# Patient Record
Sex: Female | Born: 1990
Health system: Southern US, Community
[De-identification: ages and names within clinical notes are randomized; demographics above are authoritative.]

## PROBLEM LIST (undated history)

## (undated) ENCOUNTER — Inpatient Hospital Stay (HOSPITAL_COMMUNITY): Payer: Self-pay

## (undated) DIAGNOSIS — F32A Depression, unspecified: Secondary | ICD-10-CM

## (undated) DIAGNOSIS — D219 Benign neoplasm of connective and other soft tissue, unspecified: Secondary | ICD-10-CM

## (undated) DIAGNOSIS — F329 Major depressive disorder, single episode, unspecified: Secondary | ICD-10-CM

## (undated) DIAGNOSIS — A599 Trichomoniasis, unspecified: Secondary | ICD-10-CM

## (undated) DIAGNOSIS — R87629 Unspecified abnormal cytological findings in specimens from vagina: Secondary | ICD-10-CM

## (undated) DIAGNOSIS — J45909 Unspecified asthma, uncomplicated: Secondary | ICD-10-CM

---
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
## 2009-03-20 ENCOUNTER — Emergency Department (HOSPITAL_COMMUNITY): Admission: EM | Admit: 2009-03-20 | Discharge: 2009-03-20 | Payer: Self-pay | Admitting: Family Medicine

## 2009-03-27 ENCOUNTER — Ambulatory Visit (HOSPITAL_COMMUNITY): Admission: RE | Admit: 2009-03-27 | Discharge: 2009-03-27 | Payer: Self-pay | Admitting: Obstetrics & Gynecology

## 2009-09-12 ENCOUNTER — Emergency Department (HOSPITAL_COMMUNITY): Admission: EM | Admit: 2009-09-12 | Discharge: 2009-09-12 | Payer: Self-pay | Admitting: Emergency Medicine

## 2010-03-21 LAB — URINE MICROSCOPIC-ADD ON

## 2010-03-21 LAB — URINE CULTURE
Colony Count: NO GROWTH
Culture  Setup Time: 201109071811
Culture: NO GROWTH

## 2010-03-21 LAB — URINALYSIS, ROUTINE W REFLEX MICROSCOPIC
Glucose, UA: NEGATIVE mg/dL
Hgb urine dipstick: NEGATIVE
Ketones, ur: 15 mg/dL — AB
Nitrite: NEGATIVE
Protein, ur: NEGATIVE mg/dL
Specific Gravity, Urine: 1.02 (ref 1.005–1.030)
Urobilinogen, UA: 2 mg/dL — ABNORMAL HIGH (ref 0.0–1.0)
pH: 6 (ref 5.0–8.0)

## 2010-03-21 LAB — WET PREP, GENITAL
Clue Cells Wet Prep HPF POC: NONE SEEN
Yeast Wet Prep HPF POC: NONE SEEN

## 2010-03-21 LAB — POCT PREGNANCY, URINE: Preg Test, Ur: NEGATIVE

## 2010-03-21 LAB — GC/CHLAMYDIA PROBE AMP, GENITAL
Chlamydia, DNA Probe: NEGATIVE
GC Probe Amp, Genital: POSITIVE — AB

## 2010-03-31 LAB — POCT URINALYSIS DIP (DEVICE)
Bilirubin Urine: NEGATIVE
Glucose, UA: NEGATIVE mg/dL
Hgb urine dipstick: NEGATIVE
Ketones, ur: NEGATIVE mg/dL
Nitrite: NEGATIVE
Protein, ur: 100 mg/dL — AB
Specific Gravity, Urine: 1.015 (ref 1.005–1.030)
Urobilinogen, UA: 1 mg/dL (ref 0.0–1.0)
pH: 8.5 — ABNORMAL HIGH (ref 5.0–8.0)

## 2010-03-31 LAB — POCT PREGNANCY, URINE
Preg Test, Ur: NEGATIVE
Preg Test, Ur: NEGATIVE

## 2010-04-11 LAB — POCT I-STAT, CHEM 8
BUN: 5 mg/dL — ABNORMAL LOW (ref 6–23)
Calcium, Ion: 1.18 mmol/L (ref 1.12–1.32)
Chloride: 99 mEq/L (ref 96–112)
Creatinine, Ser: 0.8 mg/dL (ref 0.4–1.2)
Glucose, Bld: 93 mg/dL (ref 70–99)
HCT: 34 % — ABNORMAL LOW (ref 36.0–46.0)
Hemoglobin: 11.6 g/dL — ABNORMAL LOW (ref 12.0–15.0)
Potassium: 3.7 mEq/L (ref 3.5–5.1)
Sodium: 138 mEq/L (ref 135–145)
TCO2: 26 mmol/L (ref 0–100)

## 2010-04-11 LAB — POCT URINALYSIS DIP (DEVICE)
Bilirubin Urine: NEGATIVE
Glucose, UA: NEGATIVE mg/dL
Ketones, ur: NEGATIVE mg/dL
Nitrite: NEGATIVE
Protein, ur: 30 mg/dL — AB
Specific Gravity, Urine: 1.015 (ref 1.005–1.030)
Urobilinogen, UA: 1 mg/dL (ref 0.0–1.0)
pH: 6 (ref 5.0–8.0)

## 2010-04-11 LAB — POCT PREGNANCY, URINE: Preg Test, Ur: NEGATIVE

## 2010-05-05 ENCOUNTER — Inpatient Hospital Stay (HOSPITAL_COMMUNITY)
Admission: AD | Admit: 2010-05-05 | Discharge: 2010-05-05 | Discharge status: Against Medical Advice | Payer: Medicaid Other | Source: Ambulatory Visit | Attending: Obstetrics | Admitting: Obstetrics

## 2010-05-05 DIAGNOSIS — N912 Amenorrhea, unspecified: Secondary | ICD-10-CM | POA: Insufficient documentation

## 2010-05-05 DIAGNOSIS — R110 Nausea: Secondary | ICD-10-CM | POA: Insufficient documentation

## 2010-05-05 LAB — POCT PREGNANCY, URINE: Preg Test, Ur: NEGATIVE

## 2010-05-05 LAB — URINALYSIS, ROUTINE W REFLEX MICROSCOPIC
Bilirubin Urine: NEGATIVE
Glucose, UA: NEGATIVE mg/dL
Hgb urine dipstick: NEGATIVE
Ketones, ur: NEGATIVE mg/dL
Nitrite: NEGATIVE
Protein, ur: NEGATIVE mg/dL
Specific Gravity, Urine: 1.02 (ref 1.005–1.030)
Urobilinogen, UA: 1 mg/dL (ref 0.0–1.0)
pH: 7 (ref 5.0–8.0)
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
## 2010-05-24 NOTE — Discharge Summary (Signed)
Kathy Flores, Kathy Flores             ACCOUNT NO.:  [id number]   MEDICAL RECORD NO.:  [id number]          PATIENT TYPE:  INP   LOCATION:  9112                          [facility]ACILITY:  [facility]   PHYSICIAN:  Conni Elliot, M.D.DATE OF BIRTH:  [birth year]   DATE OF ADMISSION:  11/11/2004  DATE OF DISCHARGE:  11/15/2004                                 DISCHARGE SUMMARY   DISCHARGE DIAGNOSES:  1.  Intrauterine pregnancy, term.  Normal spontaneous vaginal delivery at 37      and 6 weeks.  2.  Induction of labor for preeclampsia.   DISCHARGE MEDICATIONS:  None.   CONSULTS AND PROCEDURES:  None.   BRIEF HOSPITAL COURSE:  Patient is a 20 year old African-American female  patient who was seen at [facility] who came into the MAU after having  elevated blood pressures noted in the clinic.  She did state that she had  been having some headaches and she was admitted to antenatal for probable  preeclampsia and rule out preeclampsia laboratories.  PIH laboratories were  performed which noted to have a 24-hour urine which noted to have a volume  of 9.3 L with less than 6 g of protein.  However, she continued to have  elevated blood pressures and severe headache during admission during  antenatal for one day with blood pressures ranging in 150s/90s.  It was  decided to go ahead and induce the patient on November 12, 2004 for  preeclampsia.  Patient was induced with Cervidil inserted at 12:00 on the  7th and the Cervidil was removed at midnight.  It was noted that the patient  had one slight elevated temperature; however, this was most likely secondary  due to the prostaglandin induction.  Patient was started on magnesium as  well as low dose Pitocin for continuation of induction of labor.  Patient  went on to a spontaneous vaginal delivery at 1310 on November 13, 2004 of a  viable female infant with Apgars of 9 and 9.  She had a normal spontaneous  delivery of her placenta with a three vessel cord that  was intact.  She had  estimated blood loss of less than 300 mL.  She had no lacerations.  Patient  and infant were stable and patient was transferred to the adult ICU for  continuation of her magnesium for 24 hours after delivery of her baby.  Baby  was sent to the newborn nursery without complications.  Please see the  baby's discharge note.  Patient was continued in the ICU for approximately  36 hours.  She diuresed well and magnesium levels were continued at  therapeutic levels.  Patient denied any symptoms of pain or headache.  We  did note to have elevated blood pressures on day prior to discharge with the  greatest at 170/108 and labetalol was given.  However, after the medicines  her blood pressures continued to be in the 150s systolic and 80s diastolic.  Patient was transferred out of the ICU on November 15, 2004.  Her blood  pressures remained stable.  Patient denied any pain, any bleeding, no  headaches.  Reflexes were continued to be hyperreflexic.  Patient tolerated  hospitalization well and is now stable and ready for discharge.  Patient  will be followed up in [facility] in six weeks.  Patient was instructed  to occasionally have her blood pressure checked in between now and the six-  week check-up with noted to document blood pressures.  If she notes any  signs of headache she is to inform [facility] or the GYN Clinic for  further instructions.  Patient is rubella non-immune and was given a rubella  injection prior to discharge from the hospital.  She was GBS positive and  was treated with antibiotics throughout the delivery with penicillin.  She  was HIV nonreactive.  She was O+, negative antibody and her RPR was  nonreactive.  She was hepatitis B surface antigen negative.  Discharging  blood pressure were slightly elevated in the 140s-150s systolic and  diastolics in the 80s.  The patient will be advised to follow up in the GYN  Clinic for a  nurse's visit just for  monitoring of her blood pressures in one week.  Patient was seen by social work due to the fact that she is a teenage mom.  She has a maternal grandmother for support and she was given referral  information for community resources.      Barth Kirks, M.D.    ______________________________  Conni Elliot, M.D.    MB/MEDQ  D:  11/15/2004  T:  11/15/2004  Job:  [phone] [id number]

## 2010-07-15 ENCOUNTER — Emergency Department (HOSPITAL_COMMUNITY)
Admission: EM | Admit: 2010-07-15 | Discharge: 2010-07-16 | Disposition: A | Discharge status: Home / Self Care | Payer: Medicaid Other | Attending: Emergency Medicine | Admitting: Emergency Medicine

## 2010-07-15 DIAGNOSIS — H109 Unspecified conjunctivitis: Secondary | ICD-10-CM | POA: Insufficient documentation

## 2010-07-15 DIAGNOSIS — R599 Enlarged lymph nodes, unspecified: Secondary | ICD-10-CM | POA: Insufficient documentation

## 2010-07-15 DIAGNOSIS — J029 Acute pharyngitis, unspecified: Secondary | ICD-10-CM | POA: Insufficient documentation

## 2010-07-15 DIAGNOSIS — H5789 Other specified disorders of eye and adnexa: Secondary | ICD-10-CM | POA: Insufficient documentation

## 2010-07-15 DIAGNOSIS — H11419 Vascular abnormalities of conjunctiva, unspecified eye: Secondary | ICD-10-CM | POA: Insufficient documentation

## 2010-07-15 DIAGNOSIS — R509 Fever, unspecified: Secondary | ICD-10-CM | POA: Insufficient documentation

## 2010-07-15 DIAGNOSIS — R51 Headache: Secondary | ICD-10-CM | POA: Insufficient documentation

## 2010-07-16 LAB — RAPID STREP SCREEN (MED CTR MEBANE ONLY): Streptococcus, Group A Screen (Direct): NEGATIVE

## 2010-11-04 ENCOUNTER — Emergency Department (HOSPITAL_COMMUNITY): Payer: Medicaid Other

## 2010-11-04 ENCOUNTER — Emergency Department (HOSPITAL_COMMUNITY)
Admission: EM | Admit: 2010-11-04 | Discharge: 2010-11-05 | Disposition: A | Discharge status: Home / Self Care | Payer: Medicaid Other | Attending: Emergency Medicine | Admitting: Emergency Medicine

## 2010-11-04 DIAGNOSIS — R059 Cough, unspecified: Secondary | ICD-10-CM | POA: Insufficient documentation

## 2010-11-04 DIAGNOSIS — R05 Cough: Secondary | ICD-10-CM | POA: Insufficient documentation

## 2010-11-04 DIAGNOSIS — J40 Bronchitis, not specified as acute or chronic: Secondary | ICD-10-CM | POA: Insufficient documentation

## 2011-02-01 ENCOUNTER — Encounter (HOSPITAL_COMMUNITY): Payer: Self-pay | Admitting: *Deleted

## 2011-02-01 ENCOUNTER — Emergency Department (HOSPITAL_COMMUNITY)
Admission: EM | Admit: 2011-02-01 | Discharge: 2011-02-01 | Disposition: A | Discharge status: Home / Self Care | Payer: Medicaid Other | Attending: Emergency Medicine | Admitting: Emergency Medicine

## 2011-02-01 DIAGNOSIS — R079 Chest pain, unspecified: Secondary | ICD-10-CM | POA: Insufficient documentation

## 2011-02-01 DIAGNOSIS — R197 Diarrhea, unspecified: Secondary | ICD-10-CM | POA: Insufficient documentation

## 2011-02-01 DIAGNOSIS — R112 Nausea with vomiting, unspecified: Secondary | ICD-10-CM

## 2011-02-01 DIAGNOSIS — R109 Unspecified abdominal pain: Secondary | ICD-10-CM | POA: Insufficient documentation

## 2011-02-01 LAB — PREGNANCY, URINE: Preg Test, Ur: NEGATIVE

## 2011-02-01 MED ORDER — PANTOPRAZOLE SODIUM 40 MG PO TBEC: 40.0000 mg | DELAYED_RELEASE_TABLET | Freq: Every day | ORAL | Status: DC

## 2011-02-01 MED ORDER — ONDANSETRON 4 MG PO TBDP
8.0000 mg | ORAL_TABLET | Freq: Once | ORAL | Status: AC
  Administered 2011-02-01: 8 mg via ORAL
  Filled 2011-02-01: qty 2

## 2011-02-01 MED ORDER — ONDANSETRON HCL 8 MG PO TABS: 8.0000 mg | ORAL_TABLET | Freq: Three times a day (TID) | ORAL | Status: AC | PRN

## 2011-02-01 NOTE — ED Notes (Signed)
I gave the patient a cup of ice and a sprite. 

## 2011-02-01 NOTE — ED Provider Notes (Addendum)
History     CSN: [id number]  Arrival date & time 02/01/11  1612   First MD Initiated Contact with Patient 02/01/11 1744      Chief Complaint  Patient presents with  . Nausea  . Abdominal Pain  . Diarrhea  . Chest Pain    (Consider location/radiation/quality/duration/timing/severity/associated sxs/prior treatment) Patient is a 21 y.o. female presenting with abdominal pain, diarrhea, and chest pain. The history is provided by the patient.  Abdominal Pain The primary symptoms of the illness include abdominal pain and diarrhea. The primary symptoms of the illness do not include fever or shortness of breath.  Symptoms associated with the illness do not include chills or back pain.  Diarrhea The primary symptoms include abdominal pain and diarrhea. Primary symptoms do not include fever or rash.  The illness does not include chills or back pain.  Chest Pain Primary symptoms include abdominal pain. Pertinent negatives for primary symptoms include no fever and no shortness of breath.   pt c/o nvd onset last pm. Few episodes of each. Emesis clear, not bloody or bilious. Diarrhea loose to watery, not bloody. Mid abd crampy pain comes and goes, no constant or focal abd pain. No radiation. No specific exacerbating or alleviating factors.  No prior abd surgery. States her normal period started yesterday. No unusual vaginal discharge or bleeding. States w normal period will get cramping but not nvd. No known ill contacts or bad food ingestion. No recent new meds or abx use. No fever or chills. No gu c/o. No back or flank pain.   History reviewed. No pertinent past medical history.  History reviewed. No pertinent past surgical history.  No family history on file.  History  Substance Use Topics  . Smoking status: Current Everyday Smoker  . Smokeless tobacco: Not on file  . Alcohol Use: No    OB History    Grav Para Term Preterm Abortions TAB SAB Ect Mult Living                  Review  of Systems  Constitutional: Negative for fever and chills.  HENT: Negative for neck pain.   Eyes: Negative for redness.  Respiratory: Negative for shortness of breath.   Cardiovascular: Negative for chest pain and leg swelling.  Gastrointestinal: Positive for abdominal pain and diarrhea.  Genitourinary: Negative for flank pain.  Musculoskeletal: Negative for back pain.  Skin: Negative for rash.  Neurological: Negative for headaches.  Hematological: Does not bruise/bleed easily.  Psychiatric/Behavioral: Negative for confusion.    Allergies  Penicillins  Home Medications  No current outpatient prescriptions on file.  BP 112/66  Pulse 97  Temp(Src) 99.1 F (37.3 C) (Oral)  Resp 16  Ht 5\' 5"  (1.651 m)  Wt 165 lb (74.844 kg)  BMI 27.46 kg/m2  SpO2 99%  Physical Exam  Nursing note and vitals reviewed. Constitutional: She is oriented to person, place, and time. She appears well-developed and well-nourished. No distress.  Eyes: Conjunctivae are normal. No scleral icterus.  Neck: Normal range of motion. Neck supple. No tracheal deviation present.       No stiffness or rigidity  Cardiovascular: Normal rate, regular rhythm, normal heart sounds and intact distal pulses.  Exam reveals no gallop and no friction rub.   No murmur heard. Pulmonary/Chest: Effort normal and breath sounds normal. No respiratory distress.  Abdominal: Soft. Normal appearance and bowel sounds are normal. She exhibits no distension and no mass. There is no tenderness. There is no rebound and  no guarding.  Genitourinary:       No cva tenderness  Musculoskeletal: She exhibits no edema and no tenderness.  Neurological: She is alert and oriented to person, place, and time.  Skin: Skin is warm and dry. No rash noted.  Psychiatric: She has a normal mood and affect.    ED Course  Procedures (including critical care time)  Results for orders placed during the hospital encounter of 02/01/11  PREGNANCY, URINE       Component Value Range   Preg Test, Ur NEGATIVE  NEGATIVE       MDM  zofran po. Po fluids.   Tolerating po fluids. No nvd in ed. abd soft nt.   Recheck tolerating po fluids. Requests d/c. Pt also states has hx reflux and requests med for same.     Suzi Roots, MD 02/01/11 [place]  Suzi Roots, MD 02/01/11 [phone]

## 2011-02-01 NOTE — ED Notes (Signed)
Patient reports she has nausea/vomitting, diarrhea, stomach pain, chest pain, and she is on her period.  Her sx started last night

## 2011-06-06 ENCOUNTER — Emergency Department (HOSPITAL_COMMUNITY)
Admission: EM | Admit: 2011-06-06 | Discharge: 2011-06-06 | Disposition: A | Discharge status: Home / Self Care | Payer: Self-pay | Attending: Emergency Medicine | Admitting: Emergency Medicine

## 2011-06-06 ENCOUNTER — Encounter (HOSPITAL_COMMUNITY): Payer: Self-pay | Admitting: Emergency Medicine

## 2011-06-06 DIAGNOSIS — N72 Inflammatory disease of cervix uteri: Secondary | ICD-10-CM | POA: Insufficient documentation

## 2011-06-06 DIAGNOSIS — N390 Urinary tract infection, site not specified: Secondary | ICD-10-CM | POA: Insufficient documentation

## 2011-06-06 LAB — WET PREP, GENITAL

## 2011-06-06 LAB — URINALYSIS, ROUTINE W REFLEX MICROSCOPIC
Bilirubin Urine: NEGATIVE
Hgb urine dipstick: NEGATIVE
Nitrite: NEGATIVE
Specific Gravity, Urine: 1.021 (ref 1.005–1.030)
Urobilinogen, UA: 0.2 mg/dL (ref 0.0–1.0)
pH: 7.5 (ref 5.0–8.0)

## 2011-06-06 LAB — URINE MICROSCOPIC-ADD ON

## 2011-06-06 LAB — POCT PREGNANCY, URINE: Preg Test, Ur: NEGATIVE

## 2011-06-06 MED ORDER — LIDOCAINE HCL (PF) 1 % IJ SOLN
INTRAMUSCULAR | Status: AC
  Administered 2011-06-06: 15:00:00
  Filled 2011-06-06: qty 5

## 2011-06-06 MED ORDER — AZITHROMYCIN 250 MG PO TABS
1000.0000 mg | ORAL_TABLET | Freq: Once | ORAL | Status: AC
  Administered 2011-06-06: 1000 mg via ORAL
  Filled 2011-06-06: qty 4

## 2011-06-06 MED ORDER — CEFTRIAXONE SODIUM 250 MG IJ SOLR
250.0000 mg | Freq: Once | INTRAMUSCULAR | Status: AC
  Administered 2011-06-06: 250 mg via INTRAMUSCULAR
  Filled 2011-06-06: qty 250

## 2011-06-06 MED ORDER — METRONIDAZOLE 500 MG PO TABS
2000.0000 mg | ORAL_TABLET | Freq: Once | ORAL | Status: AC
  Administered 2011-06-06: 2000 mg via ORAL
  Filled 2011-06-06: qty 4

## 2011-06-06 MED ORDER — SULFAMETHOXAZOLE-TRIMETHOPRIM 800-160 MG PO TABS: 1.0000 | ORAL_TABLET | Freq: Two times a day (BID) | ORAL | Status: AC

## 2011-06-06 NOTE — ED Provider Notes (Signed)
Medical screening examination/treatment/procedure(s) were performed by non-physician practitioner and as supervising physician I was immediately available for consultation/collaboration.  Johan Creveling R. Brylen Wagar, MD 06/06/11 1600 

## 2011-06-06 NOTE — ED Notes (Signed)
Onset two weeks ago LLQ abdominal pain with dysuria and vaginal discharge. Pain 5/10 achy dull

## 2011-06-06 NOTE — ED Provider Notes (Signed)
History     CSN: [id number]  Arrival date & time 06/06/11  1246   First MD Initiated Contact with Patient 06/06/11 1259      Chief Complaint  Patient presents with  . Abdominal Pain    (Consider location/radiation/quality/duration/timing/severity/associated sxs/prior treatment) Patient is a 21 y.o. female presenting with abdominal pain. The history is provided by the patient.  Abdominal Pain The primary symptoms of the illness include abdominal pain, dysuria and vaginal discharge. The primary symptoms of the illness do not include fever, nausea, vomiting, diarrhea or vaginal bleeding. The current episode started more than 2 days ago. The onset of the illness was gradual. The problem has been gradually worsening.  The dysuria is associated with frequency.  The vaginal discharge is associated with dysuria.   Additional symptoms associated with the illness include frequency. Symptoms associated with the illness do not include chills or back pain.  Pt states symptoms progressively worsening over last 2 weeks. Reports white vaginal discharge, pain with urination, urinary frequency. Pain suprapubic and left lower quadrant. Denies fever, chills, vomiting, diarrhea  History reviewed. No pertinent past medical history.  History reviewed. No pertinent past surgical history.  No family history on file.  History  Substance Use Topics  . Smoking status: Current Everyday Smoker  . Smokeless tobacco: Not on file  . Alcohol Use: No    OB History    Grav Para Term Preterm Abortions TAB SAB Ect Mult Living                  Review of Systems  Constitutional: Negative for fever and chills.  Respiratory: Negative.   Cardiovascular: Negative.   Gastrointestinal: Positive for abdominal pain. Negative for nausea, vomiting and diarrhea.  Genitourinary: Positive for dysuria, frequency and vaginal discharge. Negative for vaginal bleeding.  Musculoskeletal: Negative for back pain.  Skin:  Negative.   Neurological: Negative for dizziness and weakness.  Psychiatric/Behavioral: Negative.     Allergies  Penicillins  Home Medications  No current outpatient prescriptions on file.  BP 120/69  Pulse 84  Temp(Src) 98.8 F (37.1 C) (Oral)  Resp 16  SpO2 98%  Physical Exam  Nursing note and vitals reviewed. Constitutional: She is oriented to person, place, and time. She appears well-developed and well-nourished. No distress.  HENT:  Head: Normocephalic.  Eyes: Conjunctivae are normal.  Neck: Neck supple.  Cardiovascular: Normal rate, regular rhythm and normal heart sounds.   Pulmonary/Chest: Breath sounds normal. No respiratory distress. She has no wheezes. She has no rales.  Abdominal: Soft. Bowel sounds are normal. She exhibits no distension.       Suprapubic and left lower quadrant tenderness. No guarding, no rebound tenderness  Genitourinary:       Normal external genitalia. White vaginal discharge, Cervix normal. No CMT. Uterine and left adnexal  tenderness  Neurological: She is alert and oriented to person, place, and time.  Skin: Skin is warm and dry.  Psychiatric: She has a normal mood and affect.    ED Course  Procedures (including critical care time)  Pt with urinary symptoms and vaginal discharge. Chart review shows pt has hx of STI s and UTIs. Will get ua and do pelvic exam. Abdomen soft, mild tenderness in LLQ but no guarding, no rebound.   Results for orders placed during the hospital encounter of 06/06/11  URINALYSIS, ROUTINE W REFLEX MICROSCOPIC      Component Value Range   Color, Urine YELLOW  YELLOW    APPearance CLEAR  CLEAR    Specific Gravity, Urine 1.021  1.005 - 1.030    pH 7.5  5.0 - 8.0    Glucose, UA NEGATIVE  NEGATIVE (mg/dL)   Hgb urine dipstick NEGATIVE  NEGATIVE    Bilirubin Urine NEGATIVE  NEGATIVE    Ketones, ur NEGATIVE  NEGATIVE (mg/dL)   Protein, ur NEGATIVE  NEGATIVE (mg/dL)   Urobilinogen, UA 0.2  0.0 - 1.0 (mg/dL)    Nitrite NEGATIVE  NEGATIVE    Leukocytes, UA LARGE (*) NEGATIVE   POCT PREGNANCY, URINE      Component Value Range   Preg Test, Ur NEGATIVE  NEGATIVE   WET PREP, GENITAL      Component Value Range   Yeast Wet Prep HPF POC NONE SEEN  NONE SEEN    Trich, Wet Prep FEW (*) NONE SEEN    Clue Cells Wet Prep HPF POC FEW (*) NONE SEEN    WBC, Wet Prep HPF POC MANY (*) NONE SEEN   URINE MICROSCOPIC-ADD ON      Component Value Range   Squamous Epithelial / LPF FEW (*) RARE    WBC, UA 11-20  <3 (WBC/hpf)   RBC / HPF 0-2  <3 (RBC/hpf)   Bacteria, UA RARE  RARE    No results found.  Pt with possible UTI and cervicitis. WIll treat with flagyl, rocephin, zithromax for trich and Gonorrhea/chlamydia. Cultures sent. Will d/c home with antibiotic for UTI. Will recheck at health dept or PCP. Abdomen remains soft, mild left adnexal tenderness. Pt however is afebrile and non toxic, doubt TOA or PID, will follow up or return if worsening.   1. Cervicitis   2. UTI (lower urinary tract infection)       MDM          Lottie Mussel, PA 06/06/11 1515

## 2011-06-06 NOTE — ED Notes (Signed)
Pt states it hurts too bad to void. In & out cath ordered per pt request

## 2011-06-06 NOTE — ED Notes (Signed)
Pt request pelvic exam while here in ED

## 2011-06-06 NOTE — ED Notes (Signed)
Patient requesting HIV Aids test.

## 2011-06-06 NOTE — Discharge Instructions (Signed)
You were treated today for possible vaginal infection. You also have a urinary tract infection. Take antibiotic that was prescribed until all gone. Follow up with health dept or primary care doctor for recheck in 1 week. No intercourse for a week.   Cervicitis Cervicitis is a soreness and swelling (inflammation) of the cervix. Your cervix is located at the bottom of your uterus which opens up to the vagina.  CAUSES   Sexually transmitted infections (STIs).   Allergic reaction.   Medicines or birth control devices that are put in the vagina.   Injury to the cervix.   Bacterial infections.  SYMPTOMS  There may be no symptoms. If symptoms occur, they may include:  Grey, white, yellow, or bad smelling vaginal discharge.   Pain or itching of the area outside the vagina.   Painful sexual intercourse.   Lower abdominal or lower back pain, especially during intercourse.   Frequent urination.   Abnormal vaginal bleeding between periods, after sexual intercourse, or after menopause.   Pressure or a heavy feeling in the pelvis.  DIAGNOSIS  Diagnosis is made after a pelvic exam. Other tests may include:  Examination of any discharge under a microscope (wet prep).   A Pap test.  TREATMENT  Treatment will depend on the cause of cervicitis. If it is caused by an STI, both you and your partner will need to be treated. Antibiotic medicines will be given. HOME CARE INSTRUCTIONS   Do not have sexual intercourse until your caregiver says it is okay.   Do not have sexual intercourse until your partner has been treated if your cervicitis is caused by an STI.   Take your antibiotics as directed. Finish them even if you start to feel better.  SEEK IMMEDIATE MEDICAL CARE IF:   Your symptoms come back.   You have a fever.   You experience any problems that may be related to the medicine you are taking.  MAKE SURE YOU:   Understand these instructions.   Will watch your condition.    Will get help right away if you are not doing well or get worse.  Document Released: 12/23/2004 Document Revised: 12/12/2010 Document Reviewed: 07/22/2010 [facility] Patient Information 2012 [place], [place].

## 2011-06-07 LAB — GC/CHLAMYDIA PROBE AMP, GENITAL
Chlamydia, DNA Probe: NEGATIVE
GC Probe Amp, Genital: NEGATIVE

## 2013-04-25 LAB — OB RESULTS CONSOLE GC/CHLAMYDIA: Chlamydia: NEGATIVE

## 2013-04-25 LAB — OB RESULTS CONSOLE ABO/RH: RH TYPE: POSITIVE

## 2013-04-25 LAB — OB RESULTS CONSOLE HEPATITIS B SURFACE ANTIGEN: HEP B S AG: NEGATIVE

## 2013-04-25 LAB — OB RESULTS CONSOLE RUBELLA ANTIBODY, IGM: Rubella: IMMUNE

## 2013-04-25 LAB — OB RESULTS CONSOLE ANTIBODY SCREEN: ANTIBODY SCREEN: NEGATIVE

## 2013-04-25 LAB — OB RESULTS CONSOLE RPR: RPR: NONREACTIVE

## 2013-04-25 LAB — OB RESULTS CONSOLE HIV ANTIBODY (ROUTINE TESTING): HIV: NONREACTIVE

## 2013-05-05 DIAGNOSIS — D259 Leiomyoma of uterus, unspecified: Secondary | ICD-10-CM | POA: Insufficient documentation

## 2013-08-04 ENCOUNTER — Encounter (HOSPITAL_COMMUNITY): Payer: Self-pay

## 2013-08-04 ENCOUNTER — Inpatient Hospital Stay (HOSPITAL_COMMUNITY)
Admission: AD | Admit: 2013-08-04 | Discharge: 2013-08-04 | Disposition: A | Discharge status: Home / Self Care | Payer: Medicaid Other | Source: Ambulatory Visit | Attending: Obstetrics and Gynecology | Admitting: Obstetrics and Gynecology

## 2013-08-04 DIAGNOSIS — O9989 Other specified diseases and conditions complicating pregnancy, childbirth and the puerperium: Principal | ICD-10-CM

## 2013-08-04 DIAGNOSIS — O99330 Smoking (tobacco) complicating pregnancy, unspecified trimester: Secondary | ICD-10-CM | POA: Diagnosis not present

## 2013-08-04 DIAGNOSIS — R0602 Shortness of breath: Secondary | ICD-10-CM | POA: Diagnosis present

## 2013-08-04 DIAGNOSIS — O99891 Other specified diseases and conditions complicating pregnancy: Secondary | ICD-10-CM | POA: Diagnosis not present

## 2013-08-04 DIAGNOSIS — J45909 Unspecified asthma, uncomplicated: Secondary | ICD-10-CM | POA: Insufficient documentation

## 2013-08-04 DIAGNOSIS — J4540 Moderate persistent asthma, uncomplicated: Secondary | ICD-10-CM

## 2013-08-04 MED ORDER — BUDESONIDE 180 MCG/ACT IN AEPB: 1.0000 | INHALATION_SPRAY | Freq: Two times a day (BID) | RESPIRATORY_TRACT | Status: DC

## 2013-08-04 NOTE — MAU Note (Signed)
Pt presents complaining of shortness of breath that has been going on for a month. History of asthma but inhaler doesn't help. States she believes it is anxiety. Denies vaginal bleeding or discharge. Reports good fetal movement.

## 2013-08-04 NOTE — MAU Provider Note (Signed)
Attestation of Attending Supervision of Advanced Practitioner (CNM/NP): Evaluation and management procedures were performed by the Advanced Practitioner under my supervision and collaboration.  I have reviewed the Advanced Practitioner's note and chart, and I agree with the management and plan.  Francella Barnett 08/04/2013 8:39 PM

## 2013-08-04 NOTE — Discharge Instructions (Signed)
Asthma Attack Prevention Although there is no way to prevent asthma from starting, you can take steps to control the disease and reduce its symptoms. Learn about your asthma and how to control it. Take an active role to control your asthma by working with your health care provider to create and follow an asthma action plan. An asthma action plan guides you in:  Taking your medicines properly.  Avoiding things that set off your asthma or make your asthma worse (asthma triggers).  Tracking your level of asthma control.  Responding to worsening asthma.  Seeking emergency care when needed. To track your asthma, keep records of your symptoms, check your peak flow number using a handheld device that shows how well air moves out of your lungs (peak flow meter), and get regular asthma checkups.  WHAT ARE SOME WAYS TO PREVENT AN ASTHMA ATTACK?  Take medicines as directed by your health care provider.  Keep track of your asthma symptoms and level of control.  With your health care provider, write a detailed plan for taking medicines and managing an asthma attack. Then be sure to follow your action plan. Asthma is an ongoing condition that needs regular monitoring and treatment.  Identify and avoid asthma triggers. Many outdoor allergens and irritants (such as pollen, mold, cold air, and air pollution) can trigger asthma attacks. Find out what your asthma triggers are and take steps to avoid them.  Monitor your breathing. Learn to recognize warning signs of an attack, such as coughing, wheezing, or shortness of breath. Your lung function may decrease before you notice any signs or symptoms, so regularly measure and record your peak airflow with a home peak flow meter.  Identify and treat attacks early. If you act quickly, you are less likely to have a severe attack. You will also need less medicine to control your symptoms. When your peak flow measurements decrease and alert you to an upcoming attack,  take your medicine as instructed and immediately stop any activity that may have triggered the attack. If your symptoms do not improve, get medical help.  Pay attention to increasing quick-relief inhaler use. If you find yourself relying on your quick-relief inhaler, your asthma is not under control. See your health care provider about adjusting your treatment. WHAT CAN MAKE MY SYMPTOMS WORSE? A number of common things can set off or make your asthma symptoms worse and cause temporary increased inflammation of your airways. Keep track of your asthma symptoms for several weeks, detailing all the environmental and emotional factors that are linked with your asthma. When you have an asthma attack, go back to your asthma diary to see which factor, or combination of factors, might have contributed to it. Once you know what these factors are, you can take steps to control many of them. If you have allergies and asthma, it is important to take asthma prevention steps at home. Minimizing contact with the substance to which you are allergic will help prevent an asthma attack. Some triggers and ways to avoid these triggers are: Animal Dander:  Some people are allergic to the flakes of skin or dried saliva from animals with fur or feathers.   There is no such thing as a hypoallergenic dog or cat breed. All dogs or cats can cause allergies, even if they don't shed.  Keep these pets out of your home.  If you are not able to keep a pet outdoors, keep the pet out of your bedroom and other sleeping areas at all  times, and keep the door closed.  Remove carpets and furniture covered with cloth from your home. If that is not possible, keep the pet away from fabric-covered furniture and carpets. Dust Mites: Many people with asthma are allergic to dust mites. Dust mites are tiny bugs that are found in every home in mattresses, pillows, carpets, fabric-covered furniture, bedcovers, clothes, stuffed toys, and other  fabric-covered items.   Cover your mattress in a special dust-proof cover.  Cover your pillow in a special dust-proof cover, or wash the pillow each week in hot water. Water must be hotter than 130 F (54.4 C) to kill dust mites. Cold or warm water used with detergent and bleach can also be effective.  Wash the sheets and blankets on your bed each week in hot water.  Try not to sleep or lie on cloth-covered cushions.  Call ahead when traveling and ask for a smoke-free hotel room. Bring your own bedding and pillows in case the hotel only supplies feather pillows and down comforters, which may contain dust mites and cause asthma symptoms.  Remove carpets from your bedroom and those laid on concrete, if you can.  Keep stuffed toys out of the bed, or wash the toys weekly in hot water or cooler water with detergent and bleach. Cockroaches: Many people with asthma are allergic to the droppings and remains of cockroaches.   Keep food and garbage in closed containers. Never leave food out.  Use poison baits, traps, powders, gels, or paste (for example, boric acid).  If a spray is used to kill cockroaches, stay out of the room until the odor goes away. Indoor Mold:  Fix leaky faucets, pipes, or other sources of water that have mold around them.  Clean floors and moldy surfaces with a fungicide or diluted bleach.  Avoid using humidifiers, vaporizers, or swamp coolers. These can spread molds through the air. Pollen and Outdoor Mold:  When pollen or mold spore counts are high, try to keep your windows closed.  Stay indoors with windows closed from late morning to afternoon. Pollen and some mold spore counts are highest at that time.  Ask your health care provider whether you need to take anti-inflammatory medicine or increase your dose of the medicine before your allergy season starts. Other Irritants to Avoid:  Tobacco smoke is an irritant. If you smoke, ask your health care provider how  you can quit. Ask family members to quit smoking, too. Do not allow smoking in your home or car.  If possible, do not use a wood-burning stove, kerosene heater, or fireplace. Minimize exposure to all sources of smoke, including incense, candles, fires, and fireworks.  Try to stay away from strong odors and sprays, such as perfume, talcum powder, hair spray, and paints.  Decrease humidity in your home and use an indoor air cleaning device. Reduce indoor humidity to below 60%. Dehumidifiers or central air conditioners can do this.  Decrease house dust exposure by changing furnace and air cooler filters frequently.  Try to have someone else vacuum for you once or twice a week. Stay out of rooms while they are being vacuumed and for a short while afterward.  If you vacuum, use a dust mask from a hardware store, a double-layered or microfilter vacuum cleaner bag, or a vacuum cleaner with a HEPA filter.  Sulfites in foods and beverages can be irritants. Do not drink beer or wine or eat dried fruit, processed potatoes, or shrimp if they cause asthma symptoms.  Cold  air can trigger an asthma attack. Cover your nose and mouth with a scarf on cold or windy days.  Several health conditions can make asthma more difficult to manage, including a runny nose, sinus infections, reflux disease, psychological stress, and sleep apnea. Work with your health care provider to manage these conditions.  Avoid close contact with people who have a respiratory infection such as a cold or the flu, since your asthma symptoms may get worse if you catch the infection. Wash your hands thoroughly after touching items that may have been handled by people with a respiratory infection.  Get a flu shot every year to protect against the flu virus, which often makes asthma worse for days or weeks. Also get a pneumonia shot if you have not previously had one. Unlike the flu shot, the pneumonia shot does not need to be given  yearly. Medicines:  Talk to your health care provider about whether it is safe for you to take aspirin or non-steroidal anti-inflammatory medicines (NSAIDs). In a small number of people with asthma, aspirin and NSAIDs can cause asthma attacks. These medicines must be avoided by people who have known aspirin-sensitive asthma. It is important that people with aspirin-sensitive asthma read labels of all over-the-counter medicines used to treat pain, colds, coughs, and fever.  Beta-blockers and ACE inhibitors are other medicines you should discuss with your health care provider. HOW CAN I FIND OUT WHAT I AM ALLERGIC TO? Ask your asthma health care provider about allergy skin testing or blood testing (the RAST test) to identify the allergens to which you are sensitive. If you are found to have allergies, the most important thing to do is to try to avoid exposure to any allergens that you are sensitive to as much as possible. Other treatments for allergies, such as medicines and allergy shots (immunotherapy) are available.  CAN I EXERCISE? Follow your health care provider's advice regarding asthma treatment before exercising. It is important to maintain a regular exercise program, but vigorous exercise or exercise in cold, humid, or dry environments can cause asthma attacks, especially for those people who have exercise-induced asthma. Document Released: 12/11/2008 Document Revised: 12/28/2012 Document Reviewed: 06/30/2012 [facility] Patient Information 2015 [place], [place]. This information is not intended to replace advice given to you by your health care provider. Make sure you discuss any questions you have with your health care provider.  Asthma Asthma is a recurring condition in which the airways tighten and narrow. Asthma can make it difficult to breathe. It can cause coughing, wheezing, and shortness of breath. Asthma episodes, also called asthma attacks, range from minor to life-threatening. Asthma  cannot be cured, but medicines and lifestyle changes can help control it. CAUSES Asthma is believed to be caused by inherited (genetic) and environmental factors, but its exact cause is unknown. Asthma may be triggered by allergens, lung infections, or irritants in the air. Asthma triggers are different for each person. Common triggers include:   Animal dander.  Dust mites.  Cockroaches.  Pollen from trees or grass.  Mold.  Smoke.  Air pollutants such as dust, household cleaners, hair sprays, aerosol sprays, paint fumes, strong chemicals, or strong odors.  Cold air, weather changes, and winds (which increase molds and pollens in the air).  Strong emotional expressions such as crying or laughing hard.  Stress.  Certain medicines (such as aspirin) or types of drugs (such as beta-blockers).  Sulfites in foods and drinks. Foods and drinks that may contain sulfites include dried fruit, potato  chips, and sparkling grape juice. °· Infections or inflammatory conditions such as the flu, a cold, or an inflammation of the nasal membranes (rhinitis). °· Gastroesophageal reflux disease (GERD). °· Exercise or strenuous activity. °SYMPTOMS °Symptoms may occur immediately after asthma is triggered or many hours later. Symptoms include: °· Wheezing. °· Excessive nighttime or early morning coughing. °· Frequent or severe coughing with a common cold. °· Chest tightness. °· Shortness of breath. °DIAGNOSIS  °The diagnosis of asthma is made by a review of your medical history and a physical exam. Tests may also be performed. These may include: °· Lung function studies. These tests show how much air you breathe in and out. °· Allergy tests. °· Imaging tests such as X-rays. °TREATMENT  °Asthma cannot be cured, but it can usually be controlled. Treatment involves identifying and avoiding your asthma triggers. It also involves medicines. There are 2 classes of medicine used for asthma treatment:  °· Controller  medicines. These prevent asthma symptoms from occurring. They are usually taken every day. °· Reliever or rescue medicines. These quickly relieve asthma symptoms. They are used as needed and provide short-term relief. °Your health care provider will help you create an asthma action plan. An asthma action plan is a written plan for managing and treating your asthma attacks. It includes a list of your asthma triggers and how they may be avoided. It also includes information on when medicines should be taken and when their dosage should be changed. An action plan may also involve the use of a device called a peak flow meter. A peak flow meter measures how well the lungs are working. It helps you monitor your condition. °HOME CARE INSTRUCTIONS  °· Take medicines only as directed by your health care provider. Speak with your health care provider if you have questions about how or when to take the medicines. °· Use a peak flow meter as directed by your health care provider. Record and keep track of readings. °· Understand and use the action plan to help minimize or stop an asthma attack without needing to seek medical care. °· Control your home environment in the following ways to help prevent asthma attacks: °¨ Do not smoke. Avoid being exposed to secondhand smoke. °¨ Change your heating and air conditioning filter regularly. °¨ Limit your use of fireplaces and wood stoves. °¨ Get rid of pests (such as roaches and mice) and their droppings. °¨ Throw away plants if you see mold on them. °¨ Clean your floors and dust regularly. Use unscented cleaning products. °¨ Try to have someone else vacuum for you regularly. Stay out of rooms while they are being vacuumed and for a short while afterward. If you vacuum, use a dust mask from a hardware store, a double-layered or microfilter vacuum cleaner bag, or a vacuum cleaner with a HEPA filter. °¨ Replace carpet with wood, tile, or vinyl flooring. Carpet can trap dander and  dust. °¨ Use allergy-proof pillows, mattress covers, and box spring covers. °¨ Wash bed sheets and blankets every week in hot water and dry them in a dryer. °¨ Use blankets that are made of polyester or cotton. °¨ Clean bathrooms and kitchens with bleach. If possible, have someone repaint the walls in these rooms with mold-resistant paint. Keep out of the rooms that are being cleaned and painted. °¨ Wash hands frequently. °SEEK MEDICAL CARE IF:  °· You have wheezing, shortness of breath, or a cough even if taking medicine to prevent attacks. °· The colored mucus   you cough up (sputum) is thicker than usual. °· Your sputum changes from clear or white to yellow, green, gray, or bloody. °· You have any problems that may be related to the medicines you are taking (such as a rash, itching, swelling, or trouble breathing). °· You are using a reliever medicine more than 2-3 times per week. °· Your peak flow is still at 50-79% of your personal best after following your action plan for 1 hour. °· You have a fever. °SEEK IMMEDIATE MEDICAL CARE IF:  °· You seem to be getting worse and are unresponsive to treatment during an asthma attack. °· You are short of breath even at rest. °· You get short of breath when doing very little physical activity. °· You have difficulty eating, drinking, or talking due to asthma symptoms. °· You develop chest pain. °· You develop a fast heartbeat. °· You have a bluish color to your lips or fingernails. °· You are light-headed, dizzy, or faint. °· Your peak flow is less than 50% of your personal best. °MAKE SURE YOU:  °· Understand these instructions. °· Will watch your condition. °· Will get help right away if you are not doing well or get worse. °Document Released: 12/23/2004 Document Revised: 05/09/2013 Document Reviewed: 07/22/2012 °[employer]® Patient Information ©2015 [employer]. This information is not intended to replace advice given to you by your health care provider. Make sure you  discuss any questions you have with your health care provider. ° °

## 2013-08-04 NOTE — MAU Note (Signed)
Urine in lab 

## 2013-08-04 NOTE — MAU Provider Note (Signed)
None     Chief Complaint:  Shortness of Breath   Kathy Flores is  23 y.o. G3P2002 at [redacted]w[redacted]d presents complaining of Shortness of Breath .  She states none contractions are associated with none vaginal bleeding, intact membranes, along with active fetal movement. Pt. Reports frequent use of rescue inhaler with symptoms of shortness of breath. She reports 2-3 times / day use as well as nightly awakenings. She has not been able to follow up with her PCP for control. She was hospitalized last year for "asthma symptoms". She currently denies SOB, CP, Lightheadedness. She has no fever,chills , nausea, or vomiting. She has no other complaints.   Obstetrical/Gynecological History: OB History   Grav Para Term Preterm Abortions TAB SAB Ect Mult Living   3 2 2       2      Past Medical History: History reviewed. No pertinent past medical history.  Past Surgical History: History reviewed. No pertinent past surgical history.  Family History: History reviewed. No pertinent family history.  Social History: History  Substance Use Topics  . Smoking status: Current Every Day Smoker  . Smokeless tobacco: Not on file  . Alcohol Use: No    Allergies:  Allergies  Allergen Reactions  . Penicillins Hives and Rash    Meds:  Prescriptions prior to admission  Medication Sig Dispense Refill  . albuterol (PROVENTIL HFA;VENTOLIN HFA) 108 (90 BASE) MCG/ACT inhaler Inhale 2 puffs into the lungs every 6 (six) hours as needed for wheezing or shortness of breath.        Review of Systems -  Per HPI above.     Physical Exam  Blood pressure 124/66, pulse 89, temperature 98.6 F (37 C), resp. rate 18, weight 94.065 kg (207 lb 6 oz), SpO2 100.00%. GENERAL: Well-developed, well-nourished female in no acute distress. Comfortable.  LUNGS: some mild wheezes audible in upper lung fields bilaterally.  HEART: Regular rate and rhythm. ABDOMEN: Soft, nontender, nondistended, gravid.  EXTREMITIES:  Nontender, no edema, 2+ distal pulses. DTR's 2+   Labs: No results found for this or any previous visit (from the past 24 hour(s)). Imaging Studies:  No results found.  Assessment: Kathy Flores is  23 y.o. G3P2002 at [redacted]w[redacted]d presents with SOB.  Plan: 1. Pt. Classified as Moderate persistent asthma per her symptoms. At this time she needs additional drug therapy for better control.  - Prescribed Budesonide 177mcg qd.  - Pt. Does not need refill on albuterol inhaler at this time per her.  - Follow up with pcp for further evaluation and long term control of asthma symptoms.  - Discussed symptoms of abdominal fullness, and difficulty taking deep breaths as pregnancy progresses. Reminded patient to return to the ED if she experiences any further exacerbation of her symptoms, or prolongation of SOB, or for any other concern.   [place], York Ram 7/30/20154:39 PM   I have seen and examined this patient and agree with above documentation in the resident's note.  - moderate persistent asthma, advised to follow up with PCP and controller med started. Nila Nephew, MD OB Fellow 08/04/2013 8:07 PM

## 2013-08-18 ENCOUNTER — Other Ambulatory Visit: Payer: Self-pay | Admitting: Obstetrics & Gynecology

## 2013-10-20 LAB — OB RESULTS CONSOLE GC/CHLAMYDIA
Chlamydia: POSITIVE
GC PROBE AMP, GENITAL: NEGATIVE

## 2013-10-20 LAB — OB RESULTS CONSOLE GBS: GBS: POSITIVE

## 2013-11-07 ENCOUNTER — Encounter (HOSPITAL_COMMUNITY): Payer: Self-pay

## 2013-11-13 ENCOUNTER — Encounter (HOSPITAL_COMMUNITY): Payer: Self-pay

## 2013-11-13 ENCOUNTER — Inpatient Hospital Stay (HOSPITAL_COMMUNITY)
Admission: AD | Admit: 2013-11-13 | Discharge: 2013-11-13 | Disposition: A | Discharge status: Home / Self Care | Payer: Medicaid Other | Source: Ambulatory Visit | Attending: Family Medicine | Admitting: Family Medicine

## 2013-11-13 DIAGNOSIS — O480 Post-term pregnancy: Secondary | ICD-10-CM | POA: Diagnosis not present

## 2013-11-13 DIAGNOSIS — Z3A40 40 weeks gestation of pregnancy: Secondary | ICD-10-CM | POA: Insufficient documentation

## 2013-11-13 NOTE — Progress Notes (Signed)
Notified of pt arrival in MAU, cervical exam, FHR tracing and uterine activity. Received orders to discharge home

## 2013-11-13 NOTE — Discharge Instructions (Signed)
Third Trimester of Pregnancy The third trimester is from week 29 through week 42, months 7 through 9. The third trimester is a time when the fetus is growing rapidly. At the end of the ninth month, the fetus is about 20 inches in length and weighs 6-10 pounds.  BODY CHANGES Your body goes through many changes during pregnancy. The changes vary from woman to woman.   Your weight will continue to increase. You can expect to gain 25-35 pounds (11-16 kg) by the end of the pregnancy.  You may begin to get stretch marks on your hips, abdomen, and breasts.  You may urinate more often because the fetus is moving lower into your pelvis and pressing on your bladder.  You may develop or continue to have heartburn as a result of your pregnancy.  You may develop constipation because certain hormones are causing the muscles that push waste through your intestines to slow down.  You may develop hemorrhoids or swollen, bulging veins (varicose veins).  You may have pelvic pain because of the weight gain and pregnancy hormones relaxing your joints between the bones in your pelvis. Backaches may result from overexertion of the muscles supporting your posture.  You may have changes in your hair. These can include thickening of your hair, rapid growth, and changes in texture. Some women also have hair loss during or after pregnancy, or hair that feels dry or thin. Your hair will most likely return to normal after your baby is born.  Your breasts will continue to grow and be tender. A yellow discharge may leak from your breasts called colostrum.  Your belly button may stick out.  You may feel short of breath because of your expanding uterus.  You may notice the fetus "dropping," or moving lower in your abdomen.  You may have a bloody mucus discharge. This usually occurs a few days to a week before labor begins.  Your cervix becomes thin and soft (effaced) near your due date. WHAT TO EXPECT AT YOUR PRENATAL  EXAMS  You will have prenatal exams every 2 weeks until week 36. Then, you will have weekly prenatal exams. During a routine prenatal visit:  You will be weighed to make sure you and the fetus are growing normally.  Your blood pressure is taken.  Your abdomen will be measured to track your baby's growth.  The fetal heartbeat will be listened to.  Any test results from the previous visit will be discussed.  You may have a cervical check near your due date to see if you have effaced. At around 36 weeks, your caregiver will check your cervix. At the same time, your caregiver will also perform a test on the secretions of the vaginal tissue. This test is to determine if a type of bacteria, Group B streptococcus, is present. Your caregiver will explain this further. Your caregiver may ask you:  What your birth plan is.  How you are feeling.  If you are feeling the baby move.  If you have had any abnormal symptoms, such as leaking fluid, bleeding, severe headaches, or abdominal cramping.  If you have any questions. Other tests or screenings that may be performed during your third trimester include:  Blood tests that check for low iron levels (anemia).  Fetal testing to check the health, activity level, and growth of the fetus. Testing is done if you have certain medical conditions or if there are problems during the pregnancy. FALSE LABOR You may feel small, irregular contractions that  eventually go away. These are called Braxton Hicks contractions, or false labor. Contractions may last for hours, days, or even weeks before true labor sets in. If contractions come at regular intervals, intensify, or become painful, it is best to be seen by your caregiver.  SIGNS OF LABOR   Menstrual-like cramps.  Contractions that are 5 minutes apart or less.  Contractions that start on the top of the uterus and spread down to the lower abdomen and back.  A sense of increased pelvic pressure or back  pain.  A watery or bloody mucus discharge that comes from the vagina. If you have any of these signs before the 37th week of pregnancy, call your caregiver right away. You need to go to the hospital to get checked immediately. HOME CARE INSTRUCTIONS   Avoid all smoking, herbs, alcohol, and unprescribed drugs. These chemicals affect the formation and growth of the baby.  Follow your caregiver's instructions regarding medicine use. There are medicines that are either safe or unsafe to take during pregnancy.  Exercise only as directed by your caregiver. Experiencing uterine cramps is a good sign to stop exercising.  Continue to eat regular, healthy meals.  Wear a good support bra for breast tenderness.  Do not use hot tubs, steam rooms, or saunas.  Wear your seat belt at all times when driving.  Avoid raw meat, uncooked cheese, cat litter boxes, and soil used by cats. These carry germs that can cause birth defects in the baby.  Take your prenatal vitamins.  Try taking a stool softener (if your caregiver approves) if you develop constipation. Eat more high-fiber foods, such as fresh vegetables or fruit and whole grains. Drink plenty of fluids to keep your urine clear or pale yellow.  Take warm sitz baths to soothe any pain or discomfort caused by hemorrhoids. Use hemorrhoid cream if your caregiver approves.  If you develop varicose veins, wear support hose. Elevate your feet for 15 minutes, 3-4 times a day. Limit salt in your diet.  Avoid heavy lifting, wear low heal shoes, and practice good posture.  Rest a lot with your legs elevated if you have leg cramps or low back pain.  Visit your dentist if you have not gone during your pregnancy. Use a soft toothbrush to brush your teeth and be gentle when you floss.  A sexual relationship may be continued unless your caregiver directs you otherwise.  Do not travel far distances unless it is absolutely necessary and only with the approval  of your caregiver.  Take prenatal classes to understand, practice, and ask questions about the labor and delivery.  Make a trial run to the hospital.  Pack your hospital bag.  Prepare the baby's nursery.  Continue to go to all your prenatal visits as directed by your caregiver. SEEK MEDICAL CARE IF:  You are unsure if you are in labor or if your water has broken.  You have dizziness.  You have mild pelvic cramps, pelvic pressure, or nagging pain in your abdominal area.  You have persistent nausea, vomiting, or diarrhea.  You have a bad smelling vaginal discharge.  You have pain with urination. SEEK IMMEDIATE MEDICAL CARE IF:   You have a fever.  You are leaking fluid from your vagina.  You have spotting or bleeding from your vagina.  You have severe abdominal cramping or pain.  You have rapid weight loss or gain.  You have shortness of breath with chest pain.  You notice sudden or extreme swelling  of your face, hands, ankles, feet, or legs. °· You have not felt your baby move in over an hour. °· You have severe headaches that do not go away with medicine. °· You have vision changes. °Document Released: 12/17/2000 Document Revised: 12/28/2012 Document Reviewed: 02/24/2012 °[employer]® Patient Information ©2015 [employer]. This information is not intended to replace advice given to you by your health care provider. Make sure you discuss any questions you have with your health care provider. °Fetal Movement Counts °Patient Name: __________________________________________________ Patient Due Date: ____________________ °Performing a fetal movement count is highly recommended in high-risk pregnancies, but it is good for every pregnant woman to do. Your health care provider may ask you to start counting fetal movements at 28 weeks of the pregnancy. Fetal movements often increase: °· After eating a full meal. °· After physical activity. °· After eating or drinking something sweet or  cold. °· At rest. °Pay attention to when you feel the baby is most active. This will help you notice a pattern of your baby's sleep and wake cycles and what factors contribute to an increase in fetal movement. It is important to perform a fetal movement count at the same time each day when your baby is normally most active.  °HOW TO COUNT FETAL MOVEMENTS °· Find a quiet and comfortable area to sit or lie down on your left side. Lying on your left side provides the best blood and oxygen circulation to your baby. °· Write down the day and time on a sheet of paper or in a journal. °· Start counting kicks, flutters, swishes, rolls, or jabs in a 2-hour period. You should feel at least 10 movements within 2 hours. °· If you do not feel 10 movements in 2 hours, wait 2-3 hours and count again. Look for a change in the pattern or not enough counts in 2 hours. °SEEK MEDICAL CARE IF: °· You feel less than 10 counts in 2 hours, tried twice. °· There is no movement in over an hour. °· The pattern is changing or taking longer each day to reach 10 counts in 2 hours. °· You feel the baby is not moving as he or she usually does. °Date: ____________ Movements: ____________ Start time: ____________ Finish time: ____________  °Date: ____________ Movements: ____________ Start time: ____________ Finish time: ____________ °Date: ____________ Movements: ____________ Start time: ____________ Finish time: ____________ °Date: ____________ Movements: ____________ Start time: ____________ Finish time: ____________ °Date: ____________ Movements: ____________ Start time: ____________ Finish time: ____________ °Date: ____________ Movements: ____________ Start time: ____________ Finish time: ____________ °Date: ____________ Movements: ____________ Start time: ____________ Finish time: ____________ °Date: ____________ Movements: ____________ Start time: ____________ Finish time: ____________  °Date: ____________ Movements: ____________ Start time:  ____________ Finish time: ____________ °Date: ____________ Movements: ____________ Start time: ____________ Finish time: ____________ °Date: ____________ Movements: ____________ Start time: ____________ Finish time: ____________ °Date: ____________ Movements: ____________ Start time: ____________ Finish time: ____________ °Date: ____________ Movements: ____________ Start time: ____________ Finish time: ____________ °Date: ____________ Movements: ____________ Start time: ____________ Finish time: ____________ °Date: ____________ Movements: ____________ Start time: ____________ Finish time: ____________  °Date: ____________ Movements: ____________ Start time: ____________ Finish time: ____________ °Date: ____________ Movements: ____________ Start time: ____________ Finish time: ____________ °Date: ____________ Movements: ____________ Start time: ____________ Finish time: ____________ °Date: ____________ Movements: ____________ Start time: ____________ Finish time: ____________ °Date: ____________ Movements: ____________ Start time: ____________ Finish time: ____________ °Date: ____________ Movements: ____________ Start time: ____________ Finish time: ____________ °Date: ____________ Movements: ____________ Start time: ____________ Finish time:   ____________  °Date: ____________ Movements: ____________ Start time: ____________ Finish time: ____________ °Date: ____________ Movements: ____________ Start time: ____________ Finish time: ____________ °Date: ____________ Movements: ____________ Start time: ____________ Finish time: ____________ °Date: ____________ Movements: ____________ Start time: ____________ Finish time: ____________ °Date: ____________ Movements: ____________ Start time: ____________ Finish time: ____________ °Date: ____________ Movements: ____________ Start time: ____________ Finish time: ____________ °Date: ____________ Movements: ____________ Start time: ____________ Finish time: ____________  °Date:  ____________ Movements: ____________ Start time: ____________ Finish time: ____________ °Date: ____________ Movements: ____________ Start time: ____________ Finish time: ____________ °Date: ____________ Movements: ____________ Start time: ____________ Finish time: ____________ °Date: ____________ Movements: ____________ Start time: ____________ Finish time: ____________ °Date: ____________ Movements: ____________ Start time: ____________ Finish time: ____________ °Date: ____________ Movements: ____________ Start time: ____________ Finish time: ____________ °Date: ____________ Movements: ____________ Start time: ____________ Finish time: ____________  °Date: ____________ Movements: ____________ Start time: ____________ Finish time: ____________ °Date: ____________ Movements: ____________ Start time: ____________ Finish time: ____________ °Date: ____________ Movements: ____________ Start time: ____________ Finish time: ____________ °Date: ____________ Movements: ____________ Start time: ____________ Finish time: ____________ °Date: ____________ Movements: ____________ Start time: ____________ Finish time: ____________ °Date: ____________ Movements: ____________ Start time: ____________ Finish time: ____________ °Date: ____________ Movements: ____________ Start time: ____________ Finish time: ____________  °Date: ____________ Movements: ____________ Start time: ____________ Finish time: ____________ °Date: ____________ Movements: ____________ Start time: ____________ Finish time: ____________ °Date: ____________ Movements: ____________ Start time: ____________ Finish time: ____________ °Date: ____________ Movements: ____________ Start time: ____________ Finish time: ____________ °Date: ____________ Movements: ____________ Start time: ____________ Finish time: ____________ °Date: ____________ Movements: ____________ Start time: ____________ Finish time: ____________ °Date: ____________ Movements: ____________ Start  time: ____________ Finish time: ____________  °Date: ____________ Movements: ____________ Start time: ____________ Finish time: ____________ °Date: ____________ Movements: ____________ Start time: ____________ Finish time: ____________ °Date: ____________ Movements: ____________ Start time: ____________ Finish time: ____________ °Date: ____________ Movements: ____________ Start time: ____________ Finish time: ____________ °Date: ____________ Movements: ____________ Start time: ____________ Finish time: ____________ °Date: ____________ Movements: ____________ Start time: ____________ Finish time: ____________ °Document Released: 01/22/2006 Document Revised: 05/09/2013 Document Reviewed: 10/20/2011 °[employer]® Patient Information ©2015 [employer]. This information is not intended to replace advice given to you by your health care provider. Make sure you discuss any questions you have with your health care provider. °Braxton Hicks Contractions °Contractions of the uterus can occur throughout pregnancy. Contractions are not always a sign that you are in labor.  °WHAT ARE BRAXTON HICKS CONTRACTIONS?  °Contractions that occur before labor are called Braxton Hicks contractions, or false labor. Toward the end of pregnancy (32-34 weeks), these contractions can develop more often and may become more forceful. This is not true labor because these contractions do not result in opening (dilatation) and thinning of the cervix. They are sometimes difficult to tell apart from true labor because these contractions can be forceful and people have different pain tolerances. You should not feel embarrassed if you go to the hospital with false labor. Sometimes, the only way to tell if you are in true labor is for your health care provider to look for changes in the cervix. °If there are no prenatal problems or other health problems associated with the pregnancy, it is completely safe to be sent home with false labor and await the  onset of true labor. °HOW CAN YOU TELL THE DIFFERENCE BETWEEN TRUE AND FALSE LABOR? °False Labor °· The contractions of false labor are usually shorter and not as hard as those of true labor.   °· The contractions   are usually irregular.   °· The contractions are often felt in the front of the lower abdomen and in the groin.   °· The contractions may go away when you walk around or change positions while lying down.   °· The contractions get weaker and are shorter lasting as time goes on.   °· The contractions do not usually become progressively stronger, regular, and closer together as with true labor.   °True Labor °· Contractions in true labor last 30-70 seconds, become very regular, usually become more intense, and increase in frequency.   °· The contractions do not go away with walking.   °· The discomfort is usually felt in the top of the uterus and spreads to the lower abdomen and low back.   °· True labor can be determined by your health care provider with an exam. This will show that the cervix is dilating and getting thinner.   °WHAT TO REMEMBER °· Keep up with your usual exercises and follow other instructions given by your health care provider.   °· Take medicines as directed by your health care provider.   °· Keep your regular prenatal appointments.   °· Eat and drink lightly if you think you are going into labor.   °· If Braxton Hicks contractions are making you uncomfortable:   °· Change your position from lying down or resting to walking, or from walking to resting.   °· Sit and rest in a tub of warm water.   °· Drink 2-3 glasses of water. Dehydration may cause these contractions.   °· Do slow and deep breathing several times an hour.   °WHEN SHOULD I SEEK IMMEDIATE MEDICAL CARE? °Seek immediate medical care if: °· Your contractions become stronger, more regular, and closer together.   °· You have fluid leaking or gushing from your vagina.   °· You have a fever.   °· You pass blood-tinged mucus.    °· You have vaginal bleeding.   °· You have continuous abdominal pain.   °· You have low back pain that you never had before.   °· You feel your baby's head pushing down and causing pelvic pressure.   °· Your baby is not moving as much as it used to.   °Document Released: 12/23/2004 Document Revised: 12/28/2012 Document Reviewed: 10/04/2012 °[employer]® Patient Information ©2015 [employer]. This information is not intended to replace advice given to you by your health care provider. Make sure you discuss any questions you have with your health care provider. ° °

## 2013-11-13 NOTE — [facility] Note (Signed)
Pt presents to [facility] with complaints of contractions that started 3 weeks ago and have gotten worse over the last couple of days.

## 2013-11-15 ENCOUNTER — Ambulatory Visit (HOSPITAL_COMMUNITY)
Admission: RE | Admit: 2013-11-15 | Discharge: 2013-11-15 | Disposition: A | Discharge status: Home / Self Care | Payer: Medicaid Other | Source: Ambulatory Visit | Attending: Nurse Practitioner | Admitting: Nurse Practitioner

## 2013-11-15 ENCOUNTER — Other Ambulatory Visit (HOSPITAL_COMMUNITY): Payer: Self-pay | Admitting: Nurse Practitioner

## 2013-11-15 DIAGNOSIS — O480 Post-term pregnancy: Secondary | ICD-10-CM

## 2013-11-15 DIAGNOSIS — Z3A40 40 weeks gestation of pregnancy: Secondary | ICD-10-CM | POA: Diagnosis not present

## 2013-11-22 ENCOUNTER — Ambulatory Visit (HOSPITAL_COMMUNITY): Admission: RE | Admit: 2013-11-22 | Payer: Medicaid Other | Source: Ambulatory Visit

## 2013-11-22 ENCOUNTER — Encounter (HOSPITAL_COMMUNITY): Payer: Self-pay | Admitting: *Deleted

## 2013-11-22 ENCOUNTER — Inpatient Hospital Stay (HOSPITAL_COMMUNITY): Payer: Medicaid Other | Admitting: Anesthesiology

## 2013-11-22 ENCOUNTER — Inpatient Hospital Stay (HOSPITAL_COMMUNITY)
Admission: AD | Admit: 2013-11-22 | Discharge: 2013-11-26 | DRG: 765 | Disposition: A | Discharge status: Home / Self Care | Payer: Medicaid Other | Source: Ambulatory Visit | Attending: Obstetrics & Gynecology | Admitting: Obstetrics & Gynecology

## 2013-11-22 DIAGNOSIS — Z3A41 41 weeks gestation of pregnancy: Secondary | ICD-10-CM | POA: Diagnosis present

## 2013-11-22 DIAGNOSIS — O480 Post-term pregnancy: Secondary | ICD-10-CM | POA: Diagnosis present

## 2013-11-22 DIAGNOSIS — O99824 Streptococcus B carrier state complicating childbirth: Secondary | ICD-10-CM | POA: Diagnosis present

## 2013-11-22 DIAGNOSIS — Z051 Observation and evaluation of newborn for suspected infectious condition ruled out: Secondary | ICD-10-CM

## 2013-11-22 DIAGNOSIS — Z87891 Personal history of nicotine dependence: Secondary | ICD-10-CM

## 2013-11-22 DIAGNOSIS — J45909 Unspecified asthma, uncomplicated: Secondary | ICD-10-CM | POA: Diagnosis present

## 2013-11-22 DIAGNOSIS — O9952 Diseases of the respiratory system complicating childbirth: Secondary | ICD-10-CM | POA: Diagnosis present

## 2013-11-22 DIAGNOSIS — O411230 Chorioamnionitis, third trimester, not applicable or unspecified: Secondary | ICD-10-CM | POA: Diagnosis present

## 2013-11-22 DIAGNOSIS — Z0389 Encounter for observation for other suspected diseases and conditions ruled out: Secondary | ICD-10-CM | POA: Insufficient documentation

## 2013-11-22 DIAGNOSIS — O471 False labor at or after 37 completed weeks of gestation: Secondary | ICD-10-CM | POA: Diagnosis present

## 2013-11-22 HISTORY — DX: Unspecified asthma, uncomplicated: J45.909

## 2013-11-22 HISTORY — DX: Unspecified abnormal cytological findings in specimens from vagina: R87.629

## 2013-11-22 HISTORY — DX: Trichomoniasis, unspecified: A59.9

## 2013-11-22 HISTORY — DX: Depression, unspecified: F32.A

## 2013-11-22 HISTORY — DX: Benign neoplasm of connective and other soft tissue, unspecified: D21.9

## 2013-11-22 HISTORY — DX: Major depressive disorder, single episode, unspecified: F32.9

## 2013-11-22 LAB — CBC
HCT: 37.5 % (ref 36.0–46.0)
HEMOGLOBIN: 12.7 g/dL (ref 12.0–15.0)
MCH: 28.3 pg (ref 26.0–34.0)
MCHC: 33.9 g/dL (ref 30.0–36.0)
MCV: 83.7 fL (ref 78.0–100.0)
Platelets: 225 10*3/uL (ref 150–400)
RBC: 4.48 MIL/uL (ref 3.87–5.11)
RDW: 14 % (ref 11.5–15.5)
WBC: 13.5 10*3/uL — AB (ref 4.0–10.5)

## 2013-11-22 LAB — RPR

## 2013-11-22 LAB — ABO/RH: ABO/RH(D): O POS

## 2013-11-22 LAB — TYPE AND SCREEN
ABO/RH(D): O POS
Antibody Screen: NEGATIVE

## 2013-11-22 MED ORDER — ACETAMINOPHEN 650 MG RE SUPP
650.0000 mg | RECTAL | Status: DC | PRN
  Administered 2013-11-22: 650 mg via RECTAL
  Filled 2013-11-22: qty 1

## 2013-11-22 MED ORDER — PHENYLEPHRINE 40 MCG/ML (10ML) SYRINGE FOR IV PUSH (FOR BLOOD PRESSURE SUPPORT)
PREFILLED_SYRINGE | INTRAVENOUS | Status: AC
  Filled 2013-11-22: qty 10

## 2013-11-22 MED ORDER — TERBUTALINE SULFATE 1 MG/ML IJ SOLN: 0.2500 mg | Freq: Once | INTRAMUSCULAR | Status: AC | PRN

## 2013-11-22 MED ORDER — PHENYLEPHRINE 40 MCG/ML (10ML) SYRINGE FOR IV PUSH (FOR BLOOD PRESSURE SUPPORT)
80.0000 ug | PREFILLED_SYRINGE | INTRAVENOUS | Status: DC | PRN
  Administered 2013-11-23: 80 ug via INTRAVENOUS
  Filled 2013-11-22: qty 10

## 2013-11-22 MED ORDER — OXYTOCIN 40 UNITS IN LACTATED RINGERS INFUSION - SIMPLE MED: 1.0000 m[IU]/min | INTRAVENOUS | Status: DC

## 2013-11-22 MED ORDER — LACTATED RINGERS IV SOLN
500.0000 mL | Freq: Once | INTRAVENOUS | Status: AC
  Administered 2013-11-22: 500 mL via INTRAVENOUS

## 2013-11-22 MED ORDER — LACTATED RINGERS IV SOLN
INTRAVENOUS | Status: DC
  Administered 2013-11-22: 125 mL/h via INTRAVENOUS
  Administered 2013-11-22 (×3): via INTRAVENOUS

## 2013-11-22 MED ORDER — FENTANYL 2.5 MCG/ML BUPIVACAINE 1/10 % EPIDURAL INFUSION (WH - ANES)
INTRAMUSCULAR | Status: AC
  Filled 2013-11-22: qty 125

## 2013-11-22 MED ORDER — EPHEDRINE 5 MG/ML INJ: 10.0000 mg | INTRAVENOUS | Status: DC | PRN

## 2013-11-22 MED ORDER — LACTATED RINGERS IV SOLN
INTRAVENOUS | Status: DC
  Administered 2013-11-22 (×2): via INTRAUTERINE

## 2013-11-22 MED ORDER — LIDOCAINE HCL (PF) 1 % IJ SOLN
INTRAMUSCULAR | Status: DC | PRN
  Administered 2013-11-22 (×2): 8 mL

## 2013-11-22 MED ORDER — OXYCODONE-ACETAMINOPHEN 5-325 MG PO TABS: 2.0000 | ORAL_TABLET | ORAL | Status: DC | PRN

## 2013-11-22 MED ORDER — LACTATED RINGERS IV SOLN
500.0000 mL | INTRAVENOUS | Status: DC | PRN
  Administered 2013-11-22: 500 mL via INTRAVENOUS

## 2013-11-22 MED ORDER — FENTANYL 2.5 MCG/ML BUPIVACAINE 1/10 % EPIDURAL INFUSION (WH - ANES)
14.0000 mL/h | INTRAMUSCULAR | Status: DC | PRN
  Administered 2013-11-22 (×2): 14 mL/h via EPIDURAL
  Filled 2013-11-22: qty 125

## 2013-11-22 MED ORDER — CEFAZOLIN SODIUM 1-5 GM-% IV SOLN
1.0000 g | Freq: Three times a day (TID) | INTRAVENOUS | Status: DC
  Administered 2013-11-22: 1 g via INTRAVENOUS
  Filled 2013-11-22 (×3): qty 50

## 2013-11-22 MED ORDER — CEFAZOLIN SODIUM-DEXTROSE 2-3 GM-% IV SOLR
2.0000 g | Freq: Once | INTRAVENOUS | Status: AC
  Administered 2013-11-22: 2 g via INTRAVENOUS
  Filled 2013-11-22: qty 50

## 2013-11-22 MED ORDER — FLEET ENEMA 7-19 GM/118ML RE ENEM: 1.0000 | ENEMA | RECTAL | Status: DC | PRN

## 2013-11-22 MED ORDER — OXYTOCIN BOLUS FROM INFUSION: 500.0000 mL | INTRAVENOUS | Status: DC

## 2013-11-22 MED ORDER — FENTANYL 2.5 MCG/ML BUPIVACAINE 1/10 % EPIDURAL INFUSION (WH - ANES)
INTRAMUSCULAR | Status: DC | PRN
  Administered 2013-11-22: 14 mL/h via EPIDURAL

## 2013-11-22 MED ORDER — LIDOCAINE HCL (PF) 1 % IJ SOLN: 30.0000 mL | INTRAMUSCULAR | Status: DC | PRN

## 2013-11-22 MED ORDER — DIPHENHYDRAMINE HCL 50 MG/ML IJ SOLN: 12.5000 mg | INTRAMUSCULAR | Status: DC | PRN

## 2013-11-22 MED ORDER — CITRIC ACID-SODIUM CITRATE 334-500 MG/5ML PO SOLN
30.0000 mL | ORAL | Status: DC | PRN
  Administered 2013-11-23: 30 mL via ORAL
  Filled 2013-11-22: qty 15

## 2013-11-22 MED ORDER — ACETAMINOPHEN 500 MG PO TABS: 1000.0000 mg | ORAL_TABLET | Freq: Once | ORAL | Status: DC

## 2013-11-22 MED ORDER — ONDANSETRON HCL 4 MG/2ML IJ SOLN: 4.0000 mg | Freq: Four times a day (QID) | INTRAMUSCULAR | Status: DC | PRN

## 2013-11-22 MED ORDER — ACETAMINOPHEN 325 MG PO TABS: 650.0000 mg | ORAL_TABLET | ORAL | Status: DC | PRN

## 2013-11-22 MED ORDER — GENTAMICIN SULFATE 40 MG/ML IJ SOLN
170.0000 mg | Freq: Three times a day (TID) | INTRAVENOUS | Status: DC
  Administered 2013-11-22 – 2013-11-23 (×2): 170 mg via INTRAVENOUS
  Filled 2013-11-22 (×3): qty 4.25

## 2013-11-22 MED ORDER — OXYTOCIN 40 UNITS IN LACTATED RINGERS INFUSION - SIMPLE MED
1.0000 m[IU]/min | INTRAVENOUS | Status: DC
  Administered 2013-11-22: 2 m[IU]/min via INTRAVENOUS
  Filled 2013-11-22: qty 1000

## 2013-11-22 MED ORDER — PHENYLEPHRINE 40 MCG/ML (10ML) SYRINGE FOR IV PUSH (FOR BLOOD PRESSURE SUPPORT): 80.0000 ug | PREFILLED_SYRINGE | INTRAVENOUS | Status: DC | PRN

## 2013-11-22 MED ORDER — OXYTOCIN 40 UNITS IN LACTATED RINGERS INFUSION - SIMPLE MED: 62.5000 mL/h | INTRAVENOUS | Status: DC

## 2013-11-22 MED ORDER — OXYCODONE-ACETAMINOPHEN 5-325 MG PO TABS: 1.0000 | ORAL_TABLET | ORAL | Status: DC | PRN

## 2013-11-22 NOTE — Progress Notes (Signed)
Dr. Deniece Ree told to phone Dr. Roselie Awkward to do next cervical exam after an hour since last exam.

## 2013-11-22 NOTE — H&P (Signed)
Kathy Flores is a 23 y.o. female presenting for SROM.  Patient reports gush of fluid leakage around 0630 this AM.  She continues to have continued leaking of fluid.  Denies VB, +FM. Irregular contractions. No complications during pregnancy or previous pregnancies.  Girl, bottle, Nexplanon.    History OB History    Gravida Para Term Preterm AB TAB SAB Ectopic Multiple Living   3 2 2       2      Past Medical History  Diagnosis Date  . Asthma     inhaler used 3x month   History reviewed. No pertinent past surgical history. Family History: family history is not on file. Social History:  reports that she has quit smoking. She does not have any smokeless tobacco history on file. She reports that she does not drink alcohol or use illicit drugs.   Prenatal Transfer Tool  Maternal Diabetes: No 1h gtt 132 Genetic Screening: Normal Maternal Ultrasounds/Referrals: Normal Fetal Ultrasounds or other Referrals:  None Maternal Substance Abuse:  No Significant Maternal Medications:  None Significant Maternal Lab Results:  Lab values include: Group B Strep positive Other Comments:  None  Review of Systems  Constitutional: Negative for fever and chills.  Gastrointestinal: Negative for vomiting.      There were no vitals taken for this visit. Maternal Exam:  Uterine Assessment: Contraction strength is mild.  Contraction frequency is irregular.   Introitus: Amniotic fluid character: not assessed.  Pelvis: adequate for delivery.   Cervix: Cervix evaluated by digital exam.    Cervix: 3/50/-1   Physical Exam  Constitutional: She is oriented to person, place, and time. She appears well-developed and well-nourished. No distress.  HENT:  Head: Normocephalic and atraumatic.  Cardiovascular: Normal rate.   Respiratory: Effort normal. No respiratory distress.  Musculoskeletal: She exhibits no edema or tenderness.  Neurological: She is alert and oriented to person, place, and time.     Prenatal labs: ABO, Rh:  O positive Antibody:    negative Rubella:   immune RPR:   nonreactive HBsAg:    negative HIV:   nonreactive GBS: Positive (10/15 0000)   Assessment/Plan: - Admit for labor with pitocin for augmentation - Cefazolin for GBS ppx    Lavon Paganini 11/22/2013, 10:11 AM  I examined pt and agree with documentation above and resident plan of care. Venia Carbon Michiel Cowboy, CNM

## 2013-11-22 NOTE — Anesthesia Preprocedure Evaluation (Signed)
Anesthesia Evaluation  Patient identified by MRN, date of birth, ID band Patient awake    Reviewed: Allergy & Precautions, H&P , NPO status , Patient's Chart, lab work & pertinent test results  Airway Mallampati: I  TM Distance: >3 FB Neck ROM: full    Dental no notable dental hx.    Pulmonary former smoker,    Pulmonary exam normal       Cardiovascular negative cardio ROS      Neuro/Psych negative neurological ROS     GI/Hepatic negative GI ROS, Neg liver ROS,   Endo/Other  negative endocrine ROS  Renal/GU negative Renal ROS     Musculoskeletal   Abdominal Normal abdominal exam  (+)   Peds  Hematology negative hematology ROS (+)   Anesthesia Other Findings   Reproductive/Obstetrics (+) Pregnancy                             Anesthesia Physical Anesthesia Plan  ASA: II  Anesthesia Plan: Epidural   Post-op Pain Management:    Induction:   Airway Management Planned:   Additional Equipment:   Intra-op Plan:   Post-operative Plan:   Informed Consent: I have reviewed the patients History and Physical, chart, labs and discussed the procedure including the risks, benefits and alternatives for the proposed anesthesia with the patient or authorized representative who has indicated his/her understanding and acceptance.     Plan Discussed with:   Anesthesia Plan Comments:         Anesthesia Quick Evaluation

## 2013-11-22 NOTE — Progress Notes (Signed)
   Subjective: Pt reports increased pain with contractions.  Desires epidural.    Objective: BP 106/53 mmHg  Pulse 83  Temp(Src) 97.6 F (36.4 C) (Oral)  Resp 18  Ht 5\' 3"  (1.6 m)  Wt 95.255 kg (210 lb)  BMI 37.21 kg/m2      FHT:  FHR: 120's bpm, variability: moderate,  accelerations:  Present,  decelerations:  Absent UC:   irregular, every 2-6 minutes SVE:   Dilation: 3 Effacement (%): 50 Station: -1 Exam by:: Reina Fuse, CNM  Labs: Lab Results  Component Value Date   WBC 13.5* 11/22/2013   HGB 12.7 11/22/2013   HCT 37.5 11/22/2013   MCV 83.7 11/22/2013   PLT 225 11/22/2013    Assessment / Plan: 23 yo G3P2002 at [redacted]w[redacted]d wks IUP SROM  Labor: Progressing normally Preeclampsia:  n/a Fetal Wellbeing:  Category I Pain Control:  Desires epidural I/D:  GBS pos Anticipated MOD:  NSVD  Kathy Flores N 11/22/2013, 1:59 PM

## 2013-11-22 NOTE — Progress Notes (Signed)
ANTIBIOTIC CONSULT NOTE - INITIAL  Pharmacy Consult for Gentamicin Indication: Chorioamnionitis/ Maternal temp  Allergies  Allergen Reactions  . Penicillins Hives and Rash    Patient Measurements: Height: 5\' 3"  (160 cm) Weight: 210 lb (95.255 kg) IBW/kg (Calculated) : 52.4 Adjusted Body Weight: 65.3kg  Vital Signs: Temp: 102.5 F (39.2 C) (11/17 2156) Temp Source: Oral (11/17 2156) BP: 114/68 mmHg (11/17 2131) Pulse Rate: 94 (11/17 2131) Intake/Output from previous day:   Intake/Output from this shift:    Labs:  Recent Labs  11/22/13 1050  WBC 13.5*  HGB 12.7  PLT 225   Estimated Creatinine Clearance: 119.1 mL/min (by C-G formula based on Cr of 0.8). No results for input(s): VANCOTROUGH, VANCOPEAK, VANCORANDOM, GENTTROUGH, GENTPEAK, GENTRANDOM, TOBRATROUGH, TOBRAPEAK, TOBRARND, AMIKACINPEAK, AMIKACINTROU, AMIKACIN in the last 72 hours.   Microbiology: No results found for this or any previous visit (from the past 720 hour(s)).  Medical History: Past Medical History  Diagnosis Date  . Asthma     inhaler used 3x month  . Trichomonas infection   . Vaginal Pap smear, abnormal   . Depression   . Fibroid     Medications:  Cefazolin 2 gram then 1 gram IV q8h for GBS + with Penicillin allergy Assessment: 23yo F admitted at 41+ weeks with SROM. Pt has now developed maternal temp during labor. Gentamicin added to Cefazolin for r/o chorioamnionitis in patient with Penicillin allergy.  Goal of Therapy:  Gentamicin peaks 6-29mcg/ml and trough < 28mcg/ml  Plan:  1. Gentamicin 170mg  IV q8h. 2. Will continue to follow and assess need for further kinetic workup if continued postpartum. Thanks!  Vernie Ammons 11/22/2013,10:00 PM

## 2013-11-22 NOTE — Anesthesia Procedure Notes (Signed)
Epidural Patient location during procedure: OB Start time: 11/22/2013 2:17 PM End time: 11/22/2013 2:21 PM  Staffing Anesthesiologist: Lyn Hollingshead  Preanesthetic Checklist Completed: patient identified, surgical consent, pre-op evaluation, timeout performed, IV checked, risks and benefits discussed and monitors and equipment checked  Epidural Patient position: sitting Prep: site prepped and draped and DuraPrep Patient monitoring: continuous pulse ox and blood pressure Approach: midline Location: L3-L4 Injection technique: LOR air  Needle:  Needle type: Tuohy  Needle gauge: 17 G Needle length: 9 cm and 9 Needle insertion depth: 6 cm Catheter type: closed end flexible Catheter size: 19 Gauge Catheter at skin depth: 11 cm Test dose: negative and Other  Assessment Sensory level: T9 Events: blood not aspirated, injection not painful, no injection resistance, negative IV test and no paresthesia  Additional Notes Reason for block:procedure for pain

## 2013-11-22 NOTE — MAU Note (Signed)
Water broke at 0600, clear fluid. Some contractions. Denies any problems with pregnancy.

## 2013-11-22 NOTE — Progress Notes (Signed)
   Subjective: Patinet doing well. No complaints  Objective: BP 118/61 mmHg  Pulse 87  Temp(Src) 98.6 F (37 C) (Oral)  Resp 18  Ht 5\' 3"  (1.6 m)  Wt 95.255 kg (210 lb)  BMI 37.21 kg/m2  SpO2 100%      FHT:  FHR: 100-150 bpm, variability: moderate,  accelerations:  Abscent,  decelerations:  Present variables UC:   irregular, every 2-6 minutes SVE:   Dilation: 6 Effacement (%): 80 Station: -1 Exam by:: Kathrine Haddock, CMN  Labs: Lab Results  Component Value Date   WBC 13.5* 11/22/2013   HGB 12.7 11/22/2013   HCT 37.5 11/22/2013   MCV 83.7 11/22/2013   PLT 225 11/22/2013    Assessment / Plan: 23 yo G3P2002 at [redacted]w[redacted]d IUP SROM  Labor: Progressing normally Preeclampsia:  n/a Fetal Wellbeing:  Category II Pain Control:  Epidural I/D:  GBS pos Anticipated MOD:  NSVD  Kathy Flores 11/22/2013, 5:41 PM  IUPC and FSE placed without difficulty.  Plan for amnioinfusion if variable decels continue.  Contacted Dr. Roselie Awkward and reviewed fetal monitoring strip.  Agrees with plan of care. Venia Carbon Michiel Cowboy, CNM

## 2013-11-22 NOTE — Progress Notes (Signed)
PIETRINA JAGODZINSKI is a 23 y.o. G3P2002 at [redacted]w[redacted]d by ultrasound admitted for active labor  Subjective:good pain control   Objective: BP 114/86 mmHg  Pulse 108  Temp(Src) 102.5 F (39.2 C) (Oral)  Resp 18  Ht 5\' 3"  (1.6 m)  Wt 95.255 kg (210 lb)  BMI 37.21 kg/m2  SpO2 100%   Total I/O In: -  Out: 1000 [Urine:1000]  FHT:  FHR: 150 bpm, variability: moderate,  accelerations:  Present,  decelerations:  Present variables UC:   irregular, every 2-4 minutes SVE:   Dilation: 8 Effacement (%): 50 Station: 0 Exam by:: Dr. Roselie Awkward  Labs: Lab Results  Component Value Date   WBC 13.5* 11/22/2013   HGB 12.7 11/22/2013   HCT 37.5 11/22/2013   MCV 83.7 11/22/2013   PLT 225 11/22/2013    Assessment / Plan: Protracted active phase   Preeclampsia:  no signs or symptoms of toxicity Fetal Wellbeing:  Category II Pain Control:  Epidural I/D:  Ancef and gent  Anticipated MOD:  NSVD  Magic Mohler 11/22/2013, 11:09 PM

## 2013-11-22 NOTE — Progress Notes (Signed)
Kathy Flores is a 23 y.o. G3P2002 at [redacted]w[redacted]d by ultrasound admitted for active labor  Subjective:good pain control   Objective: BP 116/66 mmHg  Pulse 85  Temp(Src) 100 F (37.8 C) (Oral)  Resp 18  Ht 5\' 3"  (1.6 m)  Wt 210 lb (95.255 kg)  BMI 37.21 kg/m2  SpO2 100%     Mode  Fetal scalp electrode filed at 11/22/2013 1801     Baseline Rate (A)  130 bpm filed at 11/22/2013 1931    Variability  6-25 BPM filed at 11/22/2013 1931    Accelerations  15 x 15 filed at 11/22/2013 1931    Decelerations  Variable, Prolonged filed at 11/22/2013 1931      UC:   irregular, every 2-5 minutes SVE:   Dilation: 7 Effacement (%): 80 Station: 0 Exam by:: Dr Roselie Awkward  Labs: Lab Results  Component Value Date   WBC 13.5* 11/22/2013   HGB 12.7 11/22/2013   HCT 37.5 11/22/2013   MCV 83.7 11/22/2013   PLT 225 11/22/2013    Assessment / Plan: Protracted active phase  Labor: irregular contractions Preeclampsia:  no signs or symptoms of toxicity Fetal Wellbeing:  Category II Pain Control:  Epidural I/D:  n/a Anticipated MOD:  NSVD Restart pitocin 1 mU Charm Stenner 11/22/2013, 7:58 PM

## 2013-11-22 NOTE — Progress Notes (Signed)
Called for bed in BS.

## 2013-11-23 ENCOUNTER — Encounter (HOSPITAL_COMMUNITY): Payer: Self-pay | Admitting: General Practice

## 2013-11-23 ENCOUNTER — Encounter (HOSPITAL_COMMUNITY)
Admission: AD | Disposition: A | Discharge status: Home / Self Care | Payer: Self-pay | Source: Ambulatory Visit | Attending: Obstetrics & Gynecology

## 2013-11-23 DIAGNOSIS — O480 Post-term pregnancy: Secondary | ICD-10-CM

## 2013-11-23 DIAGNOSIS — O411230 Chorioamnionitis, third trimester, not applicable or unspecified: Secondary | ICD-10-CM

## 2013-11-23 SURGERY — Surgical Case
Anesthesia: Epidural | Site: Abdomen

## 2013-11-23 MED ORDER — LACTATED RINGERS IV SOLN
INTRAVENOUS | Status: DC
  Administered 2013-11-23 (×2): via INTRAVENOUS

## 2013-11-23 MED ORDER — MORPHINE SULFATE 0.5 MG/ML IJ SOLN
INTRAMUSCULAR | Status: AC
  Filled 2013-11-23: qty 10

## 2013-11-23 MED ORDER — SIMETHICONE 80 MG PO CHEW
80.0000 mg | CHEWABLE_TABLET | Freq: Three times a day (TID) | ORAL | Status: DC
  Administered 2013-11-23 – 2013-11-26 (×9): 80 mg via ORAL
  Filled 2013-11-23 (×8): qty 1

## 2013-11-23 MED ORDER — NALOXONE HCL 0.4 MG/ML IJ SOLN: 0.4000 mg | INTRAMUSCULAR | Status: DC | PRN

## 2013-11-23 MED ORDER — OXYTOCIN 10 UNIT/ML IJ SOLN
40.0000 [IU] | INTRAMUSCULAR | Status: DC | PRN
  Administered 2013-11-23: 40 [IU] via INTRAVENOUS

## 2013-11-23 MED ORDER — SENNOSIDES-DOCUSATE SODIUM 8.6-50 MG PO TABS
2.0000 | ORAL_TABLET | ORAL | Status: DC
  Administered 2013-11-23 – 2013-11-25 (×3): 2 via ORAL
  Filled 2013-11-23 (×3): qty 2

## 2013-11-23 MED ORDER — OXYTOCIN 40 UNITS IN LACTATED RINGERS INFUSION - SIMPLE MED: 62.5000 mL/h | INTRAVENOUS | Status: AC

## 2013-11-23 MED ORDER — LACTATED RINGERS IV SOLN
INTRAVENOUS | Status: DC | PRN
  Administered 2013-11-23: 02:00:00 via INTRAVENOUS

## 2013-11-23 MED ORDER — CLINDAMYCIN PHOSPHATE 900 MG/50ML IV SOLN
900.0000 mg | Freq: Once | INTRAVENOUS | Status: DC
  Filled 2013-11-23: qty 50

## 2013-11-23 MED ORDER — TETANUS-DIPHTH-ACELL PERTUSSIS 5-2.5-18.5 LF-MCG/0.5 IM SUSP: 0.5000 mL | Freq: Once | INTRAMUSCULAR | Status: DC

## 2013-11-23 MED ORDER — ONDANSETRON HCL 4 MG PO TABS: 4.0000 mg | ORAL_TABLET | ORAL | Status: DC | PRN

## 2013-11-23 MED ORDER — ZOLPIDEM TARTRATE 5 MG PO TABS: 5.0000 mg | ORAL_TABLET | Freq: Every evening | ORAL | Status: DC | PRN

## 2013-11-23 MED ORDER — SODIUM BICARBONATE 8.4 % IV SOLN
INTRAVENOUS | Status: DC | PRN
  Administered 2013-11-23: 5 mL via EPIDURAL
  Administered 2013-11-23: 10 mL via EPIDURAL
  Administered 2013-11-23: 5 mL via EPIDURAL

## 2013-11-23 MED ORDER — ONDANSETRON HCL 4 MG/2ML IJ SOLN: 4.0000 mg | Freq: Three times a day (TID) | INTRAMUSCULAR | Status: DC | PRN

## 2013-11-23 MED ORDER — OXYTOCIN 10 UNIT/ML IJ SOLN
INTRAMUSCULAR | Status: AC
  Filled 2013-11-23: qty 4

## 2013-11-23 MED ORDER — FENTANYL CITRATE 0.05 MG/ML IJ SOLN
25.0000 ug | INTRAMUSCULAR | Status: DC | PRN
  Administered 2013-11-23 (×2): 50 ug via INTRAVENOUS

## 2013-11-23 MED ORDER — SCOPOLAMINE 1 MG/3DAYS TD PT72
MEDICATED_PATCH | TRANSDERMAL | Status: DC | PRN
  Administered 2013-11-23: 1 via TRANSDERMAL

## 2013-11-23 MED ORDER — PHENYLEPHRINE 40 MCG/ML (10ML) SYRINGE FOR IV PUSH (FOR BLOOD PRESSURE SUPPORT)
PREFILLED_SYRINGE | INTRAVENOUS | Status: AC
  Filled 2013-11-23: qty 5

## 2013-11-23 MED ORDER — ONDANSETRON HCL 4 MG/2ML IJ SOLN
INTRAMUSCULAR | Status: DC | PRN
  Administered 2013-11-23: 4 mg via INTRAVENOUS

## 2013-11-23 MED ORDER — ONDANSETRON HCL 4 MG/2ML IJ SOLN: 4.0000 mg | INTRAMUSCULAR | Status: DC | PRN

## 2013-11-23 MED ORDER — OXYCODONE-ACETAMINOPHEN 5-325 MG PO TABS
2.0000 | ORAL_TABLET | ORAL | Status: DC | PRN
  Administered 2013-11-25 – 2013-11-26 (×6): 2 via ORAL
  Filled 2013-11-23 (×7): qty 2

## 2013-11-23 MED ORDER — NALOXONE HCL 1 MG/ML IJ SOLN
1.0000 ug/kg/h | INTRAMUSCULAR | Status: DC | PRN
  Filled 2013-11-23: qty 2

## 2013-11-23 MED ORDER — SIMETHICONE 80 MG PO CHEW
80.0000 mg | CHEWABLE_TABLET | ORAL | Status: DC
  Administered 2013-11-23 – 2013-11-25 (×3): 80 mg via ORAL
  Filled 2013-11-23 (×3): qty 1

## 2013-11-23 MED ORDER — NALBUPHINE HCL 10 MG/ML IJ SOLN
5.0000 mg | Freq: Once | INTRAMUSCULAR | Status: AC | PRN
Start: 2013-11-23 — End: 2013-11-23

## 2013-11-23 MED ORDER — DIBUCAINE 1 % RE OINT: 1.0000 "application " | TOPICAL_OINTMENT | RECTAL | Status: DC | PRN

## 2013-11-23 MED ORDER — SCOPOLAMINE 1 MG/3DAYS TD PT72
1.0000 | MEDICATED_PATCH | Freq: Once | TRANSDERMAL | Status: DC
  Filled 2013-11-23: qty 1

## 2013-11-23 MED ORDER — WITCH HAZEL-GLYCERIN EX PADS: 1.0000 "application " | MEDICATED_PAD | CUTANEOUS | Status: DC | PRN

## 2013-11-23 MED ORDER — NALBUPHINE HCL 10 MG/ML IJ SOLN: 5.0000 mg | INTRAMUSCULAR | Status: DC | PRN

## 2013-11-23 MED ORDER — NALBUPHINE HCL 10 MG/ML IJ SOLN: 5.0000 mg | Freq: Once | INTRAMUSCULAR | Status: AC | PRN

## 2013-11-23 MED ORDER — OXYCODONE-ACETAMINOPHEN 5-325 MG PO TABS
1.0000 | ORAL_TABLET | ORAL | Status: DC | PRN
  Administered 2013-11-24 (×2): 1 via ORAL
  Filled 2013-11-23 (×2): qty 1

## 2013-11-23 MED ORDER — SODIUM CHLORIDE 0.9 % IJ SOLN: 3.0000 mL | INTRAMUSCULAR | Status: DC | PRN

## 2013-11-23 MED ORDER — SIMETHICONE 80 MG PO CHEW: 80.0000 mg | CHEWABLE_TABLET | ORAL | Status: DC | PRN

## 2013-11-23 MED ORDER — GENTAMICIN SULFATE 40 MG/ML IJ SOLN
Freq: Three times a day (TID) | INTRAMUSCULAR | Status: AC
  Administered 2013-11-23 (×2): via INTRAVENOUS
  Filled 2013-11-23 (×2): qty 4.25

## 2013-11-23 MED ORDER — KETOROLAC TROMETHAMINE 30 MG/ML IJ SOLN
INTRAMUSCULAR | Status: AC
  Filled 2013-11-23: qty 1

## 2013-11-23 MED ORDER — FENTANYL CITRATE 0.05 MG/ML IJ SOLN
INTRAMUSCULAR | Status: AC
  Administered 2013-11-23: 50 ug via INTRAVENOUS
  Filled 2013-11-23: qty 2

## 2013-11-23 MED ORDER — DIPHENHYDRAMINE HCL 50 MG/ML IJ SOLN: 12.5000 mg | INTRAMUSCULAR | Status: DC | PRN

## 2013-11-23 MED ORDER — PHENYLEPHRINE HCL 10 MG/ML IJ SOLN
INTRAMUSCULAR | Status: DC | PRN
  Administered 2013-11-23: 80 ug via INTRAVENOUS
  Administered 2013-11-23: 40 ug via INTRAVENOUS
  Administered 2013-11-23: 80 ug via INTRAVENOUS
  Administered 2013-11-23 (×2): 40 ug via INTRAVENOUS

## 2013-11-23 MED ORDER — CLINDAMYCIN PHOSPHATE 900 MG/50ML IV SOLN
INTRAVENOUS | Status: DC | PRN
  Administered 2013-11-23: 900 mg via INTRAVENOUS

## 2013-11-23 MED ORDER — PRENATAL MULTIVITAMIN CH
1.0000 | ORAL_TABLET | Freq: Every day | ORAL | Status: DC
  Administered 2013-11-25: 1 via ORAL
  Filled 2013-11-23 (×2): qty 1

## 2013-11-23 MED ORDER — KETOROLAC TROMETHAMINE 30 MG/ML IJ SOLN
30.0000 mg | Freq: Once | INTRAMUSCULAR | Status: AC
  Administered 2013-11-23: 30 mg via INTRAVENOUS

## 2013-11-23 MED ORDER — ONDANSETRON HCL 4 MG/2ML IJ SOLN
INTRAMUSCULAR | Status: AC
  Filled 2013-11-23: qty 2

## 2013-11-23 MED ORDER — LANOLIN HYDROUS EX OINT: 1.0000 "application " | TOPICAL_OINTMENT | CUTANEOUS | Status: DC | PRN

## 2013-11-23 MED ORDER — IBUPROFEN 600 MG PO TABS
600.0000 mg | ORAL_TABLET | Freq: Four times a day (QID) | ORAL | Status: DC
  Administered 2013-11-23 – 2013-11-26 (×12): 600 mg via ORAL
  Filled 2013-11-23 (×12): qty 1

## 2013-11-23 MED ORDER — SCOPOLAMINE 1 MG/3DAYS TD PT72
MEDICATED_PATCH | TRANSDERMAL | Status: AC
  Filled 2013-11-23: qty 1

## 2013-11-23 MED ORDER — CLINDAMYCIN PHOSPHATE 900 MG/50ML IV SOLN
900.0000 mg | Freq: Three times a day (TID) | INTRAVENOUS | Status: DC
  Administered 2013-11-23: 900 mg via INTRAVENOUS
  Filled 2013-11-23: qty 50

## 2013-11-23 MED ORDER — MEPERIDINE HCL 25 MG/ML IJ SOLN: 6.2500 mg | INTRAMUSCULAR | Status: DC | PRN

## 2013-11-23 MED ORDER — MENTHOL 3 MG MT LOZG: 1.0000 | LOZENGE | OROMUCOSAL | Status: DC | PRN

## 2013-11-23 MED ORDER — DIPHENHYDRAMINE HCL 25 MG PO CAPS: 25.0000 mg | ORAL_CAPSULE | Freq: Four times a day (QID) | ORAL | Status: DC | PRN

## 2013-11-23 MED ORDER — MORPHINE SULFATE (PF) 0.5 MG/ML IJ SOLN
INTRAMUSCULAR | Status: DC | PRN
  Administered 2013-11-23: 4 mg via EPIDURAL
  Administered 2013-11-23: 1 mg via INTRAVENOUS

## 2013-11-23 MED ORDER — DIPHENHYDRAMINE HCL 25 MG PO CAPS: 25.0000 mg | ORAL_CAPSULE | ORAL | Status: DC | PRN

## 2013-11-23 SURGICAL SUPPLY — 27 items
BARRIER ADHS 3X4 INTERCEED (GAUZE/BANDAGES/DRESSINGS) IMPLANT
BENZOIN TINCTURE PRP APPL 2/3 (GAUZE/BANDAGES/DRESSINGS) ×2 IMPLANT
CLAMP CORD UMBIL (MISCELLANEOUS) IMPLANT
CLOTH BEACON ORANGE TIMEOUT ST (SAFETY) ×2 IMPLANT
DRAPE SHEET LG 3/4 BI-LAMINATE (DRAPES) IMPLANT
DRSG OPSITE POSTOP 4X10 (GAUZE/BANDAGES/DRESSINGS) ×2 IMPLANT
DURAPREP 26ML APPLICATOR (WOUND CARE) ×2 IMPLANT
ELECT REM PT RETURN 9FT ADLT (ELECTROSURGICAL) ×2
ELECTRODE REM PT RTRN 9FT ADLT (ELECTROSURGICAL) ×1 IMPLANT
EXTRACTOR VACUUM KIWI (MISCELLANEOUS) IMPLANT
GLOVE BIO SURGEON STRL SZ 6.5 (GLOVE) ×2 IMPLANT
GLOVE BIOGEL PI IND STRL 7.0 (GLOVE) ×1 IMPLANT
GLOVE BIOGEL PI INDICATOR 7.0 (GLOVE) ×1
GOWN STRL REUS W/TWL LRG LVL3 (GOWN DISPOSABLE) ×4 IMPLANT
KIT ABG SYR 3ML LUER SLIP (SYRINGE) IMPLANT
NEEDLE HYPO 25X5/8 SAFETYGLIDE (NEEDLE) IMPLANT
NS IRRIG 1000ML POUR BTL (IV SOLUTION) ×2 IMPLANT
PACK C SECTION WH (CUSTOM PROCEDURE TRAY) ×2 IMPLANT
PAD OB MATERNITY 4.3X12.25 (PERSONAL CARE ITEMS) ×2 IMPLANT
STRIP CLOSURE SKIN 1/2X4 (GAUZE/BANDAGES/DRESSINGS) ×2 IMPLANT
SUT VIC AB 0 CT1 36 (SUTURE) ×12 IMPLANT
SUT VIC AB 2-0 CT1 27 (SUTURE) ×1
SUT VIC AB 2-0 CT1 TAPERPNT 27 (SUTURE) ×1 IMPLANT
SUT VIC AB 4-0 PS2 27 (SUTURE) ×2 IMPLANT
TOWEL OR 17X24 6PK STRL BLUE (TOWEL DISPOSABLE) ×2 IMPLANT
TRAY FOLEY CATH 14FR (SET/KITS/TRAYS/PACK) IMPLANT
WATER STERILE IRR 1000ML POUR (IV SOLUTION) IMPLANT

## 2013-11-23 NOTE — Progress Notes (Signed)
Tracing not recording in [place] from time period of 0146 to 0155

## 2013-11-23 NOTE — OR Nursing (Signed)
Placenta to [facility] Refr[facility]ator

## 2013-11-23 NOTE — Transfer of Care (Signed)
Immediate Anesthesia Transfer of Care Note  Patient: Kathy Flores  Procedure(s) Performed: Procedure(s): CESAREAN SECTION (N/A)  Patient Location: PACU  Anesthesia Type:Epidural  Level of Consciousness: awake, alert  and oriented  Airway & Oxygen Therapy: Patient Spontanous Breathing  Post-op Assessment: Report given to PACU RN and Post -op Vital signs reviewed and stable  Post vital signs: Reviewed and stable  Complications: No apparent anesthesia complications

## 2013-11-23 NOTE — Progress Notes (Signed)
Subjective: Postpartum Day 0: Cesarean Delivery Patient reports tolerating PO and + flatus.    Objective: Vital signs in last 24 hours: Temp:  [97.6 F (36.4 C)-102.5 F (39.2 C)] 97.9 F (36.6 C) (11/18 0725) Pulse Rate:  [74-109] 74 (11/18 0725) Resp:  [14-23] 14 (11/18 0725) BP: (83-138)/(46-89) 122/77 mmHg (11/18 0725) SpO2:  [95 %-100 %] 97 % (11/18 0725)  Physical Exam:  General: alert, cooperative and no distress Lochia: appropriate Uterine Fundus: firm Incision: healing well, no significant drainage DVT Evaluation: No evidence of DVT seen on physical exam.   Recent Labs  11/22/13 1050  HGB 12.7  HCT 37.5    Assessment/Plan: Status post Cesarean section. Postoperative course complicated by chorio. Continue Gent/clinda x24h  Continue current care.  Kathy Flores 11/23/2013, 9:43 AM   Left-sided incisional pain. Fundus NT.  Dressing C/D/I. Evaluation and management procedures were performed by Resident physician under my supervision/collaboration. Chart reviewed, patient examined by me and I agree with management and plan.

## 2013-11-23 NOTE — Anesthesia Postprocedure Evaluation (Signed)
  Anesthesia Post-op Note  Patient: Kathy Flores  Procedure(s) Performed: Procedure(s): CESAREAN SECTION (N/A)  Patient Location: Mother/Baby  Anesthesia Type:Epidural  Level of Consciousness: awake, alert  and oriented  Airway and Oxygen Therapy: Patient Spontanous Breathing  Post-op Pain: none  Post-op Assessment: Post-op Vital signs reviewed, Patient's Cardiovascular Status Stable, Respiratory Function Stable, Pain level controlled, Pain level not controlled, No headache, No backache, No residual numbness and No residual motor weakness  Post-op Vital Signs: Reviewed and stable  Last Vitals:  Filed Vitals:   11/23/13 0725  BP: 122/77  Pulse: 74  Temp: 36.6 C  Resp: 14    Complications: No apparent anesthesia complications

## 2013-11-23 NOTE — Op Note (Signed)
Kathy Flores PROCEDURE DATE: 11/23/2013  PREOPERATIVE DIAGNOSES: Intrauterine pregnancy at [redacted]w[redacted]d weeks gestation; chorioamnionitis, failure to progress: arrest of dilation and fetal intolerance of labor  POSTOPERATIVE DIAGNOSES: The same, occiput posterior  PROCEDURE: Primary Low Transverse Cesarean Section  SURGEON:  Dr. Emeterio Reeve  ASSISTANT:  Dr. Nila Nephew  ANESTHESIOLOGIST: Dr. Glennon Mac  INDICATIONS: Kathy Flores is a 23 y.o. [phone] at [redacted]w[redacted]d here for cesarean section secondary to the indications listed under preoperative diagnoses; please see preoperative note for further details.  The risks of cesarean section were discussed with the patient including but were not limited to: bleeding which may require transfusion or reoperation; infection which may require antibiotics; injury to bowel, bladder, ureters or other surrounding organs; injury to the fetus; need for additional procedures including hysterectomy in the event of a life-threatening hemorrhage; placental abnormalities wth subsequent pregnancies, incisional problems, thromboembolic phenomenon and other postoperative/anesthesia complications.   The patient concurred with the proposed plan, giving informed written consent for the procedure.    FINDINGS:  Viable female infant in cephalic presentation.  Apgars 8 and 9.  Clear amniotic fluid.  Intact placenta, three vessel cord.  Normal uterus, fallopian tubes and ovaries bilaterally.  ANESTHESIA: Epidural INTRAVENOUS FLUIDS: 135ml ESTIMATED BLOOD LOSS: 600 ml URINE OUTPUT:  200 ml SPECIMENS: Placenta sent to pathology COMPLICATIONS: None immediate  PROCEDURE IN DETAIL:  The patient preoperatively received intravenous antibiotics and had sequential compression devices applied to her lower extremities.  She was then taken to the operating room where the epidural anesthesia was dosed up to surgical level and was found to be adequate. She was then placed in a dorsal  supine position with a leftward tilt, and prepped and draped in a sterile manner.  A foley catheter was placed into her bladder and attached to constant gravity.  After an adequate timeout was performed, a Pfannenstiel skin incision was made with scalpel and carried through to the underlying layer of fascia. The fascia was incised in the midline, and this incision was extended bilaterally using the Mayo scissors.  Kocher clamps were applied to the superior aspect of the fascial incision and the underlying rectus muscles were dissected off bluntly. A similar process was carried out on the inferior aspect of the fascial incision. The rectus muscles were separated in the midline bluntly and the peritoneum was entered bluntly. Bladder blade placed. Attention was turned to the lower uterine segment where a low transverse hysterotomy was made with a scalpel and extended bilaterally bluntly.  Bladder blade removed. The infant was successfully delivered, the cord was clamped and cut and the infant was handed over to awaiting neonatology team. Uterine massage was then administered, and the placenta delivered intact with a three-vessel cord. The uterus was then cleared of clot and debris.  The hysterotomy was closed with 0 Vicryl in a running locked fashion, and an imbricating layer was also placed with 0 Vicryl. The pelvis was cleared of all clot and debris. Hemostasis was confirmed on all surfaces.  The peritoneum were reapproximated using 0 Vicryl interrupted stitches. The fascia was then closed using 0 Vicryl in a running fashion.  The subcutaneous layer was irrigated.  The skin was closed with a 4-0 Vicryl subcuticular stitch. The patient tolerated the procedure well. Sponge, lap, instrument and needle counts were correct x 2.  She was taken to the recovery room in stable condition.   Merla Riches, MD OB Fellow Faculty Practice, [facility]

## 2013-11-23 NOTE — Progress Notes (Signed)
Ur chart review completed.  

## 2013-11-23 NOTE — Plan of Care (Signed)
Problem: Phase I Progression Outcomes Goal: Foley catheter patent Outcome: Completed/Met Date Met:  11/23/13

## 2013-11-23 NOTE — Progress Notes (Signed)
CSW received consult for history of anxiety and depression.  CSW attempted to meet with the MOB; however, MOB had numerous visitors in her room.   CSW will make second attempt on 11/19.

## 2013-11-23 NOTE — Progress Notes (Signed)
Epidural pulled, patient stable with pain at tolerable level.  Called Dr Glennon Mac for sign out.

## 2013-11-23 NOTE — Progress Notes (Signed)
Kathy Flores is a 23 y.o. G3P2002 at [redacted]w[redacted]d by ultrasound admitted for active labor  Subjective:epidural working   Objective: BP 110/49 mmHg  Pulse 94  Temp(Src) 100.1 F (37.8 C) (Oral)  Resp 18  Ht 5\' 3"  (1.6 m)  Wt 95.255 kg (210 lb)  BMI 37.21 kg/m2  SpO2 100%   Total I/O In: -  Out: 1000 [Urine:1000]  FHT:  FHR: 135 bpm, variability: moderate,  accelerations:  Present,  decelerations:  Present variables, some prolonged UC:   irregular, every 2-3 minutes SVE:   Dilation: 8.5 Effacement (%): 80 Station: 0 Exam by:: Dr. Deniece Ree  Labs: Lab Results  Component Value Date   WBC 13.5* 11/22/2013   HGB 12.7 11/22/2013   HCT 37.5 11/22/2013   MCV 83.7 11/22/2013   PLT 225 11/22/2013    Assessment / Plan: Arrest in active phase of labor   Preeclampsia:  no signs or symptoms of toxicity Fetal Wellbeing:  Category II Pain Control:  Epidural I/D:  abx ordered Anticipated MOD:  arrest of active phase with fetal intolerance of labor. Offered cesarean section.  The procedure and the risk of anesthesia, bleeding, transfusion, infection, bowel and bladder injury were discussed and her questions were answered. She agreed and signed consent   Kathy Flores 11/23/2013, 1:41 AM

## 2013-11-23 NOTE — Progress Notes (Signed)
LABOR PROGRESS NOTE  Kathy Flores is a 23 y.o. G3P2002 at [redacted]w[redacted]d  admitted in active labor  Subjective: No complaints  Objective: BP 110/49 mmHg  Pulse 94  Temp(Src) 100.1 F (37.8 C) (Oral)  Resp 18  Ht 5\' 3"  (1.6 m)  Wt 210 lb (95.255 kg)  BMI 37.21 kg/m2  SpO2 100% or  Filed Vitals:   11/22/13 2331 11/22/13 2350 11/23/13 0001 11/23/13 0031  BP: 95/46  101/51 110/49  Pulse: 90  99 94  Temp:  100.1 F (37.8 C)    TempSrc:  Oral    Resp: 18  18 18   Height:      Weight:      SpO2:        Total I/O In: -  Out: 1000 [Urine:1000]  FHT:  FHR: 100-110 bpm, variability: moderate,  accelerations:  Present,  decelerations:  Present variables and lates UC:   regular, every 2 minutes SVE:   Dilation: 8.5 Effacement (%): 80 Station: 0 Exam by:: Dr. Deniece Ree  Dilation: 8.5 Effacement (%): 80 Station: 0 Presentation: Vertex Exam by:: Dr. Deniece Ree  Pitocin @ 4 mu/min  Labs: Lab Results  Component Value Date   WBC 13.5* 11/22/2013   HGB 12.7 11/22/2013   HCT 37.5 11/22/2013   MCV 83.7 11/22/2013   PLT 225 11/22/2013    Assessment / Plan: Arrest in active phase of labor  Labor: not progressing normally, fetal intolerance of labor.  Patient has made minimal change since 1930 and now with recurrent late decelerations and chorio.  Patient may also have component of CPD as this baby is suspected to be much larger than previous 5lb babies. Fetal Wellbeing:  Category II Pain Control:  Epidural Anticipated MOD:  proceed to cesarean section   The risks of cesarean section discussed with the patient included but were not limited to: bleeding which may require transfusion or reoperation; infection which may require antibiotics; injury to bowel, bladder, ureters or other surrounding organs; injury to the fetus; need for additional procedures including hysterectomy in the event of a life-threatening hemorrhage; placental abnormalities wth subsequent pregnancies, incisional  problems, thromboembolic phenomenon and other postoperative/anesthesia complications. The patient concurred with the proposed plan, giving informed written consent for the procedure.   Anesthesia and OR aware. Preoperative prophylactic antibiotics and SCDs ordered on call to the OR.  To OR when ready.     Merla Riches, MD 11/23/2013, 1:38 AM

## 2013-11-23 NOTE — Plan of Care (Signed)
Problem: Phase I Progression Outcomes Goal: Pain controlled with appropriate interventions Outcome: Completed/Met Date Met:  11/23/13 Goal: OOB as tolerated unless otherwise ordered Outcome: Completed/Met Date Met:  11/23/13 Goal: VS, stable, temp < 100.4 degrees F Outcome: Completed/Met Date Met:  11/23/13 Goal: Initial discharge plan identified Outcome: Completed/Met Date Met:  11/23/13

## 2013-11-24 ENCOUNTER — Encounter (HOSPITAL_COMMUNITY): Payer: Self-pay | Admitting: Obstetrics & Gynecology

## 2013-11-24 NOTE — Plan of Care (Signed)
Problem: Discharge Progression Outcomes Goal: Activity appropriate for discharge plan Outcome: Completed/Met Date Met:  11/24/13 Goal: Tolerating diet Outcome: Completed/Met Date Met:  11/24/13

## 2013-11-24 NOTE — Plan of Care (Signed)
Problem: Phase I Progression Outcomes Goal: Voiding adequately Outcome: Completed/Met Date Met:  11/24/13 Goal: IS, TCDB as ordered Outcome: Completed/Met Date Met:  11/24/13 Goal: Other Phase I Outcomes/Goals Outcome: Completed/Met Date Met:  11/24/13  Problem: Phase II Progression Outcomes Goal: Pain controlled on oral analgesia Outcome: Completed/Met Date Met:  11/24/13 Goal: Progress activity as tolerated unless otherwise ordered Outcome: Completed/Met Date Met:  11/24/13 Goal: Afebrile, VS remain stable Outcome: Completed/Met Date Met:  11/24/13 Goal: Incision intact & without signs/symptoms of infection Outcome: Completed/Met Date Met:  11/24/13 Goal: Tolerating diet Outcome: Completed/Met Date Met:  11/24/13 Goal: Other Phase II Outcomes/Goals Outcome: Completed/Met Date Met:  11/24/13

## 2013-11-24 NOTE — Progress Notes (Signed)
Clinical Social Work Department PSYCHOSOCIAL ASSESSMENT - MATERNAL/CHILD 03/09/13  Patient:  [name]  Account Number:  [id number]  Admit Date:  03-18-2013  Ardine Eng Name:   [place] Worker:  Lucita Ferrara, CLINICAL SOCIAL WORKER   Date/Time:  13-Nov-2013 10:00 AM  Date Referred:  16-Oct-2013   Referral source  Central Nursery     Referred reason  Depression/Anxiety   Other referral source:    I:  FAMILY / HOME ENVIRONMENT Child's legal guardian:  PARENT  Guardian - Name Guardian - Age Guardian - Address  Desiree [address], [place] [address]  [place]  same as above   Other household support members/support persons Name Relationship DOB   MOTHER    Other support:   MOB stated that she lives with the FOB and his mother.  She identified them as supportive.  MOB provided consent for them to be present for the assessment.    II  PSYCHOSOCIAL DATA Information Source:  Family Interview  Financial and [employer] Employment:   MOB stated that she is eager to return to work. 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   Financial resources:  Medicaid If Medicaid - County:  [place] / Grade:  N/A Music therapist / Child Services Coordination / Early Interventions:   None reported  Cultural issues impacting care:   None reported    III  STRENGTHS Strengths  Adequate Resources  Home prepared for Child (including basic supplies)  Supportive family/friends   Strength comment:    IV  RISK FACTORS AND CURRENT PROBLEMS Current Problem:  YES   Risk Factor & Current Problem Patient Issue Family Issue Risk Factor / Current Problem Comment  Mental Illness Y N MOB presents with history of depression and anxiety.  MOB acknowledged symptoms during her pregnancy.    V  SOCIAL WORK ASSESSMENT CSW met with the MOB in her room in order to complete the assessment.  Consult was ordered due to MOB presenting with a history of anxiety and depression.  MOB provided consent for the FOB and the PGM to be present for the entire visit.  MOB was observed to be attentive and bonding with the baby.  She presented as guarded as evidenced by limited desire to process her depression and anxiety; however, she was in a pleasant mood and was respectful throughout the visit.  She did not present with any acute mental health symptoms, and presents with awareness of importance of treating symptoms in the postpartum period.  MOB expressed appreciation for the visit, and thanked CSW for referrals.   CSW provided supportive listening and validated the MOB's feelings as she reflected upon feeling overwhelmed due to now having three children.  She struggled to identify daily coping skills that assist her when she feels overwhelmed, but stated that "I just do it, I cannot explain it".  MOB acknowledged that she has feel overwhelmed and stressed during the pregnancy, but she presented with limited self-awareness about what is causing her feeling.  She stated that she does not know what triggers her anxiety, nor does she have any awareness when her anxiety is starting to worsen.  She stated, "I just know when it is bad".  MOB shared that when she is having a "bad day", she isolates,cries, and is irritable.    MOB acknowledged her symptoms of anxiety and depression as a problem since she does  not like how she feels when she is overwhelmed.  The PGM inquired about medications, but MOB stated that she was unsure how she felt about medications.   CSW guided to explore the MOB's feelings secondary to medications.  MOB acknowledged that if she does not attempt to treat her symptoms either with therapy or medications, her symptoms will likely to continue.  She stated that since she does not want her symptoms to continue or worsen, she is receptive to referrals.  MOB declined referral for therapy since "I've  tried that before (age 22), but didn't like it", but was receptive to referral for medication management.  She stated that she was prescribed Vistaril during her pregnancy and that it was effective to address her anxiety.  She shared that she has the ability to refill her Vistaril, but also acknowledged importance of following up with a psychiatrist for ongoing monitoring of symptoms.   CSW provided education on postpartum depression, and discussed increased risk due to her history of depression, anxiety, postpartum depression, and mood symptoms during this pregnancy. MOB acknowledged the information.  No barriers to discharge.    VI SOCIAL WORK PLAN Social Work Secretary/administrator Education  Information/Referral to [employer]  No Further Intervention Required / No Barriers to Discharge   Type of pt/family education:   Postpartum depression   If child protective services report - county:   If child protective services report - date:   Information/referral to community resources comment:   MOB declined referral for therapy, but expressed interest in referral for medication management.  CSW provided list of resources of [place].   Other social work plan:   CSW to follow-up PRN.

## 2013-11-24 NOTE — Progress Notes (Signed)
Subjective: Postpartum Day 1: Cesarean Delivery Patient reports tolerating PO, + flatus and no problems voiding.    Objective: Vital signs in last 24 hours: Temp:  [97.4 F (36.3 C)-98.1 F (36.7 C)] 97.4 F (36.3 C) (11/19 0540) Pulse Rate:  [61-72] 61 (11/19 0540) Resp:  [16-18] 18 (11/19 0540) BP: (90-121)/(49-73) 90/49 mmHg (11/19 0540) SpO2:  [97 %-100 %] 100 % (11/19 0200)  Physical Exam:  General: alert, cooperative and no distress Lochia: appropriate Uterine Fundus: firm Incision: healing well, drainage appreciated over R side of incision DVT Evaluation: No evidence of DVT seen on physical exam.   Recent Labs  11/22/13 1050  HGB 12.7  HCT 37.5    Assessment/Plan: Status post Cesarean section. Postoperative course complicated by chorio. s/p Gent/clinda x24h  Continue current care.  Lavon Paganini 11/24/2013, 7:40 AM

## 2013-11-24 NOTE — Plan of Care (Signed)
Problem: Consults Goal: Postpartum Patient Education (See Patient Education module for education specifics.)  Outcome: Completed/Met Date Met:  11/24/13

## 2013-11-25 ENCOUNTER — Inpatient Hospital Stay (HOSPITAL_COMMUNITY): Admission: RE | Admit: 2013-11-25 | Payer: Medicaid Other | Source: Ambulatory Visit

## 2013-11-25 LAB — CBC WITH DIFFERENTIAL/PLATELET
BASOS ABS: 0 10*3/uL (ref 0.0–0.1)
Basophils Relative: 0 % (ref 0–1)
EOS PCT: 4 % (ref 0–5)
Eosinophils Absolute: 0.3 10*3/uL (ref 0.0–0.7)
HCT: 32.3 % — ABNORMAL LOW (ref 36.0–46.0)
Hemoglobin: 10.7 g/dL — ABNORMAL LOW (ref 12.0–15.0)
LYMPHS ABS: 2.1 10*3/uL (ref 0.7–4.0)
Lymphocytes Relative: 27 % (ref 12–46)
MCH: 27.9 pg (ref 26.0–34.0)
MCHC: 33.1 g/dL (ref 30.0–36.0)
MCV: 84.1 fL (ref 78.0–100.0)
Monocytes Absolute: 0.7 10*3/uL (ref 0.1–1.0)
Monocytes Relative: 9 % (ref 3–12)
Neutro Abs: 4.8 10*3/uL (ref 1.7–7.7)
Neutrophils Relative %: 60 % (ref 43–77)
PLATELETS: 193 10*3/uL (ref 150–400)
RBC: 3.84 MIL/uL — ABNORMAL LOW (ref 3.87–5.11)
RDW: 14.1 % (ref 11.5–15.5)
WBC: 7.8 10*3/uL (ref 4.0–10.5)

## 2013-11-25 NOTE — Progress Notes (Signed)
Subjective: Postpartum Day 2: Cesarean Delivery Patient reports tolerating PO, + flatus and no problems voiding.  Complains of abdominal pain  Objective: Vital signs in last 24 hours: Temp:  [97.3 F (36.3 C)-98.1 F (36.7 C)] 98.1 F (36.7 C) (11/20 0556) Pulse Rate:  [66-120] 66 (11/20 0556) Resp:  [20] 20 (11/19 1708) BP: (96-118)/(54-65) 118/54 mmHg (11/20 0556) SpO2:  [100 %] 100 % (11/20 0556)  Physical Exam:  General: alert, cooperative and no distress Lochia: appropriate Abdomen: distended and TTP in upper quadrants Uterine Fundus: firm Incision: healing well, drainage appreciated over R side of incision DVT Evaluation: No evidence of DVT seen on physical exam.   Recent Labs  11/22/13 1050 11/25/13 0805  HGB 12.7 10.7*  HCT 37.5 32.3*    Assessment/Plan: Status post Cesarean section. Postoperative course complicated by chorio. s/p Gent/clinda x24h  Continue current care.  Lavon Paganini 11/25/2013, 9:15 AM    I have seen and examined this patient and agree the above assessment. CRESENZO-DISHMAN,Jahnaya Branscome 11/29/2013 12:33 PM

## 2013-11-26 DIAGNOSIS — Z0389 Encounter for observation for other suspected diseases and conditions ruled out: Secondary | ICD-10-CM | POA: Insufficient documentation

## 2013-11-26 DIAGNOSIS — Z051 Observation and evaluation of newborn for suspected infectious condition ruled out: Secondary | ICD-10-CM

## 2013-11-26 MED ORDER — OXYCODONE-ACETAMINOPHEN 5-325 MG PO TABS: 1.0000 | ORAL_TABLET | ORAL | Status: DC | PRN

## 2013-11-26 MED ORDER — IBUPROFEN 600 MG PO TABS: 600.0000 mg | ORAL_TABLET | Freq: Four times a day (QID) | ORAL | Status: DC | PRN

## 2013-11-26 NOTE — Discharge Summary (Signed)
Obstetric Discharge Summary Reason for Admission: rupture of membranes Prenatal Procedures: none Intrapartum Procedures: cesarean: low cervical, transverse and GBS prophylaxis Postpartum Procedures: antibiotics- Gent and Clinda x [id number] PP Complications-Operative and Postpartum: none HEMOGLOBIN  Date Value Ref Range Status  11/25/2013 10.7* 12.0 - 15.0 g/dL Final   HCT  Date Value Ref Range Status  11/25/2013 32.3* 36.0 - 46.0 % Final   Kathy Flores is a 23yo [id number] @ 41.0wks admitted on the morning of 11/22/13 with SROM. She was not laboring and so Pitocin was started. During the day as the Pitocin was increased the baby was experiencing decelerations and the pt reached a point of her cx not progressing in labor past 8-9cm. Around that time she developed a fever and was started on additional ABX. Her PP course was uneventful and by POD#3 she was deemed to have received the full benefit of her hosptial stay and was discharged home. She was bottlefeeding and planned Nexplanon for contraception. Her stay included a visit by the SW for hx of anxiety/depression. She is to call the HD for an eval if she experiences s/s of PPD.  Physical Exam:  General: alert, cooperative and no distress  Heart: RRR Lungs: nl effort Lochia: appropriate Uterine Fundus: firm Incision: honeycomb dsg intact; stained and marked on right side DVT Evaluation: No evidence of DVT seen on physical exam.  Discharge Diagnoses: Term Pregnancy-delivered and Chorioamnionitis  Discharge Information: Date: 11/26/2013 Activity: pelvic rest Diet: routine Medications: PNV, Ibuprofen and Percocet Condition: stable Instructions: refer to practice specific booklet Discharge to: home Follow-up Information    Follow up with [facility] [place]. Schedule an appointment as soon as possible for a visit in 4 weeks.   Why:  For your postpartum appointment. If you are experiencing signs of postpartum depression, please call for  an earlier appointment.   Contact information:   [place] [address] [phone]      Newborn Data: Live born female  Birth Weight: 7 lb 10.9 oz (3485 g) APGAR: 8, 9  Home with mother.  Serita Grammes CNM 11/26/2013, 8:52 AM

## 2013-11-26 NOTE — Discharge Instructions (Signed)
Postpartum Depression and Baby Blues °The postpartum period begins right after the birth of a baby. During this time, there is often a great amount of joy and excitement. It is also a time of many changes in the life of the parents. Regardless of how many times a mother gives birth, each child brings new challenges and dynamics to the family. It is not unusual to have feelings of excitement along with confusing shifts in moods, emotions, and thoughts. All mothers are at risk of developing postpartum depression or the "baby blues." These mood changes can occur right after giving birth, or they may occur many months after giving birth. The baby blues or postpartum depression can be mild or severe. Additionally, postpartum depression can go away rather quickly, or it can be a long-term condition.  °CAUSES °Raised hormone levels and the rapid drop in those levels are thought to be a main cause of postpartum depression and the baby blues. A number of hormones change during and after pregnancy. Estrogen and progesterone usually decrease right after the delivery of your baby. The levels of thyroid hormone and various cortisol steroids also rapidly drop. Other factors that play a role in these mood changes include major life events and genetics.  °RISK FACTORS °If you have any of the following risks for the baby blues or postpartum depression, know what symptoms to watch out for during the postpartum period. Risk factors that may increase the likelihood of getting the baby blues or postpartum depression include: °· Having a personal or family history of depression.   °· Having depression while being pregnant.   °· Having premenstrual mood issues or mood issues related to oral contraceptives. °· Having a lot of life stress.   °· Having marital conflict.   °· Lacking a social support network.   °· Having a baby with special needs.   °· Having health problems, such as diabetes.   °SIGNS AND SYMPTOMS °Symptoms of baby blues  include: °· Brief changes in mood, such as going from extreme happiness to sadness. °· Decreased concentration.   °· Difficulty sleeping.   °· Crying spells, tearfulness.   °· Irritability.   °· Anxiety.   °Symptoms of postpartum depression typically begin within the first month after giving birth. These symptoms include: °· Difficulty sleeping or excessive sleepiness.   °· Marked weight loss.   °· Agitation.   °· Feelings of worthlessness.   °· Lack of interest in activity or food.   °Postpartum psychosis is a very serious condition and can be dangerous. Fortunately, it is rare. Displaying any of the following symptoms is cause for immediate medical attention. Symptoms of postpartum psychosis include:  °· Hallucinations and delusions.   °· Bizarre or disorganized behavior.   °· Confusion or disorientation.   °DIAGNOSIS  °A diagnosis is made by an evaluation of your symptoms. There are no medical or lab tests that lead to a diagnosis, but there are various questionnaires that a health care provider may use to identify those with the baby blues, postpartum depression, or psychosis. Often, a screening tool called the Edinburgh Postnatal Depression Scale is used to diagnose depression in the postpartum period.  °TREATMENT °The baby blues usually goes away on its own in 1-2 weeks. Social support is often all that is needed. You will be encouraged to get adequate sleep and rest. Occasionally, you may be given medicines to help you sleep.  °Postpartum depression requires treatment because it can last several months or longer if it is not treated. Treatment may include individual or group therapy, medicine, or both to address any social, physiological, and psychological   factors that may play a role in the depression. Regular exercise, a healthy diet, rest, and social support may also be strongly recommended.  Postpartum psychosis is more serious and needs treatment right away. Hospitalization is often needed. HOME CARE  INSTRUCTIONS  Get as much rest as you can. Nap when the baby sleeps.   Exercise regularly. Some women find yoga and walking to be beneficial.   Eat a balanced and nourishing diet.   Do little things that you enjoy. Have a cup of tea, take a bubble bath, read your favorite magazine, or listen to your favorite music.  Avoid alcohol.   Ask for help with household chores, cooking, grocery shopping, or running errands as needed. Do not try to do everything.   Talk to people close to you about how you are feeling. Get support from your partner, family members, friends, or other new moms.  Try to stay positive in how you think. Think about the things you are grateful for.   Do not spend a lot of time alone.   Only take over-the-counter or prescription medicine as directed by your health care provider.  Keep all your postpartum appointments.   Let your health care provider know if you have any concerns.  SEEK MEDICAL CARE IF: You are having a reaction to or problems with your medicine. SEEK IMMEDIATE MEDICAL CARE IF:  You have suicidal feelings.   You think you may harm the baby or someone else. MAKE SURE YOU:  Understand these instructions.  Will watch your condition.  Will get help right away if you are not doing well or get worse. Document Released: 09/27/2003 Document Revised: 12/28/2012 Document Reviewed: 10/04/2012 [facility] Patient Information 2015 [place], [place]. This information is not intended to replace advice given to you by your health care provider. Make sure you discuss any questions you have with your health care provider. Postpartum Care After Cesarean Delivery After you deliver your newborn (postpartum period), the usual stay in the hospital is 24-72 hours. If there were problems with your labor or delivery, or if you have other medical problems, you might be in the hospital longer.  While you are in the hospital, you will receive help and instructions  on how to care for yourself and your newborn during the postpartum period.  While you are in the hospital:  It is normal for you to have pain or discomfort from the incision in your abdomen. Be sure to tell your nurses when you are having pain, where the pain is located, and what makes the pain worse.  If you are breastfeeding, you may feel uncomfortable contractions of your uterus for a couple of weeks. This is normal. The contractions help your uterus get back to normal size.  It is normal to have some bleeding after delivery.  For the first 1-3 days after delivery, the flow is red and the amount may be similar to a period.  It is common for the flow to start and stop.  In the first few days, you may pass some small clots. Let your nurses know if you begin to pass large clots or your flow increases.  Do not  flush blood clots down the toilet before having the nurse look at them.  During the next 3-10 days after delivery, your flow should become more watery and pink or brown-tinged in color.  Ten to fourteen days after delivery, your flow should be a small amount of yellowish-white discharge.  The amount of your flow  will decrease over the first few weeks after delivery. Your flow may stop in 6-8 weeks. Most women have had their flow stop by 12 weeks after delivery.  You should change your sanitary pads frequently.  Wash your hands thoroughly with soap and water for at least 20 seconds after changing pads, using the toilet, or before holding or feeding your newborn.  Your intravenous (IV) tubing will be removed when you are drinking enough fluids.  The urine drainage tube (urinary catheter) that was inserted before delivery may be removed within 6-8 hours after delivery or when feeling returns to your legs. You should feel like you need to empty your bladder within the first 6-8 hours after the catheter has been removed.  In case you become weak, lightheaded, or faint, call your nurse  before you get out of bed for the first time and before you take a shower for the first time.  Within the first few days after delivery, your breasts may begin to feel tender and full. This is called engorgement. Breast tenderness usually goes away within 48-72 hours after engorgement occurs. You may also notice milk leaking from your breasts. If you are not breastfeeding, do not stimulate your breasts. Breast stimulation can make your breasts produce more milk.  Spending as much time as possible with your newborn is very important. During this time, you and your newborn can feel close and get to know each other. Having your newborn stay in your room (rooming in) will help to strengthen the bond with your newborn. It will give you time to get to know your newborn and become comfortable caring for your newborn.  Your hormones change after delivery. Sometimes the hormone changes can temporarily cause you to feel sad or tearful. These feelings should not last more than a few days. If these feelings last longer than that, you should talk to your caregiver.  If desired, talk to your caregiver about methods of family planning or contraception.  Talk to your caregiver about immunizations. Your caregiver may want you to have the following immunizations before leaving the hospital:  Tetanus, diphtheria, and pertussis (Tdap) or tetanus and diphtheria (Td) immunization. It is very important that you and your family (including grandparents) or others caring for your newborn are up-to-date with the Tdap or Td immunizations. The Tdap or Td immunization can help protect your newborn from getting ill.  Rubella immunization.  Varicella (chickenpox) immunization.  Influenza immunization. You should receive this annual immunization if you did not receive the immunization during your pregnancy. Document Released: 09/17/2011 Document Reviewed: 09/17/2011 [facility] Patient Information 2015 [place]. This  information is not intended to replace advice given to you by your health care provider. Make sure you discuss any questions you have with your health care provider.

## 2013-11-26 NOTE — Plan of Care (Signed)
Problem: Discharge Progression Outcomes Goal: Barriers To Progression Addressed/Resolved Outcome: Completed/Met Date Met:  11/26/13 Goal: Complications resolved/controlled Outcome: Completed/Met Date Met:  11/26/13 Goal: Pain controlled with appropriate interventions Outcome: Completed/Met Date Met:  11/26/13 Goal: Afebrile, VS remain stable at discharge Outcome: Completed/Met Date Met:  11/26/13 Goal: Discharge plan in place and appropriate Outcome: Completed/Met Date Met:  11/26/13 Goal: Other Discharge Outcomes/Goals Outcome: Completed/Met Date Met:  11/26/13     

## 2014-06-19 ENCOUNTER — Encounter (HOSPITAL_COMMUNITY): Payer: Self-pay | Admitting: Emergency Medicine

## 2014-06-19 ENCOUNTER — Emergency Department (HOSPITAL_COMMUNITY)
Admission: EM | Admit: 2014-06-19 | Discharge: 2014-06-19 | Disposition: A | Discharge status: Home / Self Care | Payer: Medicaid Other | Attending: Emergency Medicine | Admitting: Emergency Medicine

## 2014-06-19 DIAGNOSIS — Z86018 Personal history of other benign neoplasm: Secondary | ICD-10-CM | POA: Diagnosis not present

## 2014-06-19 DIAGNOSIS — Z8619 Personal history of other infectious and parasitic diseases: Secondary | ICD-10-CM | POA: Insufficient documentation

## 2014-06-19 DIAGNOSIS — Z87891 Personal history of nicotine dependence: Secondary | ICD-10-CM | POA: Diagnosis not present

## 2014-06-19 DIAGNOSIS — R110 Nausea: Secondary | ICD-10-CM | POA: Diagnosis present

## 2014-06-19 DIAGNOSIS — J45901 Unspecified asthma with (acute) exacerbation: Secondary | ICD-10-CM | POA: Diagnosis not present

## 2014-06-19 DIAGNOSIS — R109 Unspecified abdominal pain: Secondary | ICD-10-CM | POA: Diagnosis not present

## 2014-06-19 DIAGNOSIS — Z79899 Other long term (current) drug therapy: Secondary | ICD-10-CM | POA: Diagnosis not present

## 2014-06-19 DIAGNOSIS — Z880 Allergy status to penicillin: Secondary | ICD-10-CM | POA: Insufficient documentation

## 2014-06-19 DIAGNOSIS — Z8659 Personal history of other mental and behavioral disorders: Secondary | ICD-10-CM | POA: Diagnosis not present

## 2014-06-19 DIAGNOSIS — Z3202 Encounter for pregnancy test, result negative: Secondary | ICD-10-CM | POA: Diagnosis not present

## 2014-06-19 LAB — CBC WITH DIFFERENTIAL/PLATELET
Basophils Absolute: 0 10*3/uL (ref 0.0–0.1)
Basophils Relative: 0 % (ref 0–1)
Eosinophils Absolute: 0.2 10*3/uL (ref 0.0–0.7)
Eosinophils Relative: 2 % (ref 0–5)
HCT: 38.2 % (ref 36.0–46.0)
Hemoglobin: 11.9 g/dL — ABNORMAL LOW (ref 12.0–15.0)
Lymphocytes Relative: 25 % (ref 12–46)
Lymphs Abs: 2.2 10*3/uL (ref 0.7–4.0)
MCH: 26.4 pg (ref 26.0–34.0)
MCHC: 31.2 g/dL (ref 30.0–36.0)
MCV: 84.7 fL (ref 78.0–100.0)
MONO ABS: 0.4 10*3/uL (ref 0.1–1.0)
Monocytes Relative: 5 % (ref 3–12)
NEUTROS PCT: 68 % (ref 43–77)
Neutro Abs: 6 10*3/uL (ref 1.7–7.7)
Platelets: 327 10*3/uL (ref 150–400)
RBC: 4.51 MIL/uL (ref 3.87–5.11)
RDW: 13.4 % (ref 11.5–15.5)
WBC: 8.8 10*3/uL (ref 4.0–10.5)

## 2014-06-19 LAB — COMPREHENSIVE METABOLIC PANEL
ALT: 20 U/L (ref 14–54)
AST: 18 U/L (ref 15–41)
Albumin: 3.9 g/dL (ref 3.5–5.0)
Alkaline Phosphatase: 67 U/L (ref 38–126)
Anion gap: 8 (ref 5–15)
BUN: 8 mg/dL (ref 6–20)
CALCIUM: 9.2 mg/dL (ref 8.9–10.3)
CO2: 27 mmol/L (ref 22–32)
Chloride: 102 mmol/L (ref 101–111)
Creatinine, Ser: 0.83 mg/dL (ref 0.44–1.00)
GFR calc Af Amer: >60 mL/min (ref >60)
GLUCOSE: 96 mg/dL (ref 65–99)
POTASSIUM: 4.1 mmol/L (ref 3.5–5.1)
Sodium: 137 mmol/L (ref 135–145)
TOTAL PROTEIN: 7.7 g/dL (ref 6.5–8.1)
Total Bilirubin: 0.3 mg/dL (ref 0.3–1.2)

## 2014-06-19 LAB — URINALYSIS, ROUTINE W REFLEX MICROSCOPIC
Bilirubin Urine: NEGATIVE
GLUCOSE, UA: NEGATIVE mg/dL
Hgb urine dipstick: NEGATIVE
Ketones, ur: NEGATIVE mg/dL
Nitrite: NEGATIVE
Protein, ur: NEGATIVE mg/dL
SPECIFIC GRAVITY, URINE: 1.017 (ref 1.005–1.030)
Urobilinogen, UA: 0.2 mg/dL (ref 0.0–1.0)
pH: 7 (ref 5.0–8.0)

## 2014-06-19 LAB — URINE MICROSCOPIC-ADD ON

## 2014-06-19 LAB — LIPASE, BLOOD: LIPASE: 23 U/L (ref 22–51)

## 2014-06-19 LAB — I-STAT TROPONIN, ED: Troponin i, poc: 0 ng/mL (ref 0.00–0.08)

## 2014-06-19 LAB — POC URINE PREG, ED: Preg Test, Ur: NEGATIVE

## 2014-06-19 MED ORDER — FENTANYL CITRATE (PF) 100 MCG/2ML IJ SOLN
12.5000 ug | Freq: Once | INTRAMUSCULAR | Status: AC
  Administered 2014-06-19: 12.5 ug via INTRAVENOUS
  Filled 2014-06-19: qty 2

## 2014-06-19 NOTE — ED Provider Notes (Signed)
CSN: [id number]     Arrival date & time 06/19/14  1649 History   First MD Initiated Contact with Patient 06/19/14 1737     Chief Complaint  Patient presents with  . Shortness of Breath  . Nausea     (Consider location/radiation/quality/duration/timing/severity/associated sxs/prior Treatment) Patient is a 24 y.o. female presenting with shortness of breath. The history is provided by the patient. No language interpreter was used.  Shortness of Breath Associated symptoms: abdominal pain   Associated symptoms: no headaches and no vomiting   Miss Iannaccone is a 24 year old female with a history of asthma, trichomonas, fibroid, depression who presents for intermittent, crampy right-sided abdominal pain for the past 2 days. She states she has nausea today. She denies any treatment prior to arrival. LMP: June 17, 2014. She denies using birth control. She denies any recent travel or prior DVT or PE. She denies any fever, chills, chest pain, cough, vomiting, diarrhea, dysuria, hematuria, vaginal discharge, vaginal bleeding. Last bowel movement was this afternoon. History of smoking. Past Medical History  Diagnosis Date  . Asthma     inhaler used 3x month  . Trichomonas infection   . Vaginal Pap smear, abnormal   . Depression   . Fibroid    Past Surgical History  Procedure Laterality Date  . No past surgeries    . Cesarean section N/A 11/23/2013    Procedure: CESAREAN SECTION;  Surgeon: Woodroe Mode, MD;  Location: [facility];  Service: Obstetrics;  Laterality: N/A;   No family history on file. History  Substance Use Topics  . Smoking status: Former Research scientist (life sciences)  . Smokeless tobacco: Not on file  . Alcohol Use: No   OB History    Gravida Para Term Preterm AB TAB SAB Ectopic Multiple Living   3 3 3       0 3     Review of Systems  Respiratory: Positive for shortness of breath.   Gastrointestinal: Positive for abdominal pain. Negative for vomiting and diarrhea.  Neurological: Negative for  dizziness, weakness and headaches.  All other systems reviewed and are negative.     Allergies  Penicillins  Home Medications   Prior to Admission medications   Medication Sig Start Date End Date Taking? Authorizing Provider  albuterol (PROVENTIL HFA;VENTOLIN HFA) 108 (90 BASE) MCG/ACT inhaler Inhale 2 puffs into the lungs every 6 (six) hours as needed for wheezing or shortness of breath.    Historical Provider, MD  budesonide (PULMICORT) 180 MCG/ACT inhaler Inhale 1 puff into the lungs 2 (two) times daily. 08/04/13   Aquilla Hacker, MD  ibuprofen (ADVIL,MOTRIN) 600 MG tablet Take 1 tablet (600 mg total) by mouth every 6 (six) hours as needed. 11/26/13   Myrtis Ser, CNM  oxyCODONE-acetaminophen (PERCOCET/ROXICET) 5-325 MG per tablet Take 1-2 tablets by mouth every 4 (four) hours as needed (for pain scale less than 7). 11/26/13   Myrtis Ser, CNM  Prenatal Vit-Fe Fumarate-FA (PRENATAL MULTIVITAMIN) TABS tablet Take 1 tablet by mouth daily at 12 noon.    Historical Provider, MD   BP 107/58 mmHg  Pulse 60  Temp(Src) 98.3 F (36.8 C) (Oral)  Resp 15  Ht 5\' 2"  (1.575 m)  Wt 180 lb (81.647 kg)  BMI 32.91 kg/m2  SpO2 100%  LMP 06/10/2014 Physical Exam  Constitutional: She is oriented to person, place, and time. She appears well-developed and well-nourished.  HENT:  Head: Normocephalic and atraumatic.  Eyes: Conjunctivae are normal.  Neck: Normal range of motion.  Cardiovascular: Normal rate, regular rhythm and normal heart sounds.   Pulmonary/Chest: Effort normal and breath sounds normal. No accessory muscle usage. No respiratory distress. She has no decreased breath sounds. She has no wheezes. She has no rales.  Abdominal: Soft. She exhibits no distension and no mass. There is no tenderness. There is no rigidity, no rebound, no guarding, no CVA tenderness, no tenderness at McBurney's point and negative Toothaker's sign.    Obese abdomen. Abdominal pain as diagrammed with  focal abdominal tenderness to palpation.  Musculoskeletal: Normal range of motion.  Neurological: She is alert and oriented to person, place, and time.  Skin: Skin is warm and dry.  Psychiatric: She has a normal mood and affect.  Nursing note and vitals reviewed.   ED Course  Procedures (including critical care time) Labs Review Labs Reviewed  CBC WITH DIFFERENTIAL/PLATELET - Abnormal; Notable for the following:    Hemoglobin 11.9 (*)    All other components within normal limits  URINALYSIS, ROUTINE W REFLEX MICROSCOPIC (NOT AT [facility]) - Abnormal; Notable for the following:    Leukocytes, UA SMALL (*)    All other components within normal limits  URINE MICROSCOPIC-ADD ON - Abnormal; Notable for the following:    Squamous Epithelial / LPF MANY (*)    Bacteria, UA FEW (*)    All other components within normal limits  URINE CULTURE  COMPREHENSIVE METABOLIC PANEL  LIPASE, BLOOD  I-STAT TROPOININ, ED  POC URINE PREG, ED    Imaging Review No results found.  EKG interpretation  June 19, 2014 16:53 Vent. rate 80 BPM PR interval 160 ms QRS duration 84 ms QT/QTc 386/445 ms P-R-T axes 23 63 37 Normal sinus rhythm. I interpreted this EKG, Ottie Glazier, PA-C.  MDM   Final diagnoses:  Abdominal pain in female  Patient presents for abdominal cramping and nausea for the past 2 days. Per triage notes she had shortness of breath with heart palpitations this morning but denied this when directly asked on exam. Her EKG is normal sinus rhythm at 80 bpm. No WPW. She is PERC negative. She is in no acute distress. She is on the phone. He Her vital signs are stable and she is afebrile. Her labs are unremarkable. She is mildly anemic and has been in the past, this is most likely due to her menstrual cycle ending yesterday. She states she has no longer nauseated. She has no peritoneal signs, no guarding, no rebound. No signs of appendicitis, cholecystitis, pancreatitis, or ovarian torsion . She  can follow-up with women's outpatient clinic. I have given her strict return precautions. She can take Tylenol or Motrin for pain.  Ottie Glazier, PA-C 70/62/37 [id number]  David Glick, MD 15/17/61 [id number]

## 2014-06-19 NOTE — Discharge Instructions (Signed)
Abdominal Pain, Women Return for increased abdominal pain, fever, vomiting.  Abdominal (stomach, pelvic, or belly) pain can be caused by many things. It is important to tell your doctor:  The location of the pain.  Does it come and go or is it present all the time?  Are there things that start the pain (eating certain foods, exercise)?  Are there other symptoms associated with the pain (fever, nausea, vomiting, diarrhea)? All of this is helpful to know when trying to find the cause of the pain. CAUSES   Stomach: virus or bacteria infection, or ulcer.  Intestine: appendicitis (inflamed appendix), regional ileitis (Crohn's disease), ulcerative colitis (inflamed colon), irritable bowel syndrome, diverticulitis (inflamed diverticulum of the colon), or cancer of the stomach or intestine.  Gallbladder disease or stones in the gallbladder.  Kidney disease, kidney stones, or infection.  Pancreas infection or cancer.  Fibromyalgia (pain disorder).  Diseases of the female organs:  Uterus: fibroid (non-cancerous) tumors or infection.  Fallopian tubes: infection or tubal pregnancy.  Ovary: cysts or tumors.  Pelvic adhesions (scar tissue).  Endometriosis (uterus lining tissue growing in the pelvis and on the pelvic organs).  Pelvic congestion syndrome (female organs filling up with blood just before the menstrual period).  Pain with the menstrual period.  Pain with ovulation (producing an egg).  Pain with an IUD (intrauterine device, birth control) in the uterus.  Cancer of the female organs.  Functional pain (pain not caused by a disease, may improve without treatment).  Psychological pain.  Depression. DIAGNOSIS  Your doctor will decide the seriousness of your pain by doing an examination.  Blood tests.  X-rays.  Ultrasound.  CT scan (computed tomography, special type of X-ray).  MRI (magnetic resonance imaging).  Cultures, for infection.  Barium enema (dye  inserted in the large intestine, to better view it with X-rays).  Colonoscopy (looking in intestine with a lighted tube).  Laparoscopy (minor surgery, looking in abdomen with a lighted tube).  Major abdominal exploratory surgery (looking in abdomen with a large incision). TREATMENT  The treatment will depend on the cause of the pain.   Many cases can be observed and treated at home.  Over-the-counter medicines recommended by your caregiver.  Prescription medicine.  Antibiotics, for infection.  Birth control pills, for painful periods or for ovulation pain.  Hormone treatment, for endometriosis.  Nerve blocking injections.  Physical therapy.  Antidepressants.  Counseling with a psychologist or psychiatrist.  Minor or major surgery. HOME CARE INSTRUCTIONS   Do not take laxatives, unless directed by your caregiver.  Take over-the-counter pain medicine only if ordered by your caregiver. Do not take aspirin because it can cause an upset stomach or bleeding.  Try a clear liquid diet (broth or water) as ordered by your caregiver. Slowly move to a bland diet, as tolerated, if the pain is related to the stomach or intestine.  Have a thermometer and take your temperature several times a day, and record it.  Bed rest and sleep, if it helps the pain.  Avoid sexual intercourse, if it causes pain.  Avoid stressful situations.  Keep your follow-up appointments and tests, as your caregiver orders.  If the pain does not go away with medicine or surgery, you may try:  Acupuncture.  Relaxation exercises (yoga, meditation).  Group therapy.  Counseling. SEEK MEDICAL CARE IF:   You notice certain foods cause stomach pain.  Your home care treatment is not helping your pain.  You need stronger pain medicine.  You want  your IUD removed.  You feel faint or lightheaded.  You develop nausea and vomiting.  You develop a rash.  You are having side effects or an allergy to  your medicine. SEEK IMMEDIATE MEDICAL CARE IF:   Your pain does not go away or gets worse.  You have a fever.  Your pain is felt only in portions of the abdomen. The right side could possibly be appendicitis. The left lower portion of the abdomen could be colitis or diverticulitis.  You are passing blood in your stools (bright red or black tarry stools, with or without vomiting).  You have blood in your urine.  You develop chills, with or without a fever.  You pass out. MAKE SURE YOU:   Understand these instructions.  Will watch your condition.  Will get help right away if you are not doing well or get worse. Document Released: 10/20/2006 Document Revised: 05/09/2013 Document Reviewed: 11/09/2008 [facility] Patient Information 2015 [place], [place]. This information is not intended to replace advice given to you by your health care provider. Make sure you discuss any questions you have with your health care provider.

## 2014-06-19 NOTE — ED Notes (Signed)
Pt c/o lower abdominal cramping and nausea x 2 days. lmp June 4. Pt also reports earlier today she started becoming sob with heart palpitations. Hx asthma but sts this is different. resp e/u.

## 2014-06-21 LAB — URINE CULTURE

## 2014-10-23 ENCOUNTER — Encounter (HOSPITAL_COMMUNITY): Payer: Self-pay | Admitting: Emergency Medicine

## 2014-10-23 ENCOUNTER — Emergency Department (INDEPENDENT_AMBULATORY_CARE_PROVIDER_SITE_OTHER)
Admission: EM | Admit: 2014-10-23 | Discharge: 2014-10-23 | Disposition: A | Discharge status: Home / Self Care | Payer: Managed Care, Other (non HMO) | Source: Home / Self Care | Attending: Family Medicine | Admitting: Family Medicine

## 2014-10-23 DIAGNOSIS — J4521 Mild intermittent asthma with (acute) exacerbation: Secondary | ICD-10-CM

## 2014-10-23 DIAGNOSIS — J029 Acute pharyngitis, unspecified: Secondary | ICD-10-CM | POA: Diagnosis not present

## 2014-10-23 MED ORDER — HYDROCODONE-HOMATROPINE 5-1.5 MG/5ML PO SYRP: 5.0000 mL | ORAL_SOLUTION | Freq: Four times a day (QID) | ORAL | Status: DC | PRN

## 2014-10-23 MED ORDER — AZITHROMYCIN 250 MG PO TABS: ORAL_TABLET | ORAL | Status: DC

## 2014-10-23 NOTE — ED Notes (Signed)
Pt here with c/o cough, sore throat with Hx Asthma Sx's started yesterday, afebrile Not taking medication

## 2014-10-23 NOTE — Discharge Instructions (Signed)

## 2014-10-23 NOTE — ED Provider Notes (Signed)
CSN: [id number]     Arrival date & time 10/23/14  1740 History   First MD Initiated Contact with Patient 10/23/14 1810     Chief Complaint  Patient presents with  . Cough   (Consider location/radiation/quality/duration/timing/severity/associated sxs/prior Treatment) Patient is a 24 y.o. female presenting with cough. The history is provided by the patient.  Cough Cough characteristics:  Harsh Severity:  Mild Onset quality:  Gradual Duration:  1 day Timing:  Constant Progression:  Worsening Chronicity:  New Smoker: no   Context: not occupational exposure and not sick contacts   Relieved by:  Beta-agonist inhaler Worsened by:  Nothing tried Associated symptoms: sore throat and wheezing   Associated symptoms: no chest pain, no chills, no diaphoresis and no fever   Risk factors: no chemical exposure, no recent infection and no recent travel     Past Medical History  Diagnosis Date  . Asthma     inhaler used 3x month  . Trichomonas infection   . Vaginal Pap smear, abnormal   . Depression   . Fibroid    Past Surgical History  Procedure Laterality Date  . No past surgeries    . Cesarean section N/A 11/23/2013    Procedure: CESAREAN SECTION;  Surgeon: Woodroe Mode, MD;  Location: [facility];  Service: Obstetrics;  Laterality: N/A;   No family history on file. Social History  Substance Use Topics  . Smoking status: Former Research scientist (life sciences)  . Smokeless tobacco: Not on file  . Alcohol Use: No   OB History    Gravida Para Term Preterm AB TAB SAB Ectopic Multiple Living   3 3 3       0 3     Review of Systems  Constitutional: Positive for fatigue. Negative for fever, chills, diaphoresis, activity change and appetite change.  HENT: Positive for sore throat.   Eyes: Negative.   Respiratory: Positive for cough and wheezing.   Cardiovascular: Negative for chest pain and leg swelling.  Gastrointestinal: Negative.   Genitourinary: Negative.   Musculoskeletal: Negative.   Skin: Negative.      Allergies  Penicillins  Home Medications   Prior to Admission medications   Medication Sig Start Date End Date Taking? Authorizing Provider  albuterol (PROVENTIL HFA;VENTOLIN HFA) 108 (90 BASE) MCG/ACT inhaler Inhale 2 puffs into the lungs every 6 (six) hours as needed for wheezing or shortness of breath.    Historical Provider, MD  budesonide (PULMICORT) 180 MCG/ACT inhaler Inhale 1 puff into the lungs 2 (two) times daily. 08/04/13   Aquilla Hacker, MD  ibuprofen (ADVIL,MOTRIN) 600 MG tablet Take 1 tablet (600 mg total) by mouth every 6 (six) hours as needed. 11/26/13   Myrtis Ser, CNM  oxyCODONE-acetaminophen (PERCOCET/ROXICET) 5-325 MG per tablet Take 1-2 tablets by mouth every 4 (four) hours as needed (for pain scale less than 7). 11/26/13   Myrtis Ser, CNM  Prenatal Vit-Fe Fumarate-FA (PRENATAL MULTIVITAMIN) TABS tablet Take 1 tablet by mouth daily at 12 noon.    Historical Provider, MD   Meds Ordered and Administered this Visit  Medications - No data to display  BP 108/61 mmHg  Pulse 85  Temp(Src) 98.6 F (37 C) (Oral)  Resp 20  SpO2 100% No data found.   Physical Exam  Constitutional: She is oriented to person, place, and time. She appears well-developed and well-nourished.  HENT:  Head: Normocephalic and atraumatic.  Right Ear: External ear normal.  Left Ear: External ear normal.  Mild erythema in the  posterior pharynx  Eyes: Conjunctivae and EOM are normal. Pupils are equal, round, and reactive to light.  Neck: Normal range of motion. Neck supple. No thyromegaly present.  Cardiovascular: Normal rate and normal heart sounds.   No murmur heard. Pulmonary/Chest: Effort normal. She has wheezes.  Musculoskeletal: Normal range of motion.  Lymphadenopathy:    She has no cervical adenopathy.  Neurological: She is alert and oriented to person, place, and time.  Skin: Skin is warm and dry.  Psychiatric: She has a normal mood and affect. Her behavior is  normal.  Nursing note and vitals reviewed.   ED Course  Procedures (including critical care time)    MDM  y.    ICD-9-CM ICD-10-CM   1. Asthma, mild intermittent, with acute exacerbation 493.92 J45.21 azithromycin (ZITHROMAX) 250 MG tablet     HYDROcodone-homatropine (HYDROMET) 5-1.5 MG/5ML syrup  2. Pharyngitis 462 J02.9 azithromycin (ZITHROMAX) 250 MG tablet     Signed, Robyn Haber, MD   Robyn Haber, MD 10/23/14 [phone]

## 2015-08-04 ENCOUNTER — Encounter (HOSPITAL_COMMUNITY): Payer: Self-pay | Admitting: Emergency Medicine

## 2015-08-04 ENCOUNTER — Ambulatory Visit (HOSPITAL_COMMUNITY)
Admission: EM | Admit: 2015-08-04 | Discharge: 2015-08-04 | Disposition: A | Discharge status: Home / Self Care | Payer: Medicaid Other | Attending: Emergency Medicine | Admitting: Emergency Medicine

## 2015-08-04 DIAGNOSIS — Z79899 Other long term (current) drug therapy: Secondary | ICD-10-CM | POA: Insufficient documentation

## 2015-08-04 DIAGNOSIS — B9689 Other specified bacterial agents as the cause of diseases classified elsewhere: Secondary | ICD-10-CM

## 2015-08-04 DIAGNOSIS — N760 Acute vaginitis: Secondary | ICD-10-CM | POA: Diagnosis present

## 2015-08-04 DIAGNOSIS — Z9889 Other specified postprocedural states: Secondary | ICD-10-CM | POA: Diagnosis not present

## 2015-08-04 DIAGNOSIS — Z87891 Personal history of nicotine dependence: Secondary | ICD-10-CM | POA: Insufficient documentation

## 2015-08-04 DIAGNOSIS — A499 Bacterial infection, unspecified: Secondary | ICD-10-CM

## 2015-08-04 LAB — POCT URINALYSIS DIP (DEVICE)
BILIRUBIN URINE: NEGATIVE
Glucose, UA: NEGATIVE mg/dL
KETONES UR: NEGATIVE mg/dL
NITRITE: NEGATIVE
Protein, ur: NEGATIVE mg/dL
Specific Gravity, Urine: 1.025 (ref 1.005–1.030)
Urobilinogen, UA: 0.2 mg/dL (ref 0.0–1.0)
pH: 6 (ref 5.0–8.0)

## 2015-08-04 LAB — POCT PREGNANCY, URINE: PREG TEST UR: NEGATIVE

## 2015-08-04 MED ORDER — METRONIDAZOLE 500 MG PO TABS: 500.0000 mg | ORAL_TABLET | Freq: Two times a day (BID) | ORAL | 0 refills | Status: DC

## 2015-08-04 NOTE — ED Provider Notes (Signed)
[place]    CSN: [id number] Arrival date & time: 08/04/15  1520  First Provider Contact:  First MD Initiated Contact with Patient 08/04/15 1712        History   Chief Complaint No chief complaint on file.   HPI Kathy Flores is a 25 y.o. female.   She is a 25 year old woman here for evaluation of vaginal itching and irritation. This is been present for several days. She states she has used a new soap recently. She also reports one new sexual partner. They did not use condoms. She has had some mild spotting for the last 3 weeks. This is previously been evaluated by her PCP and attributed to hormones. She denies any frank discharge. No odor. She is not using any form of birth control. She reports the urge to urinate, but denies frequency or dysuria. No abdominal pain. No fevers. She has not tried anything.      Past Medical History:  Diagnosis Date  . Asthma    inhaler used 3x month  . Depression   . Fibroid   . Trichomonas infection   . Vaginal Pap smear, abnormal     Patient Active Problem List   Diagnosis Date Noted  . Encounter for observation of infant for suspected infection   . Post term pregnancy, 41 weeks 11/22/2013  . Post-term pregnancy, 40-42 weeks of gestation   . [redacted] weeks gestation of pregnancy     Past Surgical History:  Procedure Laterality Date  . CESAREAN SECTION N/A 11/23/2013   Procedure: CESAREAN SECTION;  Surgeon: Woodroe Mode, MD;  Location: [facility];  Service: Obstetrics;  Laterality: N/A;  . NO PAST SURGERIES      OB History    Gravida Para Term Preterm AB Living   3 3 3     3    SAB TAB Ectopic Multiple Live Births         0         Home Medications    Prior to Admission medications   Medication Sig Start Date End Date Taking? Authorizing Provider  albuterol (PROVENTIL HFA;VENTOLIN HFA) 108 (90 BASE) MCG/ACT inhaler Inhale 2 puffs into the lungs every 6 (six) hours as needed for wheezing or shortness of breath.     Historical Provider, MD  azithromycin (ZITHROMAX) 250 MG tablet Take 2 tabs PO x 1 dose, then 1 tab PO QD x 4 days 10/23/14   Robyn Haber, MD  budesonide (PULMICORT) 180 MCG/ACT inhaler Inhale 1 puff into the lungs 2 (two) times daily. 08/04/13   York Ram Melancon, MD  HYDROcodone-homatropine (HYDROMET) 5-1.5 MG/5ML syrup Take 5 mLs by mouth every 6 (six) hours as needed for cough. 10/23/14   Robyn Haber, MD  ibuprofen (ADVIL,MOTRIN) 600 MG tablet Take 1 tablet (600 mg total) by mouth every 6 (six) hours as needed. 11/26/13   Myrtis Ser, CNM  metroNIDAZOLE (FLAGYL) 500 MG tablet Take 1 tablet (500 mg total) by mouth 2 (two) times daily. 08/04/15   Melony Overly, MD  Prenatal Vit-Fe Fumarate-FA (PRENATAL MULTIVITAMIN) TABS tablet Take 1 tablet by mouth daily at 12 noon.    Historical Provider, MD    Family History No family history on file.  Social History Social History  Substance Use Topics  . Smoking status: Former Research scientist (life sciences)  . Smokeless tobacco: Not on file  . Alcohol use No     Allergies   Penicillins   Review of Systems Review of Systems  Constitutional: Negative for fever.  Gastrointestinal: Negative for abdominal pain.  Genitourinary: Positive for vaginal bleeding (spotting) and vaginal pain (irritation and itching). Negative for dysuria, hematuria and vaginal discharge.     Physical Exam Triage Vital Signs ED Triage Vitals [08/04/15 1658]  Enc Vitals Group     BP 133/76     Pulse Rate 86     Resp 16     Temp 98.6 F (37 C)     Temp Source Oral     SpO2 99 %     Weight      Height      Head Circumference      Peak Flow      Pain Score      Pain Loc      Pain Edu?      Excl. in [place]?    No data found.   Updated Vital Signs BP 133/76 (BP Location: Right Arm)   Pulse 86   Temp 98.6 F (37 C) (Oral)   Resp 16   SpO2 99%   Visual Acuity Right Eye Distance:   Left Eye Distance:   Bilateral Distance:    Right Eye Near:   Left Eye Near:      Bilateral Near:     Physical Exam  Constitutional: She is oriented to person, place, and time. She appears well-developed and well-nourished. No distress.  Cardiovascular: Normal rate.   Pulmonary/Chest: Effort normal.  Genitourinary: There is no rash on the right labia. There is no rash on the left labia. Cervix exhibits no motion tenderness and no discharge. No bleeding in the vagina. No foreign body in the vagina. Vaginal discharge (greenish and frothy with an odor) found.  Neurological: She is alert and oriented to person, place, and time.     UC Treatments / Results  Labs (all labs ordered are listed, but only abnormal results are displayed) Labs Reviewed  POCT URINALYSIS DIP (DEVICE) - Abnormal; Notable for the following:       Result Value   Hgb urine dipstick SMALL (*)    Leukocytes, UA SMALL (*)    All other components within normal limits  POCT PREGNANCY, URINE  CERVICOVAGINAL ANCILLARY ONLY    EKG  EKG Interpretation None       Radiology No results found.  Procedures Procedures (including critical care time)  Medications Ordered in UC Medications - No data to display   Initial Impression / Assessment and Plan / UC Course  I have reviewed the triage vital signs and the nursing notes.  Pertinent labs & imaging results that were available during my care of the patient were reviewed by me and considered in my medical decision making (see chart for details).  Clinical Course    Likely BV given that she has used a new products recently. Vaginal swabs collected and sent for testing. Treat presumptively with Flagyl. Follow-up as needed.  Final Clinical Impressions(s) / UC Diagnoses   Final diagnoses:  BV (bacterial vaginosis)    New Prescriptions New Prescriptions   METRONIDAZOLE (FLAGYL) 500 MG TABLET    Take 1 tablet (500 mg total) by mouth 2 (two) times daily.     Melony Overly, MD 08/04/15 1750

## 2015-08-04 NOTE — Discharge Instructions (Signed)
It looks like you have BV. This is a bacterial infection that is caused by a disruption in the pH of the vagina. Take Flagyl twice a day for 7 days. Do not drink alcohol while taking this medicine. We will call you with the results of your testing in 2-3 days. Follow-up as needed.

## 2015-08-04 NOTE — ED Triage Notes (Signed)
Patient c/o vaginal irritation and odor. She reports she recently changed her body wash and thinks it may be from that. Patient is in NAD.

## 2015-08-06 LAB — CERVICOVAGINAL ANCILLARY ONLY
Chlamydia: NEGATIVE
Neisseria Gonorrhea: NEGATIVE

## 2015-08-07 LAB — CERVICOVAGINAL ANCILLARY ONLY: WET PREP (BD AFFIRM): POSITIVE — AB

## 2015-08-16 ENCOUNTER — Telehealth (HOSPITAL_COMMUNITY): Payer: Self-pay | Admitting: *Deleted

## 2015-08-16 ENCOUNTER — Other Ambulatory Visit (HOSPITAL_COMMUNITY): Payer: Self-pay | Admitting: Family Medicine

## 2015-08-20 ENCOUNTER — Emergency Department (HOSPITAL_COMMUNITY): Payer: Medicaid Other

## 2015-08-20 ENCOUNTER — Emergency Department (HOSPITAL_COMMUNITY)
Admission: EM | Admit: 2015-08-20 | Discharge: 2015-08-21 | Disposition: A | Discharge status: Home / Self Care | Payer: Medicaid Other | Attending: Emergency Medicine | Admitting: Emergency Medicine

## 2015-08-20 ENCOUNTER — Encounter (HOSPITAL_COMMUNITY): Payer: Self-pay | Admitting: *Deleted

## 2015-08-20 DIAGNOSIS — R059 Cough, unspecified: Secondary | ICD-10-CM

## 2015-08-20 DIAGNOSIS — J4520 Mild intermittent asthma, uncomplicated: Secondary | ICD-10-CM | POA: Diagnosis not present

## 2015-08-20 DIAGNOSIS — Z87891 Personal history of nicotine dependence: Secondary | ICD-10-CM | POA: Diagnosis not present

## 2015-08-20 DIAGNOSIS — R05 Cough: Secondary | ICD-10-CM | POA: Diagnosis present

## 2015-08-20 LAB — BASIC METABOLIC PANEL
Anion gap: 7 (ref 5–15)
BUN: 10 mg/dL (ref 6–20)
CALCIUM: 9.3 mg/dL (ref 8.9–10.3)
CO2: 25 mmol/L (ref 22–32)
Chloride: 102 mmol/L (ref 101–111)
Creatinine, Ser: 0.77 mg/dL (ref 0.44–1.00)
Glucose, Bld: 93 mg/dL (ref 65–99)
Potassium: 4 mmol/L (ref 3.5–5.1)
Sodium: 134 mmol/L — ABNORMAL LOW (ref 135–145)

## 2015-08-20 LAB — CBC
HEMATOCRIT: 40.5 % (ref 36.0–46.0)
Hemoglobin: 12.7 g/dL (ref 12.0–15.0)
MCH: 26.3 pg (ref 26.0–34.0)
MCHC: 31.4 g/dL (ref 30.0–36.0)
MCV: 83.9 fL (ref 78.0–100.0)
PLATELETS: 299 10*3/uL (ref 150–400)
RBC: 4.83 MIL/uL (ref 3.87–5.11)
RDW: 14.7 % (ref 11.5–15.5)
WBC: 8.8 10*3/uL (ref 4.0–10.5)

## 2015-08-20 LAB — I-STAT BETA HCG BLOOD, ED (MC, WL, AP ONLY)

## 2015-08-20 MED ORDER — IPRATROPIUM BROMIDE 0.02 % IN SOLN
0.5000 mg | Freq: Once | RESPIRATORY_TRACT | Status: AC
  Administered 2015-08-20: 0.5 mg via RESPIRATORY_TRACT
  Filled 2015-08-20: qty 2.5

## 2015-08-20 MED ORDER — BUDESONIDE 180 MCG/ACT IN AEPB: 1.0000 | INHALATION_SPRAY | Freq: Two times a day (BID) | RESPIRATORY_TRACT | 1 refills | Status: DC

## 2015-08-20 MED ORDER — ALBUTEROL SULFATE (2.5 MG/3ML) 0.083% IN NEBU
5.0000 mg | INHALATION_SOLUTION | Freq: Once | RESPIRATORY_TRACT | Status: AC
  Administered 2015-08-20: 5 mg via RESPIRATORY_TRACT
  Filled 2015-08-20: qty 6

## 2015-08-20 MED ORDER — ALBUTEROL SULFATE (5 MG/ML) 0.5% IN NEBU: 2.5000 mg | INHALATION_SOLUTION | Freq: Four times a day (QID) | RESPIRATORY_TRACT | 0 refills | Status: DC | PRN

## 2015-08-20 MED ORDER — PREDNISONE 20 MG PO TABS
60.0000 mg | ORAL_TABLET | Freq: Once | ORAL | Status: AC
  Administered 2015-08-20: 60 mg via ORAL
  Filled 2015-08-20: qty 3

## 2015-08-20 MED ORDER — PREDNISONE 20 MG PO TABS: ORAL_TABLET | ORAL | 0 refills | Status: DC

## 2015-08-20 NOTE — ED Provider Notes (Signed)
[facility] DEPT Provider Note   CSN: [id number] Arrival date & time: 08/20/15  1815     History   Chief Complaint Chief Complaint  Patient presents with  . Chest Pain  . Cough    HPI Kathy Flores is a 25 y.o. female.  The history is provided by the patient and medical records.    25 y.o. F with hx of asthma, depression, fibroids, presenting to the ED for productive cough and chest pain which began yesterday.  States sputum with cough is unknown color.  She states pain in her chest is localized to left ribs, occurs with coughing, deep breaths, and moving left arm across her body.  States she feels SOB and has been wheezing.  She has tried using her pulmicort inhaler without relief and she ran out of it this afternoon.  She denies personal or family cardiac hx.  Patient is an occasional smoker.  States her asthma does tend to flare up when the weather changes like it has done recently.  She denies hx of DVT or PE.  No recent travel, surgeries, prolonged immobilization, or exogenous estrogens.    Past Medical History:  Diagnosis Date  . Asthma    inhaler used 3x month  . Depression   . Fibroid   . Trichomonas infection   . Vaginal Pap smear, abnormal     Patient Active Problem List   Diagnosis Date Noted  . Encounter for observation of infant for suspected infection   . Post term pregnancy, 41 weeks 11/22/2013  . Post-term pregnancy, 40-42 weeks of gestation   . [redacted] weeks gestation of pregnancy     Past Surgical History:  Procedure Laterality Date  . CESAREAN SECTION N/A 11/23/2013   Procedure: CESAREAN SECTION;  Surgeon: Woodroe Mode, MD;  Location: [facility];  Service: Obstetrics;  Laterality: N/A;  . NO PAST SURGERIES      OB History    Gravida Para Term Preterm AB Living   3 3 3     3    SAB TAB Ectopic Multiple Live Births         0 3       Home Medications    Prior to Admission medications   Medication Sig Start Date End Date Taking? Authorizing  Provider  albuterol (PROVENTIL HFA;VENTOLIN HFA) 108 (90 BASE) MCG/ACT inhaler Inhale 2 puffs into the lungs every 6 (six) hours as needed for wheezing or shortness of breath.    Historical Provider, MD  azithromycin (ZITHROMAX) 250 MG tablet Take 2 tabs PO x 1 dose, then 1 tab PO QD x 4 days 10/23/14   Robyn Haber, MD  budesonide (PULMICORT) 180 MCG/ACT inhaler Inhale 1 puff into the lungs 2 (two) times daily. 08/04/13   York Ram Melancon, MD  HYDROcodone-homatropine (HYDROMET) 5-1.5 MG/5ML syrup Take 5 mLs by mouth every 6 (six) hours as needed for cough. 10/23/14   Robyn Haber, MD  ibuprofen (ADVIL,MOTRIN) 600 MG tablet Take 1 tablet (600 mg total) by mouth every 6 (six) hours as needed. 11/26/13   Myrtis Ser, CNM  metroNIDAZOLE (FLAGYL) 500 MG tablet Take 1 tablet (500 mg total) by mouth 2 (two) times daily. 08/04/15   Melony Overly, MD  Prenatal Vit-Fe Fumarate-FA (PRENATAL MULTIVITAMIN) TABS tablet Take 1 tablet by mouth daily at 12 noon.    Historical Provider, MD    Family History History reviewed. No pertinent family history.  Social History Social History  Substance Use Topics  .  Smoking status: Former Research scientist (life sciences)  . Smokeless tobacco: Never Used  . Alcohol use No     Allergies   Penicillins   Review of Systems Review of Systems  Respiratory: Positive for cough, shortness of breath and wheezing.   Cardiovascular: Positive for chest pain.  All other systems reviewed and are negative.    Physical Exam Updated Vital Signs BP 118/82 (BP Location: Left Arm)   Pulse 64   Temp 98.2 F (36.8 C) (Oral)   Resp 16   Ht 5' 1.5" (1.562 m)   Wt 94.6 kg   LMP 08/01/2015   SpO2 100%   BMI 38.78 kg/m   Physical Exam  Constitutional: She is oriented to person, place, and time. She appears well-developed and well-nourished.  HENT:  Head: Normocephalic and atraumatic.  Mouth/Throat: Oropharynx is clear and moist.  Eyes: Conjunctivae and EOM are normal. Pupils are equal,  round, and reactive to light.  Neck: Normal range of motion.  Cardiovascular: Normal rate, regular rhythm and normal heart sounds.   Pulmonary/Chest: Effort normal. She has wheezes.  Left ribs are mildly TTP without noted deformity or crepitus; expiratory wheezes throughout, more pronounced at bases  Abdominal: Soft. Bowel sounds are normal.  Musculoskeletal: Normal range of motion.  Neurological: She is alert and oriented to person, place, and time.  Skin: Skin is warm and dry.  Psychiatric: She has a normal mood and affect.  Nursing note and vitals reviewed.    ED Treatments / Results  Labs (all labs ordered are listed, but only abnormal results are displayed) Labs Reviewed  BASIC METABOLIC PANEL - Abnormal; Notable for the following:       Result Value   Sodium 134 (*)    All other components within normal limits  CBC  I-STAT BETA HCG BLOOD, ED ([facility], [facility], [facility] ONLY)    EKG  EKG Interpretation None       Radiology Dg Chest 2 View  Result Date: 08/20/2015 CLINICAL DATA:  Chest pain and cough EXAM: CHEST  2 VIEW COMPARISON:  November 04, 2010 FINDINGS: There is no edema or consolidation. The heart size and pulmonary vascularity are normal. No adenopathy. No pneumothorax. No bone lesions. IMPRESSION: No edema or consolidation. Electronically Signed   By: Lowella Grip III M.D.   On: 08/20/2015 19:32    Procedures Procedures (including critical care time)  Medications Ordered in ED Medications  albuterol (PROVENTIL) (2.5 MG/3ML) 0.083% nebulizer solution 5 mg (5 mg Nebulization Given 08/20/15 2151)  ipratropium (ATROVENT) nebulizer solution 0.5 mg (0.5 mg Nebulization Given 08/20/15 2151)  predniSONE (DELTASONE) tablet 60 mg (60 mg Oral Given 08/20/15 2151)     Initial Impression / Assessment and Plan / ED Course  I have reviewed the triage vital signs and the nursing notes.  Pertinent labs & imaging results that were available during my care of the patient were  reviewed by me and considered in my medical decision making (see chart for details).  Clinical Course   25 year old female here with chest pain and cough that began yesterday. She is afebrile and nontoxic. She does have diffuse expiratory wheezes on exam, worse at the bases. Her left ribs are somewhat tender to palpation without noted deformity. Her labwork is reassuring. Chest x-ray is clear. EKG is nonischemic. Suspect her symptoms are related to her asthma and coughing.  Will treat with prednisone, albuterol, atrovent and reassess.  Patient feeling better after treatments here.  VS remain stable on RA.  Tolerating fluids thought  difficulty. Continue to feel her symptoms due to asthma and cough.  Lower suspicion for ACS, PE, dissection, or other acute cardiac event at this time.  Patient is PERC negative.  Will discharge home. Refilled her home Pulmicort inhaler, prednisone taper. Also written for neb machine and solution as I feel this will help her. She'll follow-up with her primary care doctor.  Discussed plan with patient, she acknowledged understanding and agreed with plan of care.  Return precautions given for new or worsening symptoms.  Final Clinical Impressions(s) / ED Diagnoses   Final diagnoses:  Cough  Asthma, mild intermittent, uncomplicated    New Prescriptions New Prescriptions   ALBUTEROL (PROVENTIL) (5 MG/ML) 0.5% NEBULIZER SOLUTION    Take 0.5 mLs (2.5 mg total) by nebulization every 6 (six) hours as needed for wheezing or shortness of breath.   BUDESONIDE (PULMICORT) 180 MCG/ACT INHALER    Inhale 1 puff into the lungs 2 (two) times daily.   PREDNISONE (DELTASONE) 20 MG TABLET    Take 40 mg by mouth daily for 3 days, then 20mg  by mouth daily for 3 days, then 10mg  daily for 3 days     Larene Pickett, PA-C 08/21/15 0003    Larene Pickett, PA-C 08/21/15 [id number]    Blanchie Dessert, MD 08/21/15 2041

## 2015-08-20 NOTE — ED Notes (Signed)
Gave pt ice chips per Robby-RN.

## 2015-08-20 NOTE — Discharge Instructions (Signed)
Take the prescribed medication as directed.  Start prednisone tomorrow, you have already had today's dose.   Use nebulizer treatments every 4-6 hours as needed. Follow-up with your primary care doctor. Return to the ED for new or worsening symptoms.

## 2015-08-20 NOTE — ED Triage Notes (Signed)
Pt reports having a productive cough for several days. Having chest pain and sob, pain occurs more with breathing and coughing. No acute distress noted. Hx of asthma. Airway intact.

## 2015-08-20 NOTE — ED Notes (Signed)
Gave pt ice chips, per Dr. Maryan Rued.

## 2015-08-29 NOTE — Telephone Encounter (Signed)
Called and gave pt lab results... Reports sx have subsided and finished Flagyl  Adv pt to return if not feeling any better.

## 2015-08-29 NOTE — Telephone Encounter (Signed)
-----   Message from Sherlene Shams, MD sent at 08/09/2015  8:34 AM EDT ----- Please let patient know that tests for garderella (bacterial vaginosis) and trichomonas were positive.  Rx for metronidazole was given at the [facility] visit 08/04/15.  Recheck as needed if symptoms persist.  LM

## 2015-09-23 ENCOUNTER — Emergency Department (HOSPITAL_COMMUNITY): Payer: Medicaid Other

## 2015-09-23 ENCOUNTER — Inpatient Hospital Stay (HOSPITAL_COMMUNITY)
Admission: EM | Admit: 2015-09-23 | Discharge: 2015-09-28 | DRG: 871 | Disposition: A | Discharge status: Home / Self Care | Payer: Medicaid Other | Attending: Internal Medicine | Admitting: Internal Medicine

## 2015-09-23 ENCOUNTER — Encounter (HOSPITAL_COMMUNITY): Payer: Self-pay

## 2015-09-23 DIAGNOSIS — E872 Acidosis, unspecified: Secondary | ICD-10-CM

## 2015-09-23 DIAGNOSIS — N739 Female pelvic inflammatory disease, unspecified: Secondary | ICD-10-CM | POA: Diagnosis present

## 2015-09-23 DIAGNOSIS — J45901 Unspecified asthma with (acute) exacerbation: Secondary | ICD-10-CM

## 2015-09-23 DIAGNOSIS — E876 Hypokalemia: Secondary | ICD-10-CM | POA: Diagnosis present

## 2015-09-23 DIAGNOSIS — R079 Chest pain, unspecified: Secondary | ICD-10-CM

## 2015-09-23 DIAGNOSIS — D649 Anemia, unspecified: Secondary | ICD-10-CM | POA: Diagnosis present

## 2015-09-23 DIAGNOSIS — J4521 Mild intermittent asthma with (acute) exacerbation: Secondary | ICD-10-CM | POA: Diagnosis present

## 2015-09-23 DIAGNOSIS — R6520 Severe sepsis without septic shock: Secondary | ICD-10-CM | POA: Diagnosis present

## 2015-09-23 DIAGNOSIS — N730 Acute parametritis and pelvic cellulitis: Secondary | ICD-10-CM

## 2015-09-23 DIAGNOSIS — Z87891 Personal history of nicotine dependence: Secondary | ICD-10-CM

## 2015-09-23 DIAGNOSIS — A419 Sepsis, unspecified organism: Secondary | ICD-10-CM

## 2015-09-23 DIAGNOSIS — R000 Tachycardia, unspecified: Secondary | ICD-10-CM

## 2015-09-23 DIAGNOSIS — J069 Acute upper respiratory infection, unspecified: Secondary | ICD-10-CM | POA: Diagnosis present

## 2015-09-23 DIAGNOSIS — J9601 Acute respiratory failure with hypoxia: Secondary | ICD-10-CM | POA: Diagnosis present

## 2015-09-23 DIAGNOSIS — R0682 Tachypnea, not elsewhere classified: Secondary | ICD-10-CM

## 2015-09-23 DIAGNOSIS — A599 Trichomoniasis, unspecified: Secondary | ICD-10-CM | POA: Diagnosis present

## 2015-09-23 DIAGNOSIS — R6510 Systemic inflammatory response syndrome (SIRS) of non-infectious origin without acute organ dysfunction: Secondary | ICD-10-CM

## 2015-09-23 LAB — I-STAT VENOUS BLOOD GAS, ED
Acid-base deficit: 7 mmol/L — ABNORMAL HIGH (ref 0.0–2.0)
Bicarbonate: 18 mmol/L — ABNORMAL LOW (ref 20.0–28.0)
O2 SAT: 98 %
TCO2: 19 mmol/L (ref 0–100)
pCO2, Ven: 33.1 mmHg — ABNORMAL LOW (ref 44.0–60.0)
pH, Ven: 7.343 (ref 7.250–7.430)
pO2, Ven: 101 mmHg — ABNORMAL HIGH (ref 32.0–45.0)

## 2015-09-23 LAB — BASIC METABOLIC PANEL
ANION GAP: 12 (ref 5–15)
BUN: 6 mg/dL (ref 6–20)
CALCIUM: 8.8 mg/dL — AB (ref 8.9–10.3)
CO2: 19 mmol/L — ABNORMAL LOW (ref 22–32)
CREATININE: 1.02 mg/dL — AB (ref 0.44–1.00)
Chloride: 103 mmol/L (ref 101–111)
Glucose, Bld: 195 mg/dL — ABNORMAL HIGH (ref 65–99)
Potassium: 3.1 mmol/L — ABNORMAL LOW (ref 3.5–5.1)
SODIUM: 134 mmol/L — AB (ref 135–145)

## 2015-09-23 LAB — I-STAT CG4 LACTIC ACID, ED
LACTIC ACID, VENOUS: 4.26 mmol/L — AB (ref 0.5–1.9)
Lactic Acid, Venous: 8.13 mmol/L (ref 0.5–1.9)
Lactic Acid, Venous: 8.45 mmol/L (ref 0.5–1.9)
Lactic Acid, Venous: 6.71 mmol/L (ref 0.5–1.9)

## 2015-09-23 LAB — CBC
HCT: 29.1 % — ABNORMAL LOW (ref 36.0–46.0)
HEMOGLOBIN: 8.9 g/dL — AB (ref 12.0–15.0)
MCH: 25.1 pg — ABNORMAL LOW (ref 26.0–34.0)
MCHC: 30.6 g/dL (ref 30.0–36.0)
MCV: 82 fL (ref 78.0–100.0)
Platelets: 336 10*3/uL (ref 150–400)
RBC: 3.55 MIL/uL — ABNORMAL LOW (ref 3.87–5.11)
RDW: 14.9 % (ref 11.5–15.5)
WBC: 11.9 10*3/uL — AB (ref 4.0–10.5)

## 2015-09-23 LAB — I-STAT ARTERIAL BLOOD GAS, ED
Acid-base deficit: 14 mmol/L — ABNORMAL HIGH (ref 0.0–2.0)
BICARBONATE: 11.4 mmol/L — AB (ref 20.0–28.0)
O2 Saturation: 95 %
PCO2 ART: 25.4 mmHg — AB (ref 32.0–48.0)
TCO2: 12 mmol/L (ref 0–100)
pH, Arterial: 7.262 — ABNORMAL LOW (ref 7.350–7.450)
pO2, Arterial: 84 mmHg (ref 83.0–108.0)

## 2015-09-23 LAB — CBC WITH DIFFERENTIAL/PLATELET
BASOS ABS: 0 10*3/uL (ref 0.0–0.1)
BASOS PCT: 0 %
EOS ABS: 0.1 10*3/uL (ref 0.0–0.7)
Eosinophils Relative: 0 %
HCT: 31.9 % — ABNORMAL LOW (ref 36.0–46.0)
Hemoglobin: 9.6 g/dL — ABNORMAL LOW (ref 12.0–15.0)
Lymphocytes Relative: 7 %
Lymphs Abs: 1 10*3/uL (ref 0.7–4.0)
MCH: 25 pg — ABNORMAL LOW (ref 26.0–34.0)
MCHC: 30.1 g/dL (ref 30.0–36.0)
MCV: 83.1 fL (ref 78.0–100.0)
MONO ABS: 0.3 10*3/uL (ref 0.1–1.0)
MONOS PCT: 2 %
NEUTROS ABS: 13.3 10*3/uL — AB (ref 1.7–7.7)
NEUTROS PCT: 91 %
Platelets: 245 10*3/uL (ref 150–400)
RBC: 3.84 MIL/uL — ABNORMAL LOW (ref 3.87–5.11)
RDW: 14.7 % (ref 11.5–15.5)
WBC: 14.6 10*3/uL — ABNORMAL HIGH (ref 4.0–10.5)

## 2015-09-23 LAB — I-STAT TROPONIN, ED
TROPONIN I, POC: 0.01 ng/mL (ref 0.00–0.08)
Troponin i, poc: 0.07 ng/mL (ref 0.00–0.08)

## 2015-09-23 LAB — URINALYSIS, ROUTINE W REFLEX MICROSCOPIC
Bilirubin Urine: NEGATIVE
GLUCOSE, UA: 100 mg/dL — AB
Hgb urine dipstick: NEGATIVE
Ketones, ur: NEGATIVE mg/dL
Nitrite: NEGATIVE
PH: 5 (ref 5.0–8.0)
PROTEIN: NEGATIVE mg/dL
Specific Gravity, Urine: 1.002 — ABNORMAL LOW (ref 1.005–1.030)

## 2015-09-23 LAB — I-STAT CHEM 8, ED
BUN: 5 mg/dL — ABNORMAL LOW (ref 6–20)
CALCIUM ION: 1.12 mmol/L — AB (ref 1.15–1.40)
CHLORIDE: 100 mmol/L — AB (ref 101–111)
Creatinine, Ser: 0.9 mg/dL (ref 0.44–1.00)
GLUCOSE: 205 mg/dL — AB (ref 65–99)
HEMATOCRIT: 32 % — AB (ref 36.0–46.0)
HEMOGLOBIN: 10.9 g/dL — AB (ref 12.0–15.0)
Potassium: 2.9 mmol/L — ABNORMAL LOW (ref 3.5–5.1)
Sodium: 137 mmol/L (ref 135–145)
TCO2: 22 mmol/L (ref 0–100)

## 2015-09-23 LAB — LACTIC ACID, PLASMA: Lactic Acid, Venous: 4.2 mmol/L (ref 0.5–1.9)

## 2015-09-23 LAB — I-STAT BETA HCG BLOOD, ED (MC, WL, AP ONLY): I-stat hCG, quantitative: <5 m[IU]/mL (ref <5)

## 2015-09-23 LAB — CREATININE, SERUM
CREATININE: 0.95 mg/dL (ref 0.44–1.00)
GFR calc Af Amer: >60 mL/min (ref >60)

## 2015-09-23 LAB — WET PREP, GENITAL
Clue Cells Wet Prep HPF POC: NONE SEEN
SPERM: NONE SEEN
YEAST WET PREP: NONE SEEN

## 2015-09-23 LAB — PROCALCITONIN: PROCALCITONIN: 0.23 ng/mL

## 2015-09-23 LAB — MAGNESIUM: Magnesium: 2 mg/dL (ref 1.7–2.4)

## 2015-09-23 LAB — URINE MICROSCOPIC-ADD ON

## 2015-09-23 LAB — CBG MONITORING, ED: GLUCOSE-CAPILLARY: 207 mg/dL — AB (ref 65–99)

## 2015-09-23 MED ORDER — ALBUTEROL SULFATE (2.5 MG/3ML) 0.083% IN NEBU
5.0000 mg | INHALATION_SOLUTION | Freq: Once | RESPIRATORY_TRACT | Status: AC
  Administered 2015-09-23: 5 mg via RESPIRATORY_TRACT
  Filled 2015-09-23: qty 6

## 2015-09-23 MED ORDER — BUDESONIDE 0.5 MG/2ML IN SUSP
0.2500 mg | Freq: Two times a day (BID) | RESPIRATORY_TRACT | Status: DC
  Administered 2015-09-24 (×2): 0.5 mg via RESPIRATORY_TRACT
  Administered 2015-09-24: 0.25 mg via RESPIRATORY_TRACT
  Administered 2015-09-25: 0.5 mg via RESPIRATORY_TRACT
  Administered 2015-09-25 – 2015-09-26 (×2): 0.25 mg via RESPIRATORY_TRACT
  Administered 2015-09-26: 0.5 mg via RESPIRATORY_TRACT
  Filled 2015-09-23 (×10): qty 2

## 2015-09-23 MED ORDER — SODIUM CHLORIDE 0.9 % IV BOLUS (SEPSIS)
1000.0000 mL | Freq: Once | INTRAVENOUS | Status: AC
  Administered 2015-09-23: 1000 mL via INTRAVENOUS

## 2015-09-23 MED ORDER — IOPAMIDOL (ISOVUE-370) INJECTION 76%
100.0000 mL | Freq: Once | INTRAVENOUS | Status: AC | PRN
  Administered 2015-09-23: 100 mL via INTRAVENOUS

## 2015-09-23 MED ORDER — DEXTROSE 5 % IV SOLN: 2.0000 g | Freq: Once | INTRAVENOUS | Status: DC

## 2015-09-23 MED ORDER — METHYLPREDNISOLONE SODIUM SUCC 40 MG IJ SOLR
40.0000 mg | Freq: Two times a day (BID) | INTRAMUSCULAR | Status: DC
  Administered 2015-09-23 – 2015-09-24 (×2): 40 mg via INTRAVENOUS
  Filled 2015-09-23 (×2): qty 1

## 2015-09-23 MED ORDER — KETOROLAC TROMETHAMINE 30 MG/ML IJ SOLN
15.0000 mg | Freq: Once | INTRAMUSCULAR | Status: AC
  Administered 2015-09-23: 15 mg via INTRAVENOUS
  Filled 2015-09-23: qty 1

## 2015-09-23 MED ORDER — NOREPINEPHRINE BITARTRATE 1 MG/ML IV SOLN
0.0000 ug/min | Freq: Once | INTRAVENOUS | Status: DC
  Filled 2015-09-23: qty 4

## 2015-09-23 MED ORDER — ALBUTEROL (5 MG/ML) CONTINUOUS INHALATION SOLN
10.0000 mg/h | INHALATION_SOLUTION | Freq: Once | RESPIRATORY_TRACT | Status: AC
  Administered 2015-09-23: 10 mg/h via RESPIRATORY_TRACT
  Filled 2015-09-23: qty 20

## 2015-09-23 MED ORDER — DEXTROSE 5 % IV SOLN
0.0000 ug/min | Freq: Once | INTRAVENOUS | Status: DC
  Filled 2015-09-23: qty 4

## 2015-09-23 MED ORDER — SODIUM CHLORIDE 0.9 % IV BOLUS (SEPSIS): 1000.0000 mL | Freq: Once | INTRAVENOUS | Status: DC

## 2015-09-23 MED ORDER — HYDROCODONE-ACETAMINOPHEN 7.5-325 MG/15ML PO SOLN
10.0000 mL | Freq: Once | ORAL | Status: AC
  Administered 2015-09-23: 10 mL via ORAL
  Filled 2015-09-23: qty 15

## 2015-09-23 MED ORDER — IPRATROPIUM-ALBUTEROL 0.5-2.5 (3) MG/3ML IN SOLN: 3.0000 mL | RESPIRATORY_TRACT | Status: DC | PRN

## 2015-09-23 MED ORDER — IPRATROPIUM-ALBUTEROL 0.5-2.5 (3) MG/3ML IN SOLN
3.0000 mL | RESPIRATORY_TRACT | Status: DC
  Administered 2015-09-23 – 2015-09-25 (×10): 3 mL via RESPIRATORY_TRACT
  Filled 2015-09-23 (×9): qty 3

## 2015-09-23 MED ORDER — VANCOMYCIN HCL IN DEXTROSE 1-5 GM/200ML-% IV SOLN: 1000.0000 mg | Freq: Once | INTRAVENOUS | Status: DC

## 2015-09-23 MED ORDER — LEVOFLOXACIN IN D5W 750 MG/150ML IV SOLN
750.0000 mg | Freq: Once | INTRAVENOUS | Status: AC
  Administered 2015-09-23: 750 mg via INTRAVENOUS
  Filled 2015-09-23: qty 150

## 2015-09-23 MED ORDER — ACETAMINOPHEN 650 MG RE SUPP: 650.0000 mg | Freq: Four times a day (QID) | RECTAL | Status: DC | PRN

## 2015-09-23 MED ORDER — LEVOFLOXACIN IN D5W 750 MG/150ML IV SOLN
750.0000 mg | INTRAVENOUS | Status: DC
  Administered 2015-09-25 – 2015-09-26 (×3): 750 mg via INTRAVENOUS
  Filled 2015-09-23 (×5): qty 150

## 2015-09-23 MED ORDER — IPRATROPIUM-ALBUTEROL 0.5-2.5 (3) MG/3ML IN SOLN
3.0000 mL | RESPIRATORY_TRACT | Status: DC
  Filled 2015-09-23: qty 3

## 2015-09-23 MED ORDER — ACETAMINOPHEN 325 MG PO TABS: 650.0000 mg | ORAL_TABLET | Freq: Four times a day (QID) | ORAL | Status: DC | PRN

## 2015-09-23 MED ORDER — MAGNESIUM SULFATE 2 GM/50ML IV SOLN
2.0000 g | Freq: Once | INTRAVENOUS | Status: AC
  Administered 2015-09-23: 2 g via INTRAVENOUS
  Filled 2015-09-23: qty 50

## 2015-09-23 MED ORDER — METRONIDAZOLE IN NACL 5-0.79 MG/ML-% IV SOLN
500.0000 mg | Freq: Once | INTRAVENOUS | Status: AC
  Administered 2015-09-23: 500 mg via INTRAVENOUS
  Filled 2015-09-23: qty 100

## 2015-09-23 MED ORDER — SODIUM CHLORIDE 0.9 % IV SOLN
INTRAVENOUS | Status: AC
  Administered 2015-09-23 – 2015-09-24 (×5): via INTRAVENOUS

## 2015-09-23 MED ORDER — PREDNISONE 50 MG PO TABS
60.0000 mg | ORAL_TABLET | Freq: Every day | ORAL | Status: DC
  Administered 2015-09-24 – 2015-09-25 (×2): 60 mg via ORAL
  Filled 2015-09-23 (×2): qty 1

## 2015-09-23 MED ORDER — POTASSIUM CHLORIDE CRYS ER 20 MEQ PO TBCR
60.0000 meq | EXTENDED_RELEASE_TABLET | Freq: Once | ORAL | Status: AC
  Administered 2015-09-23: 60 meq via ORAL
  Filled 2015-09-23: qty 3

## 2015-09-23 MED ORDER — IPRATROPIUM BROMIDE 0.02 % IN SOLN
1.0000 mg | Freq: Once | RESPIRATORY_TRACT | Status: AC
  Administered 2015-09-23: 1 mg via RESPIRATORY_TRACT
  Filled 2015-09-23: qty 5

## 2015-09-23 MED ORDER — IPRATROPIUM BROMIDE 0.02 % IN SOLN
0.5000 mg | Freq: Once | RESPIRATORY_TRACT | Status: AC
  Administered 2015-09-23: 0.5 mg via RESPIRATORY_TRACT
  Filled 2015-09-23: qty 2.5

## 2015-09-23 MED ORDER — METRONIDAZOLE IN NACL 5-0.79 MG/ML-% IV SOLN
500.0000 mg | Freq: Three times a day (TID) | INTRAVENOUS | Status: DC
  Administered 2015-09-23 – 2015-09-24 (×2): 500 mg via INTRAVENOUS
  Filled 2015-09-23 (×4): qty 100

## 2015-09-23 MED ORDER — ALBUTEROL SULFATE (2.5 MG/3ML) 0.083% IN NEBU
INHALATION_SOLUTION | RESPIRATORY_TRACT | Status: AC
  Administered 2015-09-23: 2.5 mg
  Filled 2015-09-23: qty 3

## 2015-09-23 MED ORDER — ALBUTEROL (5 MG/ML) CONTINUOUS INHALATION SOLN
15.0000 mg/h | INHALATION_SOLUTION | Freq: Once | RESPIRATORY_TRACT | Status: DC
  Filled 2015-09-23: qty 20

## 2015-09-23 MED ORDER — ONDANSETRON HCL 4 MG/2ML IJ SOLN
4.0000 mg | Freq: Once | INTRAMUSCULAR | Status: AC
  Administered 2015-09-23: 4 mg via INTRAVENOUS
  Filled 2015-09-23: qty 2

## 2015-09-23 MED ORDER — ONDANSETRON HCL 4 MG PO TABS: 4.0000 mg | ORAL_TABLET | Freq: Four times a day (QID) | ORAL | Status: DC | PRN

## 2015-09-23 MED ORDER — ALBUTEROL SULFATE (2.5 MG/3ML) 0.083% IN NEBU
5.0000 mg | INHALATION_SOLUTION | Freq: Once | RESPIRATORY_TRACT | Status: AC
  Administered 2015-09-23 (×2): 5 mg via RESPIRATORY_TRACT

## 2015-09-23 MED ORDER — LACTATED RINGERS IV BOLUS (SEPSIS)
1000.0000 mL | Freq: Once | INTRAVENOUS | Status: AC
  Administered 2015-09-23: 1000 mL via INTRAVENOUS

## 2015-09-23 MED ORDER — ALBUTEROL SULFATE (2.5 MG/3ML) 0.083% IN NEBU
INHALATION_SOLUTION | RESPIRATORY_TRACT | Status: AC
  Administered 2015-09-23: 5 mg via RESPIRATORY_TRACT
  Filled 2015-09-23: qty 3

## 2015-09-23 MED ORDER — ESCITALOPRAM OXALATE 10 MG PO TABS
10.0000 mg | ORAL_TABLET | Freq: Every morning | ORAL | Status: DC
  Administered 2015-09-24 – 2015-09-27 (×4): 10 mg via ORAL
  Filled 2015-09-23 (×4): qty 1

## 2015-09-23 MED ORDER — DOXYCYCLINE HYCLATE 100 MG IV SOLR
100.0000 mg | Freq: Once | INTRAVENOUS | Status: AC
  Administered 2015-09-23: 100 mg via INTRAVENOUS
  Filled 2015-09-23: qty 100

## 2015-09-23 MED ORDER — POTASSIUM CHLORIDE 10 MEQ/100ML IV SOLN
10.0000 meq | INTRAVENOUS | Status: AC
  Administered 2015-09-23 – 2015-09-24 (×4): 10 meq via INTRAVENOUS
  Filled 2015-09-23 (×4): qty 100

## 2015-09-23 MED ORDER — PREDNISONE 20 MG PO TABS
60.0000 mg | ORAL_TABLET | Freq: Once | ORAL | Status: AC
  Administered 2015-09-23: 60 mg via ORAL
  Filled 2015-09-23: qty 3

## 2015-09-23 MED ORDER — ENOXAPARIN SODIUM 40 MG/0.4ML ~~LOC~~ SOLN
40.0000 mg | Freq: Every day | SUBCUTANEOUS | Status: DC
  Administered 2015-09-24 – 2015-09-27 (×4): 40 mg via SUBCUTANEOUS
  Filled 2015-09-23 (×4): qty 0.4

## 2015-09-23 MED ORDER — ONDANSETRON HCL 4 MG/2ML IJ SOLN: 4.0000 mg | Freq: Four times a day (QID) | INTRAMUSCULAR | Status: DC | PRN

## 2015-09-23 MED ORDER — DEXTROSE 5 % IV SOLN
2.0000 g | Freq: Three times a day (TID) | INTRAVENOUS | Status: DC
  Administered 2015-09-23: 2 g via INTRAVENOUS
  Filled 2015-09-23: qty 2

## 2015-09-23 MED ORDER — VANCOMYCIN HCL 10 G IV SOLR
1500.0000 mg | Freq: Once | INTRAVENOUS | Status: AC
  Administered 2015-09-23: 1500 mg via INTRAVENOUS
  Filled 2015-09-23: qty 1500

## 2015-09-23 NOTE — H&P (Signed)
History and Physical    Kathy Flores [id number] DOB: 1990/07/14 DOA: 09/23/2015  PCP: No PCP Per Patient  Patient coming from: Home.  Chief Complaint: Shortness of breath.  HPI: Kathy Flores is a 25 y.o. female with asthma presents to the ER because of worsening shortness of breath over the last 24 hours. Patient started having upper respiratory tract infection symptoms yesterday and gradually started having productive cough with wheezing and shortness of breath. Denies any chest pain. In addition patient was complaining of lower abdominal crampy pain. Denies any vaginal discharge. CT angiogram of the chest and abdomen and pelvis only shows possible pneumonitis. Pelvic exam was done and was positive for trichomonas. Patient's lactic acid was 8 and critical care was consulted. Per Pulmonary critical care consult patient's lactate was elevated due to shortness of breath and nebulizer use and patient can be admitted to stepdown unit. On my exam patient is not in distress was still wheezing. Denies any chest pain.   ED Course: Patient was given antibiotics nebulizer treatment and steroids for asthma. Patient was given fluid bolus for the elevated lactate. Lactate level was showing a decreasing trend.  Review of Systems: As per HPI, rest all negative.   Past Medical History:  Diagnosis Date  . Asthma    inhaler used 3x month  . Depression   . Fibroid   . Trichomonas infection   . Vaginal Pap smear, abnormal     Past Surgical History:  Procedure Laterality Date  . CESAREAN SECTION N/A 11/23/2013   Procedure: CESAREAN SECTION;  Surgeon: Woodroe Mode, MD;  Location: [facility];  Service: Obstetrics;  Laterality: N/A;  . NO PAST SURGERIES       reports that she has quit smoking. She has never used smokeless tobacco. She reports that she does not drink alcohol or use drugs.  Allergies  Allergen Reactions  . Penicillins Hives and Rash    Has patient had a PCN reaction  causing immediate rash, facial/tongue/throat swelling, SOB or lightheadedness with hypotension: Yes Has patient had a PCN reaction causing severe rash involving mucus membranes or skin necrosis: No Has patient had a PCN reaction that required hospitalization No Has patient had a PCN reaction occurring within the last 10 years: Yes If all of the above answers are "NO", then may proceed with Cephalosporin use.     Family History  Problem Relation Age of Onset  . Asthma Sister     Prior to Admission medications   Medication Sig Start Date End Date Taking? Authorizing Provider  albuterol (PROVENTIL) (5 MG/ML) 0.5% nebulizer solution Take 0.5 mLs (2.5 mg total) by nebulization every 6 (six) hours as needed for wheezing or shortness of breath. 08/20/15  Yes Larene Pickett, PA-C  budesonide (PULMICORT) 180 MCG/ACT inhaler Inhale 1 puff into the lungs 2 (two) times daily. 08/20/15  Yes Larene Pickett, PA-C  escitalopram (LEXAPRO) 10 MG tablet Take 10 mg by mouth every morning.   Yes Historical Provider, MD    Physical Exam: Vitals:   09/23/15 1900 09/23/15 1930 09/23/15 2000 09/23/15 2015  BP: 134/60 124/75 112/83 118/73  Pulse: 100 111 106 104  Resp: 23 23 26 20   Temp:      TempSrc:      SpO2: 100% 99% 100% 97%  Weight:      Height:          Constitutional: Not in distress. Able to complete sentences. Vitals:   09/23/15 1900 09/23/15 1930  09/23/15 2000 09/23/15 2015  BP: 134/60 124/75 112/83 118/73  Pulse: 100 111 106 104  Resp: 23 23 26 20   Temp:      TempSrc:      SpO2: 100% 99% 100% 97%  Weight:      Height:       Eyes: Anicteric no pallor. ENMT: No discharge from the ears eyes nose or mouth. Neck: No mass felt. No JVD appreciated. Respiratory: Bilateral expiratory wheeze heard no crepitations present. Cardiovascular: S1-S2 heard. Abdomen: Soft nontender bowel sounds present. Musculoskeletal: No edema. Skin: No rash. Neurologic: Alert awake oriented to time place and  person. Moves all extremities. Psychiatric: Appears normal.   Labs on Admission: I have personally reviewed following labs and imaging studies  CBC:  Recent Labs Lab 09/23/15 1214 09/23/15 1233  WBC 14.6*  --   NEUTROABS 13.3*  --   HGB 9.6* 10.9*  HCT 31.9* 32.0*  MCV 83.1  --   PLT 245  --    Basic Metabolic Panel:  Recent Labs Lab 09/23/15 1214 09/23/15 1233  NA 134* 137  K 3.1* 2.9*  CL 103 100*  CO2 19*  --   GLUCOSE 195* 205*  BUN 6 5*  CREATININE 1.02* 0.90  CALCIUM 8.8*  --    GFR: Estimated Creatinine Clearance: 98.1 mL/min (by C-G formula based on SCr of 0.9 mg/dL). Liver Function Tests: No results for input(s): AST, ALT, ALKPHOS, BILITOT, PROT, ALBUMIN in the last 168 hours. No results for input(s): LIPASE, AMYLASE in the last 168 hours. No results for input(s): AMMONIA in the last 168 hours. Coagulation Profile: No results for input(s): INR, PROTIME in the last 168 hours. Cardiac Enzymes: No results for input(s): CKTOTAL, CKMB, CKMBINDEX, TROPONINI in the last 168 hours. BNP (last 3 results) No results for input(s): PROBNP in the last 8760 hours. HbA1C: No results for input(s): HGBA1C in the last 72 hours. CBG:  Recent Labs Lab 09/23/15 1249  GLUCAP 207*   Lipid Profile: No results for input(s): CHOL, HDL, LDLCALC, TRIG, CHOLHDL, LDLDIRECT in the last 72 hours. Thyroid Function Tests: No results for input(s): TSH, T4TOTAL, FREET4, T3FREE, THYROIDAB in the last 72 hours. Anemia Panel: No results for input(s): VITAMINB12, FOLATE, FERRITIN, TIBC, IRON, RETICCTPCT in the last 72 hours. Urine analysis:    Component Value Date/Time   COLORURINE YELLOW 09/23/2015 1316   APPEARANCEUR CLOUDY (A) 09/23/2015 1316   LABSPEC 1.002 (L) 09/23/2015 1316   PHURINE 5.0 09/23/2015 1316   GLUCOSEU 100 (A) 09/23/2015 1316   HGBUR NEGATIVE 09/23/2015 1316   BILIRUBINUR NEGATIVE 09/23/2015 1316   KETONESUR NEGATIVE 09/23/2015 1316   PROTEINUR NEGATIVE  09/23/2015 1316   UROBILINOGEN 0.2 08/04/2015 1728   NITRITE NEGATIVE 09/23/2015 1316   LEUKOCYTESUR LARGE (A) 09/23/2015 1316   Sepsis Labs: @LABRCNTIP (procalcitonin:4,lacticidven:4) ) Recent Results (from the past 240 hour(s))  Wet prep, genital     Status: Abnormal   Collection Time: 09/23/15  3:45 PM  Result Value Ref Range Status   Yeast Wet Prep HPF POC NONE SEEN NONE SEEN Final   Trich, Wet Prep PRESENT (A) NONE SEEN Final   Clue Cells Wet Prep HPF POC NONE SEEN NONE SEEN Final   WBC, Wet Prep HPF POC MANY (A) NONE SEEN Final   Sperm NONE SEEN  Final     Radiological Exams on Admission: Dg Chest 2 View  Result Date: 09/23/2015 CLINICAL DATA:  Patient with cough and shortness of breath. Mid sternal chest pain. EXAM: CHEST  2 VIEW COMPARISON:  Chest radiograph 08/20/2015 FINDINGS: Normal cardiac and mediastinal contours. No consolidative pulmonary opacities. No pleural effusion or pneumothorax. Regional skeleton is unremarkable. IMPRESSION: No active cardiopulmonary disease. Electronically Signed   By: Lovey Newcomer M.D.   On: 09/23/2015 09:12   Ct Angio Chest Pe W And/or Wo Contrast  Result Date: 09/23/2015 CLINICAL DATA:  25 year old female with severe sepsis, shortness of breath and chest pain. EXAM: CT ANGIOGRAPHY CHEST CT ABDOMEN AND PELVIS WITH CONTRAST TECHNIQUE: Multidetector CT imaging of the chest was performed using the standard protocol during bolus administration of intravenous contrast. Multiplanar CT image reconstructions and MIPs were obtained to evaluate the vascular anatomy. Multidetector CT imaging of the abdomen and pelvis was performed using the standard protocol during bolus administration of intravenous contrast. CONTRAST:  100 cc Isovue 370 IV. COMPARISON:  None. FINDINGS: CTA CHEST FINDINGS Cardiovascular: The study is moderate quality for the evaluation of pulmonary embolism, with limited evaluation of the subsegmental pulmonary arteries due to motion and  limited contrast opacification. There are no filling defects in the central, lobar or segmental pulmonary artery branches to suggest acute pulmonary embolism. Normal course and caliber of the thoracic aorta. Top-normal caliber main pulmonary artery (3.2 cm diameter). Normal heart size. No significant pericardial fluid/thickening. Mediastinum/Nodes: No discrete thyroid nodules. Unremarkable esophagus. No pathologically enlarged axillary, mediastinal or hilar lymph nodes. Lungs/Pleura: No pneumothorax. No pleural effusion. No acute consolidative airspace disease, lung masses or significant pulmonary nodules. There are mild patchy ground-glass opacities in the upper lungs, most prominent in the right upper lobe. Musculoskeletal:  No aggressive appearing focal osseous lesions. Review of the MIP images confirms the above findings. CT ABDOMEN and PELVIS FINDINGS Motion degraded study . Hepatobiliary: Normal liver with no liver mass. Normal gallbladder with no radiopaque cholelithiasis. No biliary ductal dilatation. Pancreas: Normal, with no mass or duct dilation. Spleen: Normal size. No mass. Adrenals/Urinary Tract: Normal adrenals. Normal kidneys with no hydronephrosis and no renal mass. Normal bladder. Stomach/Bowel: Grossly normal stomach. Normal caliber small bowel with no small bowel wall thickening. Normal appendix . Normal large bowel with no diverticulosis, large bowel wall thickening or pericolonic fat stranding. Vascular/Lymphatic: Normal caliber abdominal aorta. Patent portal, splenic, hepatic and renal veins. No pathologically enlarged lymph nodes in the abdomen or pelvis. Reproductive: Grossly normal uterus.  No adnexal mass. Other: No pneumoperitoneum, ascites or focal fluid collection. Musculoskeletal: No aggressive appearing focal osseous lesions. Review of the MIP images confirms the above findings. IMPRESSION: 1. No evidence of pulmonary embolism, with limitations as described . 2. Mild patchy  ground-glass opacities in the upper lungs, with a broad differential including atypical infection, vasculitis or other inflammatory etiologies. 3. No acute abnormality in the abdomen or pelvis. No evidence of bowel obstruction or acute bowel inflammation. Normal appendix. Electronically Signed   By: Ilona Sorrel M.D.   On: 09/23/2015 18:08   Ct Abdomen Pelvis W Contrast  Result Date: 09/23/2015 CLINICAL DATA:  25 year old female with severe sepsis, shortness of breath and chest pain. EXAM: CT ANGIOGRAPHY CHEST CT ABDOMEN AND PELVIS WITH CONTRAST TECHNIQUE: Multidetector CT imaging of the chest was performed using the standard protocol during bolus administration of intravenous contrast. Multiplanar CT image reconstructions and MIPs were obtained to evaluate the vascular anatomy. Multidetector CT imaging of the abdomen and pelvis was performed using the standard protocol during bolus administration of intravenous contrast. CONTRAST:  100 cc Isovue 370 IV. COMPARISON:  None. FINDINGS: CTA CHEST FINDINGS Cardiovascular: The study is moderate quality  for the evaluation of pulmonary embolism, with limited evaluation of the subsegmental pulmonary arteries due to motion and limited contrast opacification. There are no filling defects in the central, lobar or segmental pulmonary artery branches to suggest acute pulmonary embolism. Normal course and caliber of the thoracic aorta. Top-normal caliber main pulmonary artery (3.2 cm diameter). Normal heart size. No significant pericardial fluid/thickening. Mediastinum/Nodes: No discrete thyroid nodules. Unremarkable esophagus. No pathologically enlarged axillary, mediastinal or hilar lymph nodes. Lungs/Pleura: No pneumothorax. No pleural effusion. No acute consolidative airspace disease, lung masses or significant pulmonary nodules. There are mild patchy ground-glass opacities in the upper lungs, most prominent in the right upper lobe. Musculoskeletal:  No aggressive  appearing focal osseous lesions. Review of the MIP images confirms the above findings. CT ABDOMEN and PELVIS FINDINGS Motion degraded study . Hepatobiliary: Normal liver with no liver mass. Normal gallbladder with no radiopaque cholelithiasis. No biliary ductal dilatation. Pancreas: Normal, with no mass or duct dilation. Spleen: Normal size. No mass. Adrenals/Urinary Tract: Normal adrenals. Normal kidneys with no hydronephrosis and no renal mass. Normal bladder. Stomach/Bowel: Grossly normal stomach. Normal caliber small bowel with no small bowel wall thickening. Normal appendix . Normal large bowel with no diverticulosis, large bowel wall thickening or pericolonic fat stranding. Vascular/Lymphatic: Normal caliber abdominal aorta. Patent portal, splenic, hepatic and renal veins. No pathologically enlarged lymph nodes in the abdomen or pelvis. Reproductive: Grossly normal uterus.  No adnexal mass. Other: No pneumoperitoneum, ascites or focal fluid collection. Musculoskeletal: No aggressive appearing focal osseous lesions. Review of the MIP images confirms the above findings. IMPRESSION: 1. No evidence of pulmonary embolism, with limitations as described . 2. Mild patchy ground-glass opacities in the upper lungs, with a broad differential including atypical infection, vasculitis or other inflammatory etiologies. 3. No acute abnormality in the abdomen or pelvis. No evidence of bowel obstruction or acute bowel inflammation. Normal appendix. Electronically Signed   By: Ilona Sorrel M.D.   On: 09/23/2015 18:08    EKG: Independently reviewed. Sinus tachycardia.  Assessment/Plan Principal Problem:   Acute respiratory failure with hypoxemia (HCC) Active Problems:   SIRS (systemic inflammatory response syndrome) (HCC)   Normochromic normocytic anemia    1. Acute respiratory failure with hypoxemia secondary to asthma exacerbation - patient is continued on Solu-Medrol nebulizer and Pulmicort. I will also place  patient on Levaquin for now. Follow lactate levels and blood cultures. Check respiratory viral panel. 2. SIRS/Metabolic acidosis with elevated lactate - appreciate pulmonary critical care consult. Lactate elevated due to nebulizer use and shortness of breath as per pulmonary critical care. Continue with hydration and closely follow lactate and metabolic panel. 3. Trichomonas - patient is on Flagyl. Advised treatment for partner. 4. Normocytic normochromic anemia - follow CBC. Check anemia panel.   DVT prophylaxis: Lovenox. Code Status: Full code.  Family Communication: Discussed with patient.  Disposition Plan: Home.  Consults called: Pulmonary critical care.  Admission status: Inpatient. Likely stay 2-3 days.    Rise Patience MD [facility] Pager [phone].  If 7PM-7AM, please contact night-coverage [email or web address] Password [facility]  09/23/2015, 8:44 PM

## 2015-09-23 NOTE — ED Notes (Signed)
Pt. Had BP of 74/30 when done manually. Informed RN and Dr. Myrene Buddy.

## 2015-09-23 NOTE — ED Notes (Signed)
Pt ambulated to bedside commode to urinate

## 2015-09-23 NOTE — ED Notes (Signed)
Dr. Sande Brothers notified of pt BP

## 2015-09-23 NOTE — ED Notes (Signed)
The levophed has been discontinued per hospitalist Nichola Sizer and cc [facility]

## 2015-09-23 NOTE — ED Notes (Signed)
Per Mauri Brooklyn MD intensivist, ordered I stated ABG and lactic acid and hold on Levophed

## 2015-09-23 NOTE — ED Notes (Signed)
Pt was given a cup of ice chips and crackers, per Fabio Asa, RN.

## 2015-09-23 NOTE — ED Provider Notes (Addendum)
[facility] DEPT Provider Note   CSN: [id number] Arrival date & time: 09/23/15  [id number]     History   Chief Complaint Chief Complaint  Patient presents with  . Asthma    HPI BURNADETTE SHIMA is a 25 y.o. female.  HPI 25 year old female with past medical history of asthma, depression, and history of PID who presents with a several day history of cough congestion and rhinorrhea. Patient states that she has known recent sick contacts with cough, congestion and flulike symptoms over the last several days. She states that starting 2 days ago she developed nasal congestion, sore throat and then cough over the last 24 hours she has had significant wheezing and is out of albuterol. She subsequently presents for evaluation. She has had some mild nausea but denies any other complaints at this time. She does complain of moderate chest pain that she describes as a sharp, aching, bilateral chest wall pain after coughing. No history of coronary disease.  Past Medical History:  Diagnosis Date  . Asthma    inhaler used 3x month  . Depression   . Fibroid   . Trichomonas infection   . Vaginal Pap smear, abnormal     Patient Active Problem List   Diagnosis Date Noted  . Encounter for observation of infant for suspected infection   . Post term pregnancy, 41 weeks 11/22/2013  . Post-term pregnancy, 40-42 weeks of gestation   . [redacted] weeks gestation of pregnancy     Past Surgical History:  Procedure Laterality Date  . CESAREAN SECTION N/A 11/23/2013   Procedure: CESAREAN SECTION;  Surgeon: Woodroe Mode, MD;  Location: [facility];  Service: Obstetrics;  Laterality: N/A;  . NO PAST SURGERIES      OB History    Gravida Para Term Preterm AB Living   3 3 3     3    SAB TAB Ectopic Multiple Live Births         0 3       Home Medications    Prior to Admission medications   Medication Sig Start Date End Date Taking? Authorizing Provider  albuterol (PROVENTIL) (5 MG/ML) 0.5% nebulizer solution  Take 0.5 mLs (2.5 mg total) by nebulization every 6 (six) hours as needed for wheezing or shortness of breath. 08/20/15  Yes Larene Pickett, PA-C  budesonide (PULMICORT) 180 MCG/ACT inhaler Inhale 1 puff into the lungs 2 (two) times daily. 08/20/15  Yes Larene Pickett, PA-C  escitalopram (LEXAPRO) 10 MG tablet Take 10 mg by mouth every morning.   Yes Historical Provider, MD    Family History No family history on file.  Social History Social History  Substance Use Topics  . Smoking status: Former Research scientist (life sciences)  . Smokeless tobacco: Never Used  . Alcohol use No     Allergies   Penicillins   Review of Systems Review of Systems  Constitutional: Negative for chills and fever.  HENT: Positive for congestion, rhinorrhea and sore throat.   Eyes: Negative for visual disturbance.  Respiratory: Positive for cough and shortness of breath. Negative for wheezing.   Cardiovascular: Negative for chest pain and leg swelling.  Gastrointestinal: Negative for abdominal pain, diarrhea, nausea and vomiting.  Genitourinary: Negative for dysuria, flank pain, vaginal bleeding and vaginal discharge.  Musculoskeletal: Negative for neck pain.  Skin: Negative for rash.  Allergic/Immunologic: Negative for immunocompromised state.  Neurological: Negative for syncope and headaches.  Hematological: Does not bruise/bleed easily.  All other systems reviewed and are negative.  Physical Exam Updated Vital Signs BP 134/60   Pulse 100   Temp 99.1 F (37.3 C) (Oral)   Resp 23   Ht 5\' 1"  (1.549 m)   Wt 200 lb (90.7 kg)   SpO2 100%   BMI 37.79 kg/m   Physical Exam  Constitutional: She is oriented to person, place, and time. She appears well-developed and well-nourished. She appears distressed.  HENT:  Head: Normocephalic and atraumatic.  Eyes: Conjunctivae are normal.  Neck: Neck supple.  Cardiovascular: Regular rhythm and normal heart sounds.  Tachycardia present.  Exam reveals no friction rub.   No  murmur heard. Pulmonary/Chest: Tachypnea noted. She is in respiratory distress. She has wheezes. She has no rales.  Abdominal: She exhibits no distension.  Genitourinary: Pelvic exam was performed with patient supine. There is no rash or lesion on the right labia. There is no rash or lesion on the left labia. Uterus is tender. Cervix exhibits motion tenderness and discharge. Cervix exhibits no friability. Right adnexum displays no mass and no fullness. Left adnexum displays no mass and no fullness. There is erythema and tenderness in the vagina. No signs of injury around the vagina. Vaginal discharge found.  Musculoskeletal: She exhibits no edema.  Neurological: She is alert and oriented to person, place, and time. She exhibits normal muscle tone.  Skin: Skin is warm. Capillary refill takes less than 2 seconds.  Psychiatric: She has a normal mood and affect.  Nursing note and vitals reviewed.    ED Treatments / Results  Labs (all labs ordered are listed, but only abnormal results are displayed) Labs Reviewed  WET PREP, GENITAL - Abnormal; Notable for the following:       Result Value   Trich, Wet Prep PRESENT (*)    WBC, Wet Prep HPF POC MANY (*)    All other components within normal limits  CBC WITH DIFFERENTIAL/PLATELET - Abnormal; Notable for the following:    WBC 14.6 (*)    RBC 3.84 (*)    Hemoglobin 9.6 (*)    HCT 31.9 (*)    MCH 25.0 (*)    Neutro Abs 13.3 (*)    All other components within normal limits  BASIC METABOLIC PANEL - Abnormal; Notable for the following:    Sodium 134 (*)    Potassium 3.1 (*)    CO2 19 (*)    Glucose, Bld 195 (*)    Creatinine, Ser 1.02 (*)    Calcium 8.8 (*)    All other components within normal limits  URINALYSIS, ROUTINE W REFLEX MICROSCOPIC (NOT AT [facility]) - Abnormal; Notable for the following:    APPearance CLOUDY (*)    Specific Gravity, Urine 1.002 (*)    Glucose, UA 100 (*)    Leukocytes, UA LARGE (*)    All other components within  normal limits  URINE MICROSCOPIC-ADD ON - Abnormal; Notable for the following:    Squamous Epithelial / LPF 0-5 (*)    Bacteria, UA RARE (*)    All other components within normal limits  I-STAT CHEM 8, ED - Abnormal; Notable for the following:    Potassium 2.9 (*)    Chloride 100 (*)    BUN 5 (*)    Glucose, Bld 205 (*)    Calcium, Ion 1.12 (*)    Hemoglobin 10.9 (*)    HCT 32.0 (*)    All other components within normal limits  CBG MONITORING, ED - Abnormal; Notable for the following:    Glucose-Capillary 207 (*)  All other components within normal limits  I-STAT CG4 LACTIC ACID, ED - Abnormal; Notable for the following:    Lactic Acid, Venous 8.13 (*)    All other components within normal limits  I-STAT CG4 LACTIC ACID, ED - Abnormal; Notable for the following:    Lactic Acid, Venous 6.71 (*)    All other components within normal limits  I-STAT ARTERIAL BLOOD GAS, ED - Abnormal; Notable for the following:    pH, Arterial 7.262 (*)    pCO2 arterial 25.4 (*)    Bicarbonate 11.4 (*)    Acid-base deficit 14.0 (*)    All other components within normal limits  CULTURE, BLOOD (ROUTINE X 2)  CULTURE, BLOOD (ROUTINE X 2)  URINE CULTURE  BLOOD GAS, VENOUS  LACTIC ACID, PLASMA  I-STAT TROPOININ, ED  I-STAT BETA HCG BLOOD, ED ([facility], [facility], [facility] ONLY)  I-STAT TROPOININ, ED  I-STAT CG4 LACTIC ACID, ED  I-STAT CG4 LACTIC ACID, ED  GC/CHLAMYDIA PROBE AMP ([place]) NOT AT [facility]    EKG  EKG Interpretation  Date/Time:  Sunday September 23 2015 08:30:49 EDT Ventricular Rate:  99 PR Interval:  144 QRS Duration: 76 QT Interval:  346 QTC Calculation: 444 R Axis:   77 Text Interpretation:  Normal sinus rhythm Normal ECG Baseline wander No significant change since last tracing Confirmed by Neiva Maenza MD, Lysbeth Galas [phone]) on 09/23/2015 9:22:59 AM       Radiology Dg Chest 2 View  Result Date: 09/23/2015 CLINICAL DATA:  Patient with cough and shortness of breath. Mid sternal chest pain.  EXAM: CHEST  2 VIEW COMPARISON:  Chest radiograph 08/20/2015 FINDINGS: Normal cardiac and mediastinal contours. No consolidative pulmonary opacities. No pleural effusion or pneumothorax. Regional skeleton is unremarkable. IMPRESSION: No active cardiopulmonary disease. Electronically Signed   By: Lovey Newcomer M.D.   On: 09/23/2015 09:12   Ct Angio Chest Pe W And/or Wo Contrast  Result Date: 09/23/2015 CLINICAL DATA:  25 year old female with severe sepsis, shortness of breath and chest pain. EXAM: CT ANGIOGRAPHY CHEST CT ABDOMEN AND PELVIS WITH CONTRAST TECHNIQUE: Multidetector CT imaging of the chest was performed using the standard protocol during bolus administration of intravenous contrast. Multiplanar CT image reconstructions and MIPs were obtained to evaluate the vascular anatomy. Multidetector CT imaging of the abdomen and pelvis was performed using the standard protocol during bolus administration of intravenous contrast. CONTRAST:  100 cc Isovue 370 IV. COMPARISON:  None. FINDINGS: CTA CHEST FINDINGS Cardiovascular: The study is moderate quality for the evaluation of pulmonary embolism, with limited evaluation of the subsegmental pulmonary arteries due to motion and limited contrast opacification. There are no filling defects in the central, lobar or segmental pulmonary artery branches to suggest acute pulmonary embolism. Normal course and caliber of the thoracic aorta. Top-normal caliber main pulmonary artery (3.2 cm diameter). Normal heart size. No significant pericardial fluid/thickening. Mediastinum/Nodes: No discrete thyroid nodules. Unremarkable esophagus. No pathologically enlarged axillary, mediastinal or hilar lymph nodes. Lungs/Pleura: No pneumothorax. No pleural effusion. No acute consolidative airspace disease, lung masses or significant pulmonary nodules. There are mild patchy ground-glass opacities in the upper lungs, most prominent in the right upper lobe. Musculoskeletal:  No aggressive  appearing focal osseous lesions. Review of the MIP images confirms the above findings. CT ABDOMEN and PELVIS FINDINGS Motion degraded study . Hepatobiliary: Normal liver with no liver mass. Normal gallbladder with no radiopaque cholelithiasis. No biliary ductal dilatation. Pancreas: Normal, with no mass or duct dilation. Spleen: Normal size. No mass. Adrenals/Urinary Tract: Normal  adrenals. Normal kidneys with no hydronephrosis and no renal mass. Normal bladder. Stomach/Bowel: Grossly normal stomach. Normal caliber small bowel with no small bowel wall thickening. Normal appendix . Normal large bowel with no diverticulosis, large bowel wall thickening or pericolonic fat stranding. Vascular/Lymphatic: Normal caliber abdominal aorta. Patent portal, splenic, hepatic and renal veins. No pathologically enlarged lymph nodes in the abdomen or pelvis. Reproductive: Grossly normal uterus.  No adnexal mass. Other: No pneumoperitoneum, ascites or focal fluid collection. Musculoskeletal: No aggressive appearing focal osseous lesions. Review of the MIP images confirms the above findings. IMPRESSION: 1. No evidence of pulmonary embolism, with limitations as described . 2. Mild patchy ground-glass opacities in the upper lungs, with a broad differential including atypical infection, vasculitis or other inflammatory etiologies. 3. No acute abnormality in the abdomen or pelvis. No evidence of bowel obstruction or acute bowel inflammation. Normal appendix. Electronically Signed   By: Ilona Sorrel M.D.   On: 09/23/2015 18:08   Ct Abdomen Pelvis W Contrast  Result Date: 09/23/2015 CLINICAL DATA:  25 year old female with severe sepsis, shortness of breath and chest pain. EXAM: CT ANGIOGRAPHY CHEST CT ABDOMEN AND PELVIS WITH CONTRAST TECHNIQUE: Multidetector CT imaging of the chest was performed using the standard protocol during bolus administration of intravenous contrast. Multiplanar CT image reconstructions and MIPs were obtained  to evaluate the vascular anatomy. Multidetector CT imaging of the abdomen and pelvis was performed using the standard protocol during bolus administration of intravenous contrast. CONTRAST:  100 cc Isovue 370 IV. COMPARISON:  None. FINDINGS: CTA CHEST FINDINGS Cardiovascular: The study is moderate quality for the evaluation of pulmonary embolism, with limited evaluation of the subsegmental pulmonary arteries due to motion and limited contrast opacification. There are no filling defects in the central, lobar or segmental pulmonary artery branches to suggest acute pulmonary embolism. Normal course and caliber of the thoracic aorta. Top-normal caliber main pulmonary artery (3.2 cm diameter). Normal heart size. No significant pericardial fluid/thickening. Mediastinum/Nodes: No discrete thyroid nodules. Unremarkable esophagus. No pathologically enlarged axillary, mediastinal or hilar lymph nodes. Lungs/Pleura: No pneumothorax. No pleural effusion. No acute consolidative airspace disease, lung masses or significant pulmonary nodules. There are mild patchy ground-glass opacities in the upper lungs, most prominent in the right upper lobe. Musculoskeletal:  No aggressive appearing focal osseous lesions. Review of the MIP images confirms the above findings. CT ABDOMEN and PELVIS FINDINGS Motion degraded study . Hepatobiliary: Normal liver with no liver mass. Normal gallbladder with no radiopaque cholelithiasis. No biliary ductal dilatation. Pancreas: Normal, with no mass or duct dilation. Spleen: Normal size. No mass. Adrenals/Urinary Tract: Normal adrenals. Normal kidneys with no hydronephrosis and no renal mass. Normal bladder. Stomach/Bowel: Grossly normal stomach. Normal caliber small bowel with no small bowel wall thickening. Normal appendix . Normal large bowel with no diverticulosis, large bowel wall thickening or pericolonic fat stranding. Vascular/Lymphatic: Normal caliber abdominal aorta. Patent portal, splenic,  hepatic and renal veins. No pathologically enlarged lymph nodes in the abdomen or pelvis. Reproductive: Grossly normal uterus.  No adnexal mass. Other: No pneumoperitoneum, ascites or focal fluid collection. Musculoskeletal: No aggressive appearing focal osseous lesions. Review of the MIP images confirms the above findings. IMPRESSION: 1. No evidence of pulmonary embolism, with limitations as described . 2. Mild patchy ground-glass opacities in the upper lungs, with a broad differential including atypical infection, vasculitis or other inflammatory etiologies. 3. No acute abnormality in the abdomen or pelvis. No evidence of bowel obstruction or acute bowel inflammation. Normal appendix. Electronically Signed   By:  Ilona Sorrel M.D.   On: 09/23/2015 18:08    Procedures .Critical Care Performed by: Duffy Bruce Authorized by: Duffy Bruce   Critical care provider statement:    Critical care time (minutes):  75   Critical care time was exclusive of:  Separately billable procedures and treating other patients   Critical care was necessary to treat or prevent imminent or life-threatening deterioration of the following conditions:  Circulatory failure, respiratory failure, shock and dehydration   Critical care was time spent personally by me on the following activities:  Ordering and performing treatments and interventions, ordering and review of laboratory studies, ordering and review of radiographic studies, pulse oximetry, re-evaluation of patient's condition, blood draw for specimens, development of treatment plan with patient or surrogate, discussions with consultants, evaluation of patient's response to treatment, examination of patient and obtaining history from patient or surrogate   I assumed direction of critical care for this patient from another provider in my specialty: no      (including critical care time)  Medications Ordered in ED Medications  albuterol (PROVENTIL,VENTOLIN)  solution continuous neb ( Nebulization Canceled Entry 09/23/15 1051)  ceFEPIme (MAXIPIME) 2 g in dextrose 5 % 50 mL IVPB (0 g Intravenous Stopped 09/23/15 1505)  norepinephrine (LEVOPHED) 4 mg in dextrose 5 % 250 mL (0.016 mg/mL) infusion (not administered)  doxycycline (VIBRAMYCIN) 100 mg in dextrose 5 % 250 mL IVPB (not administered)  potassium chloride SA (K-DUR,KLOR-CON) CR tablet 60 mEq (not administered)  ipratropium-albuterol (DUONEB) 0.5-2.5 (3) MG/3ML nebulizer solution 3 mL (not administered)  predniSONE (DELTASONE) tablet 60 mg (not administered)  albuterol (PROVENTIL) (2.5 MG/3ML) 0.083% nebulizer solution 5 mg (5 mg Nebulization Given 09/23/15 0845)  albuterol (PROVENTIL) (2.5 MG/3ML) 0.083% nebulizer solution 5 mg (5 mg Nebulization Given 09/23/15 0937)  ipratropium (ATROVENT) nebulizer solution 0.5 mg (0.5 mg Nebulization Given 09/23/15 0937)  predniSONE (DELTASONE) tablet 60 mg (60 mg Oral Given 09/23/15 0936)  HYDROcodone-acetaminophen (HYCET) 7.5-325 mg/15 ml solution 10 mL (10 mLs Oral Given 09/23/15 1018)  albuterol (PROVENTIL,VENTOLIN) solution continuous neb (10 mg/hr Nebulization Given 09/23/15 1050)  ipratropium (ATROVENT) nebulizer solution 1 mg (1 mg Nebulization Given 09/23/15 1050)  sodium chloride 0.9 % bolus 1,000 mL (0 mLs Intravenous Stopped 09/23/15 1406)  magnesium sulfate IVPB 2 g 50 mL (0 g Intravenous Stopped 09/23/15 1357)  sodium chloride 0.9 % bolus 1,000 mL (0 mLs Intravenous Stopped 09/23/15 1505)    And  sodium chloride 0.9 % bolus 1,000 mL (0 mLs Intravenous Stopped 09/23/15 1505)  metroNIDAZOLE (FLAGYL) IVPB 500 mg (0 mg Intravenous Stopped 09/23/15 1505)  vancomycin (VANCOCIN) 1,500 mg in sodium chloride 0.9 % 500 mL IVPB (0 mg Intravenous Stopped 09/23/15 1808)  ondansetron (ZOFRAN) injection 4 mg (4 mg Intravenous Given 09/23/15 1527)  lactated ringers bolus 1,000 mL (0 mLs Intravenous Stopped 09/23/15 1647)  ketorolac (TORADOL) 30 MG/ML injection 15 mg (15 mg  Intravenous Given 09/23/15 1526)  iopamidol (ISOVUE-370) 76 % injection 100 mL (100 mLs Intravenous Contrast Given 09/23/15 1707)     Initial Impression / Assessment and Plan / ED Course  I have reviewed the triage vital signs and the nursing notes.  Pertinent labs & imaging results that were available during my care of the patient were reviewed by me and considered in my medical decision making (see chart for details).  Clinical Course    25 year old female with past medical history of asthma who presents with a several-day history of cough congestion and URI. On arrival, patient  has diffuse wheezing and increased work of breathing. She has mild rhinorrhea and nasal congestion. Abdomen is soft. Suspect viral URI with subsequent asthma exacerbation. Initial breathing treatment given and patient had increased wheezing consistent with severe bronchospasm. Will subsequently place on continuous and give prednisone. Otherwise, she has had no fevers and she has no tachycardia hypotension or other signs of systemic illness at this time. Will monitor response and determine further disposition. Chest x-ray obtained in triage is clear  While receiving continuous, patient noted to become acutely hypotensive and tachycardic. She now complains of abdominal pain. Of note, she states that she forgot to mention that she has had several days of general fatigue, lightheadedness and vaginal discharge and odor. Pelvic exam does show cervical motion tenderness and copious vaginal discharge. She remains hypotensive. Will start sepsis protocol. She is allergic to penicillins, so will give cefepime initially for sepsis of unknown etiology. At this time, differential includes occult pneumonia, PID, or other intra-abdominal source and she now does complain of intermittent, diffuse abdominal pain. Will continue aggressive fluid resuscitation and antibiotics.  Patient is positive for Trichomonas. She has been given IV Flagyl.  Otherwise, she remains hypotensive despite aggressive fluid resuscitation. Her initial lactate was 8, which was likely secondary to albuterol but it has remained elevated despite fluids. If her persistent tachycardia and hypotension, concern for ongoing sepsis. Must also consider alternative etiologies such as PE, given her chest pain and shortness of breath. Will obtain CT of the chest as well as abdomen and pelvis. Will consider starting pressors given persistent lactic acidosis and adequate fluid resuscitation. Patient is in agreement with this plan.  Consulted with ICU who will evaluate. CT obtained - no significant surgical abnormalities on my preliminary review. Will follow-up intensivist recommendations, dispo accordingly. IVF running. ABX given.  Final Clinical Impressions(s) / ED Diagnoses   Final diagnoses:  Severe sepsis (HCC)  Asthma exacerbation  Metabolic acidosis  Lactic acidosis  Chest pain, unspecified chest pain type  Sinus tachycardia ([place])  Tachypnea  Trichomoniasis  PID (acute pelvic inflammatory disease)      Duffy Bruce, MD 09/23/15 1947    Duffy Bruce, MD 09/23/15 1948

## 2015-09-23 NOTE — ED Notes (Signed)
Dr. Tamala Julian notified of ABG and lactic acid. Will continue to monitor

## 2015-09-23 NOTE — ED Notes (Signed)
Pt at xray, will bring to B14 after xray

## 2015-09-23 NOTE — ED Notes (Signed)
Pt returned from CT °

## 2015-09-23 NOTE — ED Notes (Signed)
Pt to CT

## 2015-09-23 NOTE — Progress Notes (Signed)
Pharmacy Antibiotic Note  Kathy Flores is a 25 y.o. female admitted on 09/23/2015 with pneumonia/asthma exacerbation?  Pharmacy has been consulted for levaquin dosing. She presented with SOB with incidental finding for trichomonas. MD has ordered levaquin for respiratory issue and flagyl for trichomonas. Respiratory issue bacterial vs viral?  Plan:  Levaquin 750mg  IV q24  Height: 5\' 1"  (154.9 cm) Weight: 200 lb (90.7 kg) IBW/kg (Calculated) : 47.8  Temp (24hrs), Avg:99.1 F (37.3 C), Min:99.1 F (37.3 C), Max:99.1 F (37.3 C)   Recent Labs Lab 09/23/15 1214 09/23/15 1233 09/23/15 1353 09/23/15 1749  WBC 14.6*  --   --   --   CREATININE 1.02* 0.90  --   --   LATICACIDVEN  --   --  8.13* 6.71*    Estimated Creatinine Clearance: 98.1 mL/min (by C-G formula based on SCr of 0.9 mg/dL).    Allergies  Allergen Reactions  . Penicillins Hives and Rash    Has patient had a PCN reaction causing immediate rash, facial/tongue/throat swelling, SOB or lightheadedness with hypotension: Yes Has patient had a PCN reaction causing severe rash involving mucus membranes or skin necrosis: No Has patient had a PCN reaction that required hospitalization No Has patient had a PCN reaction occurring within the last 10 years: Yes If all of the above answers are "NO", then may proceed with Cephalosporin use.     Antimicrobials this admission: 9/17 Levaquin >>   Dose adjustments this admission:   Microbiology results: 9/17 BCx:  9/17 UCx:    Onnie Boer, PharmD Pager: [phone] 09/23/2015 9:04 PM

## 2015-09-23 NOTE — ED Notes (Signed)
Patient transported to X-ray 

## 2015-09-23 NOTE — ED Triage Notes (Signed)
Per pt, Pt is coming from home with complaints of SOB, wheezing, and chest pain. Pt reports it started yesterday with a sore throat. Pt reports trying a nebulizer with no relief. No longer has albuterol inhalers. Pt has wheezing noted in all lung fields upon assessment.

## 2015-09-23 NOTE — Progress Notes (Addendum)
Pharmacy Antibiotic Note  Kathy Flores is a 25 y.o. female admitted on 09/23/2015 with sepsis.  Pharmacy has been consulted for Vancomycin and Cefepime dosing.  Patient is hypotensive and tachycardic. WBC is 14.6. Tm 99.1.  SCr is 0.90/est CrCl ~98 mL/min  Plan: Vancomycin 1500mg  IV now, then 1g  IV every 8 hours.  Goal trough 10-15 mcg/mL. Cefepime 2g IV every 8 hours  Monitor renal function, clinical status, and culture results  Height: 5\' 1"  (154.9 cm) Weight: 200 lb (90.7 kg) IBW/kg (Calculated) : 47.8  Temp (24hrs), Avg:99.1 F (37.3 C), Min:99.1 F (37.3 C), Max:99.1 F (37.3 C)   Recent Labs Lab 09/23/15 1214 09/23/15 1233  WBC 14.6*  --   CREATININE  --  0.90    Estimated Creatinine Clearance: 98.1 mL/min (by C-G formula based on SCr of 0.9 mg/dL).    Allergies  Allergen Reactions  . Penicillins Hives and Rash    Has patient had a PCN reaction causing immediate rash, facial/tongue/throat swelling, SOB or lightheadedness with hypotension: Yes Has patient had a PCN reaction causing severe rash involving mucus membranes or skin necrosis: No Has patient had a PCN reaction that required hospitalization No Has patient had a PCN reaction occurring within the last 10 years: Yes If all of the above answers are "NO", then may proceed with Cephalosporin use.   -- Patient has tolerated Ancef in the past  Antimicrobials this admission: 9/17 Vancomycin >> 9/17 Cefepime >> 9/17 Flagyl >>  Dose adjustments this admission: na  Microbiology results: 9/17 BCx:  9/17 UCx:   Thank you for allowing pharmacy to be a part of this patient's care.  Sloan Leiter, PharmD, BCPS Clinical Pharmacist [phone] 09/23/2015 1:35 PM

## 2015-09-23 NOTE — ED Notes (Signed)
Pt ambulatory onto bedside commode, pt laughing and talking on her cell phone.

## 2015-09-23 NOTE — Consult Note (Signed)
Name: Kathy Flores MRN: [id number] DOB: 1990/12/15    ADMISSION DATE:  09/23/2015 CONSULTATION DATE:  09/23/15  REFERRING MD : EDP  CHIEF COMPLAINT:  Shortness of breath  HISTORY OF PRESENT ILLNESS:   Kathy Flores is a 39F who presented to the ED with shortness of breath, wheezing, and lightheadedness. She reports symptoms started a few days ago with URI like symptoms (cough, congestion, rhinitis), and provoked an asthma attack. She has mild intermittent asthma at baseline and only uses a rescue inhaler. She had difficulty breathing last night and ran out of albuterol, so she presented to the ED this morning. She also complained of some mild lower abdominal cramping. In the ED, she was given albuterol, steroids and a pelvic exam. She did have trichomonas and was given a dose of iv flagyl. Reportedly she had low blood pressures on admission. A lactate was drawn, which was elevated. She was given several fluid boluses and additional antibiotics out of concern for sepsis. Her blood pressures recovered, but her lactate remained elevated and was consistent with a blood gas that showed pH 7.26, pCO2 25.4,pO2 84 on RA. She has no history of liver disease and denies fevers / chills / sputum / hemoptysis / nausea / vomiting / diarrhea / constipation / abdominal pain / rashes / sores / ulcers / bloody or tarry stools / vaginal discharge.  Other labs of note include mildly elevated WBC at 14.6, mild anemia Hgb 10.9, PLT 245, NA 137, K 2.9, CL 100, CO2 19, BUN 5, Cr. 0.90, glucose 205, toponin WNL, initial lactate 8, repeat several hours later 6.71.   PAST MEDICAL HISTORY :   has a past medical history of Asthma; Depression; Fibroid; Trichomonas infection; and Vaginal Pap smear, abnormal.  has a past surgical history that includes No past surgeries and Cesarean section (N/A, 11/23/2013). Prior to Admission medications   Medication Sig Start Date End Date Taking? Authorizing Provider  albuterol  (PROVENTIL) (5 MG/ML) 0.5% nebulizer solution Take 0.5 mLs (2.5 mg total) by nebulization every 6 (six) hours as needed for wheezing or shortness of breath. 08/20/15  Yes Larene Pickett, PA-C  budesonide (PULMICORT) 180 MCG/ACT inhaler Inhale 1 puff into the lungs 2 (two) times daily. 08/20/15  Yes Larene Pickett, PA-C  escitalopram (LEXAPRO) 10 MG tablet Take 10 mg by mouth every morning.   Yes Historical Provider, MD   Allergies  Allergen Reactions  . Penicillins Hives and Rash    Has patient had a PCN reaction causing immediate rash, facial/tongue/throat swelling, SOB or lightheadedness with hypotension: Yes Has patient had a PCN reaction causing severe rash involving mucus membranes or skin necrosis: No Has patient had a PCN reaction that required hospitalization No Has patient had a PCN reaction occurring within the last 10 years: Yes If all of the above answers are "NO", then may proceed with Cephalosporin use.     FAMILY HISTORY:  family history is not on file. SOCIAL HISTORY:  reports that she has quit smoking. She has never used smokeless tobacco. She reports that she does not drink alcohol or use drugs.  REVIEW OF SYSTEMS:   Constitutional: Negative for fever, chills, weight loss, malaise/fatigue and diaphoresis.  HENT: Negative for hearing loss, ear pain, nosebleeds, congestion, sore throat, neck pain, tinnitus and ear discharge.   Eyes: Negative for blurred vision, double vision, photophobia, pain, discharge and redness.  Respiratory: Negative for cough, hemoptysis, sputum production, shortness of breath, wheezing and stridor.   Cardiovascular: Negative  for chest pain, palpitations, orthopnea, claudication, leg swelling and PND.  Gastrointestinal: Negative for heartburn, nausea, vomiting, abdominal pain, diarrhea, constipation, blood in stool and melena.  Genitourinary: Negative for dysuria, urgency, frequency, hematuria and flank pain.  Musculoskeletal: Negative for myalgias,  back pain, joint pain and falls.  Skin: Negative for itching and rash.  Neurological: Negative for dizziness, tingling, tremors, sensory change, speech change, focal weakness, seizures, loss of consciousness, weakness and headaches.  Endo/Heme/Allergies: Negative for environmental allergies and polydipsia. Does not bruise/bleed easily.  SUBJECTIVE:   VITAL SIGNS: Temp:  [99.1 F (37.3 C)] 99.1 F (37.3 C) (09/17 0826) Pulse Rate:  [91-131] 100 (09/17 1900) Resp:  [12-30] 23 (09/17 1900) BP: (90-134)/(1-84) 134/60 (09/17 1900) SpO2:  [94 %-100 %] 100 % (09/17 1900) Weight:  [90.7 kg (200 lb)] 90.7 kg (200 lb) (09/17 0826)  PHYSICAL EXAMINATION:  General Well nourished, well developed, no apparent distress  HEENT No gross abnormalities. Oropharynx clear. Mallampati IV. good dentition.   Pulmonary Diminished bilaterally with expiratory wheeze. No rales or ronchi. Good effort, symmetrical expansion.   Cardiovascular Tachy 110s, regular rhythm. S1, s2. No m/r/g. Distal pulses palpable.  Abdomen Soft, non-tender, non-distended, positive bowel sounds, no palpable organomegaly or masses. Normoresonant to percussion.  Musculoskeletal Moves all extremities well. Normal bulk and tone  Lymphatics No cervical, supraclavicular or axillary adenopathy.   Neurologic Grossly intact. No focal deficits.   Skin/Integuement No rash, no cyanosis, no clubbing. No edema.     Recent Labs Lab 09/23/15 1214 09/23/15 1233  NA 134* 137  K 3.1* 2.9*  CL 103 100*  CO2 19*  --   BUN 6 5*  CREATININE 1.02* 0.90  GLUCOSE 195* 205*    Recent Labs Lab 09/23/15 1214 09/23/15 1233  HGB 9.6* 10.9*  HCT 31.9* 32.0*  WBC 14.6*  --   PLT 245  --    Dg Chest 2 View  Result Date: 09/23/2015 CLINICAL DATA:  Patient with cough and shortness of breath. Mid sternal chest pain. EXAM: CHEST  2 VIEW COMPARISON:  Chest radiograph 08/20/2015 FINDINGS: Normal cardiac and mediastinal contours. No consolidative  pulmonary opacities. No pleural effusion or pneumothorax. Regional skeleton is unremarkable. IMPRESSION: No active cardiopulmonary disease. Electronically Signed   By: Lovey Newcomer M.D.   On: 09/23/2015 09:12   Ct Angio Chest Pe W And/or Wo Contrast  Result Date: 09/23/2015 CLINICAL DATA:  25 year old female with severe sepsis, shortness of breath and chest pain. EXAM: CT ANGIOGRAPHY CHEST CT ABDOMEN AND PELVIS WITH CONTRAST TECHNIQUE: Multidetector CT imaging of the chest was performed using the standard protocol during bolus administration of intravenous contrast. Multiplanar CT image reconstructions and MIPs were obtained to evaluate the vascular anatomy. Multidetector CT imaging of the abdomen and pelvis was performed using the standard protocol during bolus administration of intravenous contrast. CONTRAST:  100 cc Isovue 370 IV. COMPARISON:  None. FINDINGS: CTA CHEST FINDINGS Cardiovascular: The study is moderate quality for the evaluation of pulmonary embolism, with limited evaluation of the subsegmental pulmonary arteries due to motion and limited contrast opacification. There are no filling defects in the central, lobar or segmental pulmonary artery branches to suggest acute pulmonary embolism. Normal course and caliber of the thoracic aorta. Top-normal caliber main pulmonary artery (3.2 cm diameter). Normal heart size. No significant pericardial fluid/thickening. Mediastinum/Nodes: No discrete thyroid nodules. Unremarkable esophagus. No pathologically enlarged axillary, mediastinal or hilar lymph nodes. Lungs/Pleura: No pneumothorax. No pleural effusion. No acute consolidative airspace disease, lung masses or significant pulmonary  nodules. There are mild patchy ground-glass opacities in the upper lungs, most prominent in the right upper lobe. Musculoskeletal:  No aggressive appearing focal osseous lesions. Review of the MIP images confirms the above findings. CT ABDOMEN and PELVIS FINDINGS Motion  degraded study . Hepatobiliary: Normal liver with no liver mass. Normal gallbladder with no radiopaque cholelithiasis. No biliary ductal dilatation. Pancreas: Normal, with no mass or duct dilation. Spleen: Normal size. No mass. Adrenals/Urinary Tract: Normal adrenals. Normal kidneys with no hydronephrosis and no renal mass. Normal bladder. Stomach/Bowel: Grossly normal stomach. Normal caliber small bowel with no small bowel wall thickening. Normal appendix . Normal large bowel with no diverticulosis, large bowel wall thickening or pericolonic fat stranding. Vascular/Lymphatic: Normal caliber abdominal aorta. Patent portal, splenic, hepatic and renal veins. No pathologically enlarged lymph nodes in the abdomen or pelvis. Reproductive: Grossly normal uterus.  No adnexal mass. Other: No pneumoperitoneum, ascites or focal fluid collection. Musculoskeletal: No aggressive appearing focal osseous lesions. Review of the MIP images confirms the above findings. IMPRESSION: 1. No evidence of pulmonary embolism, with limitations as described . 2. Mild patchy ground-glass opacities in the upper lungs, with a broad differential including atypical infection, vasculitis or other inflammatory etiologies. 3. No acute abnormality in the abdomen or pelvis. No evidence of bowel obstruction or acute bowel inflammation. Normal appendix. Electronically Signed   By: Ilona Sorrel M.D.   On: 09/23/2015 18:08   Ct Abdomen Pelvis W Contrast  Result Date: 09/23/2015 CLINICAL DATA:  25 year old female with severe sepsis, shortness of breath and chest pain. EXAM: CT ANGIOGRAPHY CHEST CT ABDOMEN AND PELVIS WITH CONTRAST TECHNIQUE: Multidetector CT imaging of the chest was performed using the standard protocol during bolus administration of intravenous contrast. Multiplanar CT image reconstructions and MIPs were obtained to evaluate the vascular anatomy. Multidetector CT imaging of the abdomen and pelvis was performed using the standard protocol  during bolus administration of intravenous contrast. CONTRAST:  100 cc Isovue 370 IV. COMPARISON:  None. FINDINGS: CTA CHEST FINDINGS Cardiovascular: The study is moderate quality for the evaluation of pulmonary embolism, with limited evaluation of the subsegmental pulmonary arteries due to motion and limited contrast opacification. There are no filling defects in the central, lobar or segmental pulmonary artery branches to suggest acute pulmonary embolism. Normal course and caliber of the thoracic aorta. Top-normal caliber main pulmonary artery (3.2 cm diameter). Normal heart size. No significant pericardial fluid/thickening. Mediastinum/Nodes: No discrete thyroid nodules. Unremarkable esophagus. No pathologically enlarged axillary, mediastinal or hilar lymph nodes. Lungs/Pleura: No pneumothorax. No pleural effusion. No acute consolidative airspace disease, lung masses or significant pulmonary nodules. There are mild patchy ground-glass opacities in the upper lungs, most prominent in the right upper lobe. Musculoskeletal:  No aggressive appearing focal osseous lesions. Review of the MIP images confirms the above findings. CT ABDOMEN and PELVIS FINDINGS Motion degraded study . Hepatobiliary: Normal liver with no liver mass. Normal gallbladder with no radiopaque cholelithiasis. No biliary ductal dilatation. Pancreas: Normal, with no mass or duct dilation. Spleen: Normal size. No mass. Adrenals/Urinary Tract: Normal adrenals. Normal kidneys with no hydronephrosis and no renal mass. Normal bladder. Stomach/Bowel: Grossly normal stomach. Normal caliber small bowel with no small bowel wall thickening. Normal appendix . Normal large bowel with no diverticulosis, large bowel wall thickening or pericolonic fat stranding. Vascular/Lymphatic: Normal caliber abdominal aorta. Patent portal, splenic, hepatic and renal veins. No pathologically enlarged lymph nodes in the abdomen or pelvis. Reproductive: Grossly normal uterus.  No  adnexal mass. Other: No pneumoperitoneum, ascites  or focal fluid collection. Musculoskeletal: No aggressive appearing focal osseous lesions. Review of the MIP images confirms the above findings. IMPRESSION: 1. No evidence of pulmonary embolism, with limitations as described . 2. Mild patchy ground-glass opacities in the upper lungs, with a broad differential including atypical infection, vasculitis or other inflammatory etiologies. 3. No acute abnormality in the abdomen or pelvis. No evidence of bowel obstruction or acute bowel inflammation. Normal appendix. Electronically Signed   By: Ilona Sorrel M.D.   On: 09/23/2015 18:08    ASSESSMENT / PLAN:  1. Acute asthma exacerbation, likely secondary to viral URI 2. SIRS 3. Elevated lactate w/ metabolic acidosis 4. Hypokalemia  Ms. Sanpedro is a 58F presenting with acute asthma exacerbation and incidental finding of trichomonas. After receiving several breathing treatments, she was noted to have a markedly elevated lactate that has downtrended with fluids. She also had some intermittently low blood pressures, which seem to have responded to fluids. She has been started on flagyl, vancomycin and cefepime, although my index of suspicion for a bacterial infection causing sepsis is low. Currently she is saturating adequately on room air, breathing comfortably, and has adequate blood pressures with good UOP.    Admit to intermediate care  DuoNebs q4h prn, increase interval as symptoms improve  Continue steroids with prednisone 60 followed by a 5-7 day taper depending on her response  Trend lactate and VBGs to ensure adequate clearance of lactate  Check hepatic function panel  Replace K aggressively  Follow cultures and deescalate antibiotics as able  Yisroel Ramming, MD [facility] Pager: [phone]  09/23/2015, 7:18 PM

## 2015-09-24 DIAGNOSIS — E872 Acidosis, unspecified: Secondary | ICD-10-CM

## 2015-09-24 DIAGNOSIS — J45901 Unspecified asthma with (acute) exacerbation: Secondary | ICD-10-CM

## 2015-09-24 LAB — CBC WITH DIFFERENTIAL/PLATELET
Basophils Absolute: 0 10*3/uL (ref 0.0–0.1)
Basophils Relative: 0 %
EOS ABS: 0 10*3/uL (ref 0.0–0.7)
Eosinophils Relative: 0 %
HEMATOCRIT: 27.3 % — AB (ref 36.0–46.0)
HEMOGLOBIN: 8.2 g/dL — AB (ref 12.0–15.0)
LYMPHS ABS: 0.6 10*3/uL — AB (ref 0.7–4.0)
Lymphocytes Relative: 5 %
MCH: 24.6 pg — AB (ref 26.0–34.0)
MCHC: 30 g/dL (ref 30.0–36.0)
MCV: 81.7 fL (ref 78.0–100.0)
MONO ABS: 0.3 10*3/uL (ref 0.1–1.0)
MONOS PCT: 3 %
NEUTROS ABS: 10.8 10*3/uL — AB (ref 1.7–7.7)
NEUTROS PCT: 92 %
Platelets: 328 10*3/uL (ref 150–400)
RBC: 3.34 MIL/uL — ABNORMAL LOW (ref 3.87–5.11)
RDW: 15 % (ref 11.5–15.5)
WBC: 11.7 10*3/uL — ABNORMAL HIGH (ref 4.0–10.5)

## 2015-09-24 LAB — BLOOD CULTURE ID PANEL (REFLEXED)
Acinetobacter baumannii: NOT DETECTED
CANDIDA KRUSEI: NOT DETECTED
Candida albicans: NOT DETECTED
Candida glabrata: NOT DETECTED
Candida parapsilosis: NOT DETECTED
Candida tropicalis: NOT DETECTED
ENTEROCOCCUS SPECIES: NOT DETECTED
Enterobacter cloacae complex: NOT DETECTED
Enterobacteriaceae species: NOT DETECTED
Escherichia coli: NOT DETECTED
HAEMOPHILUS INFLUENZAE: NOT DETECTED
Klebsiella oxytoca: NOT DETECTED
Klebsiella pneumoniae: NOT DETECTED
LISTERIA MONOCYTOGENES: NOT DETECTED
Methicillin resistance: NOT DETECTED
NEISSERIA MENINGITIDIS: NOT DETECTED
PROTEUS SPECIES: NOT DETECTED
Pseudomonas aeruginosa: NOT DETECTED
SERRATIA MARCESCENS: NOT DETECTED
STAPHYLOCOCCUS AUREUS BCID: NOT DETECTED
STAPHYLOCOCCUS SPECIES: DETECTED — AB
STREPTOCOCCUS AGALACTIAE: NOT DETECTED
STREPTOCOCCUS SPECIES: NOT DETECTED
Streptococcus pneumoniae: NOT DETECTED
Streptococcus pyogenes: NOT DETECTED

## 2015-09-24 LAB — COMPREHENSIVE METABOLIC PANEL
ALK PHOS: 41 U/L (ref 38–126)
ALT: 13 U/L — ABNORMAL LOW (ref 14–54)
ANION GAP: 7 (ref 5–15)
AST: 14 U/L — ABNORMAL LOW (ref 15–41)
Albumin: 3.1 g/dL — ABNORMAL LOW (ref 3.5–5.0)
BILIRUBIN TOTAL: 0.1 mg/dL — AB (ref 0.3–1.2)
BUN: <5 mg/dL — ABNORMAL LOW (ref 6–20)
CALCIUM: 7.7 mg/dL — AB (ref 8.9–10.3)
CO2: 16 mmol/L — ABNORMAL LOW (ref 22–32)
Chloride: 113 mmol/L — ABNORMAL HIGH (ref 101–111)
Creatinine, Ser: 0.77 mg/dL (ref 0.44–1.00)
GFR calc non Af Amer: >60 mL/min (ref >60)
Glucose, Bld: 119 mg/dL — ABNORMAL HIGH (ref 65–99)
Potassium: 4.5 mmol/L (ref 3.5–5.1)
Sodium: 136 mmol/L (ref 135–145)
TOTAL PROTEIN: 6.7 g/dL (ref 6.5–8.1)

## 2015-09-24 LAB — LACTIC ACID, PLASMA: LACTIC ACID, VENOUS: 1.7 mmol/L (ref 0.5–1.9)

## 2015-09-24 LAB — URINE CULTURE

## 2015-09-24 LAB — GC/CHLAMYDIA PROBE AMP (~~LOC~~) NOT AT ARMC
CHLAMYDIA, DNA PROBE: NEGATIVE
NEISSERIA GONORRHEA: NEGATIVE

## 2015-09-24 LAB — MRSA PCR SCREENING: MRSA BY PCR: NEGATIVE

## 2015-09-24 MED ORDER — MOMETASONE FURO-FORMOTEROL FUM 200-5 MCG/ACT IN AERO
2.0000 | INHALATION_SPRAY | Freq: Two times a day (BID) | RESPIRATORY_TRACT | Status: DC
  Administered 2015-09-24 – 2015-09-28 (×7): 2 via RESPIRATORY_TRACT
  Filled 2015-09-24: qty 8.8

## 2015-09-24 MED ORDER — METRONIDAZOLE 500 MG PO TABS
1500.0000 mg | ORAL_TABLET | Freq: Once | ORAL | Status: AC
  Administered 2015-09-24: 1500 mg via ORAL
  Filled 2015-09-24: qty 3

## 2015-09-24 NOTE — Progress Notes (Signed)
  PHARMACY - PHYSICIAN COMMUNICATION CRITICAL VALUE ALERT - BLOOD CULTURE IDENTIFICATION (BCID)  Results for orders placed or performed during the hospital encounter of 09/23/15  Blood Culture ID Panel (Reflexed) (Collected: 09/23/2015  1:43 PM)  Result Value Ref Range   Enterococcus species NOT DETECTED NOT DETECTED   Listeria monocytogenes NOT DETECTED NOT DETECTED   Staphylococcus species DETECTED (A) NOT DETECTED   Staphylococcus aureus NOT DETECTED NOT DETECTED   Methicillin resistance NOT DETECTED NOT DETECTED   Streptococcus species NOT DETECTED NOT DETECTED   Streptococcus agalactiae NOT DETECTED NOT DETECTED   Streptococcus pneumoniae NOT DETECTED NOT DETECTED   Streptococcus pyogenes NOT DETECTED NOT DETECTED   Acinetobacter baumannii NOT DETECTED NOT DETECTED   Enterobacteriaceae species NOT DETECTED NOT DETECTED   Enterobacter cloacae complex NOT DETECTED NOT DETECTED   Escherichia coli NOT DETECTED NOT DETECTED   Klebsiella oxytoca NOT DETECTED NOT DETECTED   Klebsiella pneumoniae NOT DETECTED NOT DETECTED   Proteus species NOT DETECTED NOT DETECTED   Serratia marcescens NOT DETECTED NOT DETECTED   Haemophilus influenzae NOT DETECTED NOT DETECTED   Neisseria meningitidis NOT DETECTED NOT DETECTED   Pseudomonas aeruginosa NOT DETECTED NOT DETECTED   Candida albicans NOT DETECTED NOT DETECTED   Candida glabrata NOT DETECTED NOT DETECTED   Candida krusei NOT DETECTED NOT DETECTED   Candida parapsilosis NOT DETECTED NOT DETECTED   Candida tropicalis NOT DETECTED NOT DETECTED    Name of physician (or Provider) Contacted: Paged - no response  Changes to prescribed antibiotics required: Blood cx most likely contaminant. Recommend continuing Levaquin for possible PNA / asthma exacerbation.  Elenor Quinones, PharmD, BCPS Clinical Pharmacist Pager [phone] 09/24/2015 1:41 PM

## 2015-09-24 NOTE — Progress Notes (Signed)
[facility] PROGRESS NOTE  Kathy Flores [id number] DOB: Aug 22, 1990 DOA: 09/23/2015   PCP: No PCP Per Patient     Assessment/Plan: Principal Problem:   Acute respiratory failure with hypoxemia ([place]) Active Problems:   SIRS (systemic inflammatory response syndrome) (HCC)   Normochromic normocytic anemia   Asthma exacerbation   Metabolic acidosis   24 yr F who presented to the ED with shortness of breath, wheezing, and lightheadedness. She reports symptoms started a few days ago with URI like symptoms (cough, congestion, rhinitis), and provoked an asthma attack. She has mild intermittent asthma at baseline and only uses a rescue inhaler. She had difficulty breathing last night and ran out of albuterol, so she presented to the ED this morning. She also complained of some mild lower abdominal cramping. In the ED, she was given albuterol, steroids and a pelvic exam. She did have trichomonas and was given a dose of iv flagyl. Reportedly she had low blood pressures on admission. A lactate was drawn, which was elevated. She was given several fluid boluses and additional antibiotics out of concern for sepsis. Her blood pressures recovered, but her lactate remained elevated and was consistent with a blood gas that showed pH 7.26, pCO2 25.4,pO2 84 on RA.    Assessment and plan 1. Acute respiratory failure with hypoxemia secondary to asthma exacerbation -continue prednisone, status post IV Solu-Medrol nebulizer and Pulmicort. Continue nebulizer treatments. Patient was given broad-spectrum antibiotics yesterday due to elevated lactate.   now on Levaquin  . Follow lactate levels and blood cultures. Check respiratory viral panel. Repeat VBG showed improvement. Currently 99% on room air 2. SIRS/Metabolic acidosis with elevated lactate - appreciate pulmonary critical care consult. Lactate elevated due to nebulizer use and shortness of breath as per pulmonary critical care. Continue with  hydration and closely follow lactate and metabolic panel. 3. Trichomonas -  given 2 g of Flagyl 4. Normocytic normochromic anemia - follow CBC. Check anemia panel.   DVT prophylaxsis  Lovenox  Code Status:  Full code    Family Communication: Discussed in detail with the patient, all imaging results, lab results explained to the patient   Disposition Plan:  Anticipate discharge tomorrow      Consultants:  Critical care  Procedures:  None  Antibiotics: Anti-infectives    Start     Dose/Rate Route Frequency Ordered Stop   09/24/15 2200  levofloxacin (LEVAQUIN) IVPB 750 mg     750 mg 100 mL/hr over 90 Minutes Intravenous Every 24 hours 09/23/15 2120     09/24/15 1200  metroNIDAZOLE (FLAGYL) tablet 1,500 mg     1,500 mg Oral  Once 09/24/15 1152 09/24/15 1302      HPI/Subjective:  Still has some upper airway wheezing, tachycardic with just sitting up  Objective: Vitals:   09/24/15 0700 09/24/15 1116 09/24/15 1118 09/24/15 1224  BP: 100/61   113/71  Pulse: 83   87  Resp: (!) 22   16  Temp: 98.4 F (36.9 C)   99 F (37.2 C)  TempSrc: Oral   Oral  SpO2: 100% 100% 100% 99%  Weight:      Height:        Intake/Output Summary (Last 24 hours) at 09/24/15 1332 Last data filed at 09/24/15 0600  Gross per 24 hour  Intake           6262.5 ml  Output             3200 ml  Net           3062.5 ml    Exam:  Examination:  General exam: Appears calm and comfortable  Respiratory system: Clear to auscultation. Respiratory effort normal. Cardiovascular system: S1 & S2 heard, RRR. No JVD, murmurs, rubs, gallops or clicks. No pedal edema. Gastrointestinal system: Abdomen is nondistended, soft and nontender. No organomegaly or masses felt. Normal bowel sounds heard. Central nervous system: Alert and oriented. No focal neurological deficits. Extremities: Symmetric 5 x 5 power. Skin: No rashes, lesions or ulcers Psychiatry: Judgement and insight appear normal. Mood & affect  appropriate.     Data Reviewed: I have personally reviewed following labs and imaging studies  Micro Results Recent Results (from the past 240 hour(s))  Blood Culture (routine x 2)     Status: None (Preliminary result)   Collection Time: 09/23/15  1:43 PM  Result Value Ref Range Status   Specimen Description BLOOD LEFT ANTECUBITAL  Final   Special Requests BOTTLES DRAWN AEROBIC AND ANAEROBIC  5CC  Final   Culture  Setup Time   Final    GRAM POSITIVE COCCI IN CLUSTERS AEROBIC BOTTLE ONLY Organism ID to follow    Culture PENDING  Incomplete   Report Status PENDING  Incomplete  Wet prep, genital     Status: Abnormal   Collection Time: 09/23/15  3:45 PM  Result Value Ref Range Status   Yeast Wet Prep HPF POC NONE SEEN NONE SEEN Final   Trich, Wet Prep PRESENT (A) NONE SEEN Final   Clue Cells Wet Prep HPF POC NONE SEEN NONE SEEN Final   WBC, Wet Prep HPF POC MANY (A) NONE SEEN Final   Sperm NONE SEEN  Final  MRSA PCR Screening     Status: None   Collection Time: 09/23/15 11:54 PM  Result Value Ref Range Status   MRSA by PCR NEGATIVE NEGATIVE Final    Comment:        The GeneXpert MRSA Assay (FDA approved for NASAL specimens only), is one component of a comprehensive MRSA colonization surveillance program. It is not intended to diagnose MRSA infection nor to guide or monitor treatment for MRSA infections.     Radiology Reports Dg Chest 2 View  Result Date: 09/23/2015 CLINICAL DATA:  Patient with cough and shortness of breath. Mid sternal chest pain. EXAM: CHEST  2 VIEW COMPARISON:  Chest radiograph 08/20/2015 FINDINGS: Normal cardiac and mediastinal contours. No consolidative pulmonary opacities. No pleural effusion or pneumothorax. Regional skeleton is unremarkable. IMPRESSION: No active cardiopulmonary disease. Electronically Signed   By: Lovey Newcomer M.D.   On: 09/23/2015 09:12   Ct Angio Chest Pe W And/or Wo Contrast  Result Date: 09/23/2015 CLINICAL DATA:   25 year old female with severe sepsis, shortness of breath and chest pain. EXAM: CT ANGIOGRAPHY CHEST CT ABDOMEN AND PELVIS WITH CONTRAST TECHNIQUE: Multidetector CT imaging of the chest was performed using the standard protocol during bolus administration of intravenous contrast. Multiplanar CT image reconstructions and MIPs were obtained to evaluate the vascular anatomy. Multidetector CT imaging of the abdomen and pelvis was performed using the standard protocol during bolus administration of intravenous contrast. CONTRAST:  100 cc Isovue 370 IV. COMPARISON:  None. FINDINGS: CTA CHEST FINDINGS Cardiovascular: The study is moderate quality for the evaluation of pulmonary embolism, with limited evaluation of the subsegmental pulmonary arteries due to motion and limited contrast opacification. There are no filling defects in the central, lobar or segmental pulmonary artery branches to suggest acute pulmonary embolism. Normal  course and caliber of the thoracic aorta. Top-normal caliber main pulmonary artery (3.2 cm diameter). Normal heart size. No significant pericardial fluid/thickening. Mediastinum/Nodes: No discrete thyroid nodules. Unremarkable esophagus. No pathologically enlarged axillary, mediastinal or hilar lymph nodes. Lungs/Pleura: No pneumothorax. No pleural effusion. No acute consolidative airspace disease, lung masses or significant pulmonary nodules. There are mild patchy ground-glass opacities in the upper lungs, most prominent in the right upper lobe. Musculoskeletal:  No aggressive appearing focal osseous lesions. Review of the MIP images confirms the above findings. CT ABDOMEN and PELVIS FINDINGS Motion degraded study . Hepatobiliary: Normal liver with no liver mass. Normal gallbladder with no radiopaque cholelithiasis. No biliary ductal dilatation. Pancreas: Normal, with no mass or duct dilation. Spleen: Normal size. No mass. Adrenals/Urinary Tract: Normal adrenals. Normal kidneys with no  hydronephrosis and no renal mass. Normal bladder. Stomach/Bowel: Grossly normal stomach. Normal caliber small bowel with no small bowel wall thickening. Normal appendix . Normal large bowel with no diverticulosis, large bowel wall thickening or pericolonic fat stranding. Vascular/Lymphatic: Normal caliber abdominal aorta. Patent portal, splenic, hepatic and renal veins. No pathologically enlarged lymph nodes in the abdomen or pelvis. Reproductive: Grossly normal uterus.  No adnexal mass. Other: No pneumoperitoneum, ascites or focal fluid collection. Musculoskeletal: No aggressive appearing focal osseous lesions. Review of the MIP images confirms the above findings. IMPRESSION: 1. No evidence of pulmonary embolism, with limitations as described . 2. Mild patchy ground-glass opacities in the upper lungs, with a broad differential including atypical infection, vasculitis or other inflammatory etiologies. 3. No acute abnormality in the abdomen or pelvis. No evidence of bowel obstruction or acute bowel inflammation. Normal appendix. Electronically Signed   By: Ilona Sorrel M.D.   On: 09/23/2015 18:08   Ct Abdomen Pelvis W Contrast  Result Date: 09/23/2015 CLINICAL DATA:  25 year old female with severe sepsis, shortness of breath and chest pain. EXAM: CT ANGIOGRAPHY CHEST CT ABDOMEN AND PELVIS WITH CONTRAST TECHNIQUE: Multidetector CT imaging of the chest was performed using the standard protocol during bolus administration of intravenous contrast. Multiplanar CT image reconstructions and MIPs were obtained to evaluate the vascular anatomy. Multidetector CT imaging of the abdomen and pelvis was performed using the standard protocol during bolus administration of intravenous contrast. CONTRAST:  100 cc Isovue 370 IV. COMPARISON:  None. FINDINGS: CTA CHEST FINDINGS Cardiovascular: The study is moderate quality for the evaluation of pulmonary embolism, with limited evaluation of the subsegmental pulmonary arteries due to  motion and limited contrast opacification. There are no filling defects in the central, lobar or segmental pulmonary artery branches to suggest acute pulmonary embolism. Normal course and caliber of the thoracic aorta. Top-normal caliber main pulmonary artery (3.2 cm diameter). Normal heart size. No significant pericardial fluid/thickening. Mediastinum/Nodes: No discrete thyroid nodules. Unremarkable esophagus. No pathologically enlarged axillary, mediastinal or hilar lymph nodes. Lungs/Pleura: No pneumothorax. No pleural effusion. No acute consolidative airspace disease, lung masses or significant pulmonary nodules. There are mild patchy ground-glass opacities in the upper lungs, most prominent in the right upper lobe. Musculoskeletal:  No aggressive appearing focal osseous lesions. Review of the MIP images confirms the above findings. CT ABDOMEN and PELVIS FINDINGS Motion degraded study . Hepatobiliary: Normal liver with no liver mass. Normal gallbladder with no radiopaque cholelithiasis. No biliary ductal dilatation. Pancreas: Normal, with no mass or duct dilation. Spleen: Normal size. No mass. Adrenals/Urinary Tract: Normal adrenals. Normal kidneys with no hydronephrosis and no renal mass. Normal bladder. Stomach/Bowel: Grossly normal stomach. Normal caliber small bowel with no small bowel  wall thickening. Normal appendix . Normal large bowel with no diverticulosis, large bowel wall thickening or pericolonic fat stranding. Vascular/Lymphatic: Normal caliber abdominal aorta. Patent portal, splenic, hepatic and renal veins. No pathologically enlarged lymph nodes in the abdomen or pelvis. Reproductive: Grossly normal uterus.  No adnexal mass. Other: No pneumoperitoneum, ascites or focal fluid collection. Musculoskeletal: No aggressive appearing focal osseous lesions. Review of the MIP images confirms the above findings. IMPRESSION: 1. No evidence of pulmonary embolism, with limitations as described . 2. Mild patchy  ground-glass opacities in the upper lungs, with a broad differential including atypical infection, vasculitis or other inflammatory etiologies. 3. No acute abnormality in the abdomen or pelvis. No evidence of bowel obstruction or acute bowel inflammation. Normal appendix. Electronically Signed   By: Ilona Sorrel M.D.   On: 09/23/2015 18:08     CBC  Recent Labs Lab 09/23/15 1214 09/23/15 1233 09/23/15 2132 09/24/15 0159  WBC 14.6*  --  11.9* 11.7*  HGB 9.6* 10.9* 8.9* 8.2*  HCT 31.9* 32.0* 29.1* 27.3*  PLT 245  --  336 328  MCV 83.1  --  82.0 81.7  MCH 25.0*  --  25.1* 24.6*  MCHC 30.1  --  30.6 30.0  RDW 14.7  --  14.9 15.0  LYMPHSABS 1.0  --   --  0.6*  MONOABS 0.3  --   --  0.3  EOSABS 0.1  --   --  0.0  BASOSABS 0.0  --   --  0.0    Chemistries   Recent Labs Lab 09/23/15 1214 09/23/15 1233 09/23/15 2132 09/24/15 0159  NA 134* 137  --  136  K 3.1* 2.9*  --  4.5  CL 103 100*  --  113*  CO2 19*  --   --  16*  GLUCOSE 195* 205*  --  119*  BUN 6 5*  --  <5*  CREATININE 1.02* 0.90 0.95 0.77  CALCIUM 8.8*  --   --  7.7*  MG  --   --  2.0  --   AST  --   --   --  14*  ALT  --   --   --  13*  ALKPHOS  --   --   --  41  BILITOT  --   --   --  0.1*   ------------------------------------------------------------------------------------------------------------------ estimated creatinine clearance is 115.2 mL/min (by C-G formula based on SCr of 0.77 mg/dL). ------------------------------------------------------------------------------------------------------------------ No results for input(s): HGBA1C in the last 72 hours. ------------------------------------------------------------------------------------------------------------------ No results for input(s): CHOL, HDL, LDLCALC, TRIG, CHOLHDL, LDLDIRECT in the last 72 hours. ------------------------------------------------------------------------------------------------------------------ No results for input(s): TSH,  T4TOTAL, T3FREE, THYROIDAB in the last 72 hours.  Invalid input(s): FREET3 ------------------------------------------------------------------------------------------------------------------ No results for input(s): VITAMINB12, FOLATE, FERRITIN, TIBC, IRON, RETICCTPCT in the last 72 hours.  Coagulation profile No results for input(s): INR, PROTIME in the last 168 hours.  No results for input(s): DDIMER in the last 72 hours.  Cardiac Enzymes No results for input(s): CKMB, TROPONINI, MYOGLOBIN in the last 168 hours.  Invalid input(s): CK ------------------------------------------------------------------------------------------------------------------ Invalid input(s): POCBNP   CBG:  Recent Labs Lab 09/23/15 1249  GLUCAP 207*       Studies: Dg Chest 2 View  Result Date: 09/23/2015 CLINICAL DATA:  Patient with cough and shortness of breath. Mid sternal chest pain. EXAM: CHEST  2 VIEW COMPARISON:  Chest radiograph 08/20/2015 FINDINGS: Normal cardiac and mediastinal contours. No consolidative pulmonary opacities. No pleural effusion or pneumothorax. Regional skeleton is unremarkable. IMPRESSION:  No active cardiopulmonary disease. Electronically Signed   By: Lovey Newcomer M.D.   On: 09/23/2015 09:12   Ct Angio Chest Pe W And/or Wo Contrast  Result Date: 09/23/2015 CLINICAL DATA:  25 year old female with severe sepsis, shortness of breath and chest pain. EXAM: CT ANGIOGRAPHY CHEST CT ABDOMEN AND PELVIS WITH CONTRAST TECHNIQUE: Multidetector CT imaging of the chest was performed using the standard protocol during bolus administration of intravenous contrast. Multiplanar CT image reconstructions and MIPs were obtained to evaluate the vascular anatomy. Multidetector CT imaging of the abdomen and pelvis was performed using the standard protocol during bolus administration of intravenous contrast. CONTRAST:  100 cc Isovue 370 IV. COMPARISON:  None. FINDINGS: CTA CHEST FINDINGS Cardiovascular:  The study is moderate quality for the evaluation of pulmonary embolism, with limited evaluation of the subsegmental pulmonary arteries due to motion and limited contrast opacification. There are no filling defects in the central, lobar or segmental pulmonary artery branches to suggest acute pulmonary embolism. Normal course and caliber of the thoracic aorta. Top-normal caliber main pulmonary artery (3.2 cm diameter). Normal heart size. No significant pericardial fluid/thickening. Mediastinum/Nodes: No discrete thyroid nodules. Unremarkable esophagus. No pathologically enlarged axillary, mediastinal or hilar lymph nodes. Lungs/Pleura: No pneumothorax. No pleural effusion. No acute consolidative airspace disease, lung masses or significant pulmonary nodules. There are mild patchy ground-glass opacities in the upper lungs, most prominent in the right upper lobe. Musculoskeletal:  No aggressive appearing focal osseous lesions. Review of the MIP images confirms the above findings. CT ABDOMEN and PELVIS FINDINGS Motion degraded study . Hepatobiliary: Normal liver with no liver mass. Normal gallbladder with no radiopaque cholelithiasis. No biliary ductal dilatation. Pancreas: Normal, with no mass or duct dilation. Spleen: Normal size. No mass. Adrenals/Urinary Tract: Normal adrenals. Normal kidneys with no hydronephrosis and no renal mass. Normal bladder. Stomach/Bowel: Grossly normal stomach. Normal caliber small bowel with no small bowel wall thickening. Normal appendix . Normal large bowel with no diverticulosis, large bowel wall thickening or pericolonic fat stranding. Vascular/Lymphatic: Normal caliber abdominal aorta. Patent portal, splenic, hepatic and renal veins. No pathologically enlarged lymph nodes in the abdomen or pelvis. Reproductive: Grossly normal uterus.  No adnexal mass. Other: No pneumoperitoneum, ascites or focal fluid collection. Musculoskeletal: No aggressive appearing focal osseous lesions. Review  of the MIP images confirms the above findings. IMPRESSION: 1. No evidence of pulmonary embolism, with limitations as described . 2. Mild patchy ground-glass opacities in the upper lungs, with a broad differential including atypical infection, vasculitis or other inflammatory etiologies. 3. No acute abnormality in the abdomen or pelvis. No evidence of bowel obstruction or acute bowel inflammation. Normal appendix. Electronically Signed   By: Ilona Sorrel M.D.   On: 09/23/2015 18:08   Ct Abdomen Pelvis W Contrast  Result Date: 09/23/2015 CLINICAL DATA:  25 year old female with severe sepsis, shortness of breath and chest pain. EXAM: CT ANGIOGRAPHY CHEST CT ABDOMEN AND PELVIS WITH CONTRAST TECHNIQUE: Multidetector CT imaging of the chest was performed using the standard protocol during bolus administration of intravenous contrast. Multiplanar CT image reconstructions and MIPs were obtained to evaluate the vascular anatomy. Multidetector CT imaging of the abdomen and pelvis was performed using the standard protocol during bolus administration of intravenous contrast. CONTRAST:  100 cc Isovue 370 IV. COMPARISON:  None. FINDINGS: CTA CHEST FINDINGS Cardiovascular: The study is moderate quality for the evaluation of pulmonary embolism, with limited evaluation of the subsegmental pulmonary arteries due to motion and limited contrast opacification. There are no filling defects in  the central, lobar or segmental pulmonary artery branches to suggest acute pulmonary embolism. Normal course and caliber of the thoracic aorta. Top-normal caliber main pulmonary artery (3.2 cm diameter). Normal heart size. No significant pericardial fluid/thickening. Mediastinum/Nodes: No discrete thyroid nodules. Unremarkable esophagus. No pathologically enlarged axillary, mediastinal or hilar lymph nodes. Lungs/Pleura: No pneumothorax. No pleural effusion. No acute consolidative airspace disease, lung masses or significant pulmonary nodules.  There are mild patchy ground-glass opacities in the upper lungs, most prominent in the right upper lobe. Musculoskeletal:  No aggressive appearing focal osseous lesions. Review of the MIP images confirms the above findings. CT ABDOMEN and PELVIS FINDINGS Motion degraded study . Hepatobiliary: Normal liver with no liver mass. Normal gallbladder with no radiopaque cholelithiasis. No biliary ductal dilatation. Pancreas: Normal, with no mass or duct dilation. Spleen: Normal size. No mass. Adrenals/Urinary Tract: Normal adrenals. Normal kidneys with no hydronephrosis and no renal mass. Normal bladder. Stomach/Bowel: Grossly normal stomach. Normal caliber small bowel with no small bowel wall thickening. Normal appendix . Normal large bowel with no diverticulosis, large bowel wall thickening or pericolonic fat stranding. Vascular/Lymphatic: Normal caliber abdominal aorta. Patent portal, splenic, hepatic and renal veins. No pathologically enlarged lymph nodes in the abdomen or pelvis. Reproductive: Grossly normal uterus.  No adnexal mass. Other: No pneumoperitoneum, ascites or focal fluid collection. Musculoskeletal: No aggressive appearing focal osseous lesions. Review of the MIP images confirms the above findings. IMPRESSION: 1. No evidence of pulmonary embolism, with limitations as described . 2. Mild patchy ground-glass opacities in the upper lungs, with a broad differential including atypical infection, vasculitis or other inflammatory etiologies. 3. No acute abnormality in the abdomen or pelvis. No evidence of bowel obstruction or acute bowel inflammation. Normal appendix. Electronically Signed   By: Ilona Sorrel M.D.   On: 09/23/2015 18:08      No results found for: HGBA1C Lab Results  Component Value Date   CREATININE 0.77 09/24/2015       Scheduled Meds: . budesonide (PULMICORT) nebulizer solution  0.25 mg Nebulization BID  . enoxaparin (LOVENOX) injection  40 mg Subcutaneous QHS  . escitalopram  10  mg Oral q morning - 10a  . ipratropium-albuterol  3 mL Nebulization Q4H  . levofloxacin (LEVAQUIN) IV  750 mg Intravenous Q24H  . predniSONE  60 mg Oral Q breakfast   Continuous Infusions: . sodium chloride 125 mL/hr at 09/24/15 1303     LOS: 1 day    Time spent: >30 MINS    [facility]  [facility] Pager [phone]. If 7PM-7AM, please contact night-coverage at [email or web address], password [facility] 09/24/2015, 1:32 PM  LOS: 1 day

## 2015-09-25 LAB — RESPIRATORY PANEL BY PCR
Adenovirus: NOT DETECTED
BORDETELLA PERTUSSIS-RVPCR: NOT DETECTED
CORONAVIRUS HKU1-RVPPCR: NOT DETECTED
CORONAVIRUS OC43-RVPPCR: NOT DETECTED
Chlamydophila pneumoniae: NOT DETECTED
Coronavirus 229E: NOT DETECTED
Coronavirus NL63: NOT DETECTED
INFLUENZA B-RVPPCR: NOT DETECTED
Influenza A: NOT DETECTED
METAPNEUMOVIRUS-RVPPCR: NOT DETECTED
Mycoplasma pneumoniae: NOT DETECTED
PARAINFLUENZA VIRUS 1-RVPPCR: NOT DETECTED
PARAINFLUENZA VIRUS 2-RVPPCR: NOT DETECTED
PARAINFLUENZA VIRUS 3-RVPPCR: NOT DETECTED
Parainfluenza Virus 4: NOT DETECTED
RESPIRATORY SYNCYTIAL VIRUS-RVPPCR: NOT DETECTED
RHINOVIRUS / ENTEROVIRUS - RVPPCR: DETECTED — AB

## 2015-09-25 MED ORDER — METHYLPREDNISOLONE SODIUM SUCC 125 MG IJ SOLR
125.0000 mg | Freq: Two times a day (BID) | INTRAMUSCULAR | Status: DC
  Administered 2015-09-25 – 2015-09-27 (×6): 125 mg via INTRAVENOUS
  Filled 2015-09-25 (×6): qty 2

## 2015-09-25 MED ORDER — PREDNISONE 10 MG PO TABS: ORAL_TABLET | ORAL | 0 refills | Status: DC

## 2015-09-25 MED ORDER — LEVOFLOXACIN 750 MG PO TABS
750.0000 mg | ORAL_TABLET | Freq: Every day | ORAL | 0 refills | Status: DC
Start: 2015-09-25 — End: 2015-09-28

## 2015-09-25 MED ORDER — GUAIFENESIN-DM 100-10 MG/5ML PO SYRP
5.0000 mL | ORAL_SOLUTION | Freq: Four times a day (QID) | ORAL | Status: DC | PRN
  Administered 2015-09-25: 5 mL via ORAL
  Filled 2015-09-25: qty 5

## 2015-09-25 MED ORDER — IPRATROPIUM-ALBUTEROL 0.5-2.5 (3) MG/3ML IN SOLN
3.0000 mL | Freq: Three times a day (TID) | RESPIRATORY_TRACT | Status: DC
  Administered 2015-09-25 – 2015-09-28 (×8): 3 mL via RESPIRATORY_TRACT
  Filled 2015-09-25 (×9): qty 3

## 2015-09-25 NOTE — Discharge Summary (Addendum)
Physician Discharge Summary  Kathy Flores MRN: [id number] DOB/AGE: March 16, 1990 25 y.o.  PCP: No PCP Per Patient   Admit date: 09/23/2015 Discharge date: 09/25/2015  Discharge Diagnoses:    Principal Problem:   Acute respiratory failure with hypoxemia ([place]) Active Problems:   SIRS (systemic inflammatory response syndrome) (HCC)   Normochromic normocytic anemia   Asthma exacerbation   Metabolic acidosis    Follow-up recommendations Follow-up with PCP in 3-5 days , including all  additional recommended appointments as below Follow-up CBC, CMP in 3-5 days   Addendum:dc     Cancelled as pt still wheezing  With ambulation, dc prednisone, switched high dose steroids    Current Discharge Medication List    START taking these medications   Details  levofloxacin (LEVAQUIN) 750 MG tablet Take 1 tablet (750 mg total) by mouth daily. Qty: 5 tablet, Refills: 0    predniSONE (DELTASONE) 10 MG tablet 6 tablets 5 days, 5 tablets 5 days, 4 tablets 5 days, 3 tablets 5 days, 2 tablets 5 days, 1 tablet 5 days, half a tablet 5 days then discontinue Qty: 120 tablet, Refills: 0      CONTINUE these medications which have NOT CHANGED   Details  albuterol (PROVENTIL) (5 MG/ML) 0.5% nebulizer solution Take 0.5 mLs (2.5 mg total) by nebulization every 6 (six) hours as needed for wheezing or shortness of breath. Qty: 20 mL, Refills: 0    budesonide (PULMICORT) 180 MCG/ACT inhaler Inhale 1 puff into the lungs 2 (two) times daily. Qty: 1 Inhaler, Refills: 1    escitalopram (LEXAPRO) 10 MG tablet Take 10 mg by mouth every morning.         Discharge Condition: Stable   Discharge Instructions Get Medicines reviewed and adjusted: Please take all your medications with you for your next visit with your Primary MD  Please request your Primary MD to go over all hospital tests and procedure/radiological results at the follow up, please ask your Primary MD to get all Hospital records  sent to his/her office.  If you experience worsening of your admission symptoms, develop shortness of breath, life threatening emergency, suicidal or homicidal thoughts you must seek medical attention immediately by calling 911 or calling your MD immediately if symptoms less severe.  You must read complete instructions/literature along with all the possible adverse reactions/side effects for all the Medicines you take and that have been prescribed to you. Take any new Medicines after you have completely understood and accpet all the possible adverse reactions/side effects.   Do not drive when taking Pain medications.   Do not take more than prescribed Pain, Sleep and Anxiety Medications  Special Instructions: If you have smoked or chewed Tobacco in the last 2 yrs please stop smoking, stop any regular Alcohol and or any Recreational drug use.  Wear Seat belts while driving.  Please note  You were cared for by a hospitalist during your hospital stay. Once you are discharged, your primary care physician will handle any further medical issues. Please note that NO REFILLS for any discharge medications will be authorized once you are discharged, as it is imperative that you return to your primary care physician (or establish a relationship with a primary care physician if you do not have one) for your aftercare needs so that they can reassess your need for medications and monitor your lab values.  Discharge Instructions    Diet - low sodium heart healthy    Complete by:  As directed  Increase activity slowly    Complete by:  As directed        Allergies  Allergen Reactions  . Penicillins Hives and Rash    Has patient had a PCN reaction causing immediate rash, facial/tongue/throat swelling, SOB or lightheadedness with hypotension: Yes Has patient had a PCN reaction causing severe rash involving mucus membranes or skin necrosis: No Has patient had a PCN reaction that required  hospitalization No Has patient had a PCN reaction occurring within the last 10 years: Yes If all of the above answers are "NO", then may proceed with Cephalosporin use.       Disposition: 01-Home or Self Care   Consults:  None     Significant Diagnostic Studies:  Dg Chest 2 View  Result Date: 09/23/2015 CLINICAL DATA:  Patient with cough and shortness of breath. Mid sternal chest pain. EXAM: CHEST  2 VIEW COMPARISON:  Chest radiograph 08/20/2015 FINDINGS: Normal cardiac and mediastinal contours. No consolidative pulmonary opacities. No pleural effusion or pneumothorax. Regional skeleton is unremarkable. IMPRESSION: No active cardiopulmonary disease. Electronically Signed   By: Lovey Newcomer M.D.   On: 09/23/2015 09:12   Ct Angio Chest Pe W And/or Wo Contrast  Result Date: 09/23/2015 CLINICAL DATA:  25 year old female with severe sepsis, shortness of breath and chest pain. EXAM: CT ANGIOGRAPHY CHEST CT ABDOMEN AND PELVIS WITH CONTRAST TECHNIQUE: Multidetector CT imaging of the chest was performed using the standard protocol during bolus administration of intravenous contrast. Multiplanar CT image reconstructions and MIPs were obtained to evaluate the vascular anatomy. Multidetector CT imaging of the abdomen and pelvis was performed using the standard protocol during bolus administration of intravenous contrast. CONTRAST:  100 cc Isovue 370 IV. COMPARISON:  None. FINDINGS: CTA CHEST FINDINGS Cardiovascular: The study is moderate quality for the evaluation of pulmonary embolism, with limited evaluation of the subsegmental pulmonary arteries due to motion and limited contrast opacification. There are no filling defects in the central, lobar or segmental pulmonary artery branches to suggest acute pulmonary embolism. Normal course and caliber of the thoracic aorta. Top-normal caliber main pulmonary artery (3.2 cm diameter). Normal heart size. No significant pericardial fluid/thickening.  Mediastinum/Nodes: No discrete thyroid nodules. Unremarkable esophagus. No pathologically enlarged axillary, mediastinal or hilar lymph nodes. Lungs/Pleura: No pneumothorax. No pleural effusion. No acute consolidative airspace disease, lung masses or significant pulmonary nodules. There are mild patchy ground-glass opacities in the upper lungs, most prominent in the right upper lobe. Musculoskeletal:  No aggressive appearing focal osseous lesions. Review of the MIP images confirms the above findings. CT ABDOMEN and PELVIS FINDINGS Motion degraded study . Hepatobiliary: Normal liver with no liver mass. Normal gallbladder with no radiopaque cholelithiasis. No biliary ductal dilatation. Pancreas: Normal, with no mass or duct dilation. Spleen: Normal size. No mass. Adrenals/Urinary Tract: Normal adrenals. Normal kidneys with no hydronephrosis and no renal mass. Normal bladder. Stomach/Bowel: Grossly normal stomach. Normal caliber small bowel with no small bowel wall thickening. Normal appendix . Normal large bowel with no diverticulosis, large bowel wall thickening or pericolonic fat stranding. Vascular/Lymphatic: Normal caliber abdominal aorta. Patent portal, splenic, hepatic and renal veins. No pathologically enlarged lymph nodes in the abdomen or pelvis. Reproductive: Grossly normal uterus.  No adnexal mass. Other: No pneumoperitoneum, ascites or focal fluid collection. Musculoskeletal: No aggressive appearing focal osseous lesions. Review of the MIP images confirms the above findings. IMPRESSION: 1. No evidence of pulmonary embolism, with limitations as described . 2. Mild patchy ground-glass opacities in the upper lungs, with a broad  differential including atypical infection, vasculitis or other inflammatory etiologies. 3. No acute abnormality in the abdomen or pelvis. No evidence of bowel obstruction or acute bowel inflammation. Normal appendix. Electronically Signed   By: Ilona Sorrel M.D.   On: 09/23/2015 18:08    Ct Abdomen Pelvis W Contrast  Result Date: 09/23/2015 CLINICAL DATA:  25 year old female with severe sepsis, shortness of breath and chest pain. EXAM: CT ANGIOGRAPHY CHEST CT ABDOMEN AND PELVIS WITH CONTRAST TECHNIQUE: Multidetector CT imaging of the chest was performed using the standard protocol during bolus administration of intravenous contrast. Multiplanar CT image reconstructions and MIPs were obtained to evaluate the vascular anatomy. Multidetector CT imaging of the abdomen and pelvis was performed using the standard protocol during bolus administration of intravenous contrast. CONTRAST:  100 cc Isovue 370 IV. COMPARISON:  None. FINDINGS: CTA CHEST FINDINGS Cardiovascular: The study is moderate quality for the evaluation of pulmonary embolism, with limited evaluation of the subsegmental pulmonary arteries due to motion and limited contrast opacification. There are no filling defects in the central, lobar or segmental pulmonary artery branches to suggest acute pulmonary embolism. Normal course and caliber of the thoracic aorta. Top-normal caliber main pulmonary artery (3.2 cm diameter). Normal heart size. No significant pericardial fluid/thickening. Mediastinum/Nodes: No discrete thyroid nodules. Unremarkable esophagus. No pathologically enlarged axillary, mediastinal or hilar lymph nodes. Lungs/Pleura: No pneumothorax. No pleural effusion. No acute consolidative airspace disease, lung masses or significant pulmonary nodules. There are mild patchy ground-glass opacities in the upper lungs, most prominent in the right upper lobe. Musculoskeletal:  No aggressive appearing focal osseous lesions. Review of the MIP images confirms the above findings. CT ABDOMEN and PELVIS FINDINGS Motion degraded study . Hepatobiliary: Normal liver with no liver mass. Normal gallbladder with no radiopaque cholelithiasis. No biliary ductal dilatation. Pancreas: Normal, with no mass or duct dilation. Spleen: Normal size. No  mass. Adrenals/Urinary Tract: Normal adrenals. Normal kidneys with no hydronephrosis and no renal mass. Normal bladder. Stomach/Bowel: Grossly normal stomach. Normal caliber small bowel with no small bowel wall thickening. Normal appendix . Normal large bowel with no diverticulosis, large bowel wall thickening or pericolonic fat stranding. Vascular/Lymphatic: Normal caliber abdominal aorta. Patent portal, splenic, hepatic and renal veins. No pathologically enlarged lymph nodes in the abdomen or pelvis. Reproductive: Grossly normal uterus.  No adnexal mass. Other: No pneumoperitoneum, ascites or focal fluid collection. Musculoskeletal: No aggressive appearing focal osseous lesions. Review of the MIP images confirms the above findings. IMPRESSION: 1. No evidence of pulmonary embolism, with limitations as described . 2. Mild patchy ground-glass opacities in the upper lungs, with a broad differential including atypical infection, vasculitis or other inflammatory etiologies. 3. No acute abnormality in the abdomen or pelvis. No evidence of bowel obstruction or acute bowel inflammation. Normal appendix. Electronically Signed   By: Ilona Sorrel M.D.   On: 09/23/2015 18:08        Filed Weights   09/23/15 0826 09/23/15 2330  Weight: 90.7 kg (200 lb) 98.1 kg (216 lb 4.3 oz)     Microbiology: Recent Results (from the past 240 hour(s))  Urine culture     Status: Abnormal   Collection Time: 09/23/15  1:16 PM  Result Value Ref Range Status   Specimen Description URINE, RANDOM  Final   Special Requests NONE  Final   Culture MULTIPLE SPECIES PRESENT, SUGGEST RECOLLECTION (A)  Final   Report Status 09/24/2015 FINAL  Final  Blood Culture (routine x 2)     Status: Abnormal (Preliminary result)  Collection Time: 09/23/15  1:43 PM  Result Value Ref Range Status   Specimen Description BLOOD LEFT ANTECUBITAL  Final   Special Requests BOTTLES DRAWN AEROBIC AND ANAEROBIC  5CC  Final   Culture  Setup Time   Final     GRAM POSITIVE COCCI IN CLUSTERS AEROBIC BOTTLE ONLY CRITICAL RESULT CALLED TO, READ BACK BY AND VERIFIED WITH: N. Batchelder Pharm.D. 13:40 09/24/15  ([email or web address])    Culture STAPHYLOCOCCUS SPECIES (COAGULASE NEGATIVE) (A)  Final   Report Status PENDING  Incomplete  Blood Culture ID Panel (Reflexed)     Status: Abnormal   Collection Time: 09/23/15  1:43 PM  Result Value Ref Range Status   Enterococcus species NOT DETECTED NOT DETECTED Final   Listeria monocytogenes NOT DETECTED NOT DETECTED Final   Staphylococcus species DETECTED (A) NOT DETECTED Final    Comment: CRITICAL RESULT CALLED TO, READ BACK BY AND VERIFIED WITH: N. Batchelder Pharm.D. 13:40 09/24/15 ([email or web address])    Staphylococcus aureus NOT DETECTED NOT DETECTED Final   Methicillin resistance NOT DETECTED NOT DETECTED Final   Streptococcus species NOT DETECTED NOT DETECTED Final   Streptococcus agalactiae NOT DETECTED NOT DETECTED Final   Streptococcus pneumoniae NOT DETECTED NOT DETECTED Final   Streptococcus pyogenes NOT DETECTED NOT DETECTED Final   Acinetobacter baumannii NOT DETECTED NOT DETECTED Final   Enterobacteriaceae species NOT DETECTED NOT DETECTED Final   Enterobacter cloacae complex NOT DETECTED NOT DETECTED Final   Escherichia coli NOT DETECTED NOT DETECTED Final   Klebsiella oxytoca NOT DETECTED NOT DETECTED Final   Klebsiella pneumoniae NOT DETECTED NOT DETECTED Final   Proteus species NOT DETECTED NOT DETECTED Final   Serratia marcescens NOT DETECTED NOT DETECTED Final   Haemophilus influenzae NOT DETECTED NOT DETECTED Final   Neisseria meningitidis NOT DETECTED NOT DETECTED Final   Pseudomonas aeruginosa NOT DETECTED NOT DETECTED Final   Candida albicans NOT DETECTED NOT DETECTED Final   Candida glabrata NOT DETECTED NOT DETECTED Final   Candida krusei NOT DETECTED NOT DETECTED Final   Candida parapsilosis NOT DETECTED NOT DETECTED Final   Candida tropicalis NOT DETECTED NOT DETECTED Final  Blood Culture  (routine x 2)     Status: None (Preliminary result)   Collection Time: 09/23/15  1:48 PM  Result Value Ref Range Status   Specimen Description BLOOD RIGHT ANTECUBITAL  Final   Special Requests BOTTLES DRAWN AEROBIC AND ANAEROBIC  5CC  Final   Culture NO GROWTH 2 DAYS  Final   Report Status PENDING  Incomplete  Wet prep, genital     Status: Abnormal   Collection Time: 09/23/15  3:45 PM  Result Value Ref Range Status   Yeast Wet Prep HPF POC NONE SEEN NONE SEEN Final   Trich, Wet Prep PRESENT (A) NONE SEEN Final   Clue Cells Wet Prep HPF POC NONE SEEN NONE SEEN Final   WBC, Wet Prep HPF POC MANY (A) NONE SEEN Final   Sperm NONE SEEN  Final  MRSA PCR Screening     Status: None   Collection Time: 09/23/15 11:54 PM  Result Value Ref Range Status   MRSA by PCR NEGATIVE NEGATIVE Final    Comment:        The GeneXpert MRSA Assay (FDA approved for NASAL specimens only), is one component of a comprehensive MRSA colonization surveillance program. It is not intended to diagnose MRSA infection nor to guide or monitor treatment for MRSA infections.        Blood Culture    Component  Value Date/Time   SDES BLOOD RIGHT ANTECUBITAL 09/23/2015 1348   SPECREQUEST BOTTLES DRAWN AEROBIC AND ANAEROBIC  5CC 09/23/2015 1348   CULT NO GROWTH 2 DAYS 09/23/2015 1348   REPTSTATUS PENDING 09/23/2015 1348      Labs: Results for orders placed or performed during the hospital encounter of 09/23/15 (from the past 48 hour(s))  I-Stat Chem 8, ED     Status: Abnormal   Collection Time: 09/23/15 12:33 PM  Result Value Ref Range   Sodium 137 135 - 145 mmol/L   Potassium 2.9 (L) 3.5 - 5.1 mmol/L   Chloride 100 (L) 101 - 111 mmol/L   BUN 5 (L) 6 - 20 mg/dL   Creatinine, Ser 0.90 0.44 - 1.00 mg/dL   Glucose, Bld 205 (H) 65 - 99 mg/dL   Calcium, Ion 1.12 (L) 1.15 - 1.40 mmol/L   TCO2 22 0 - 100 mmol/L   Hemoglobin 10.9 (L) 12.0 - 15.0 g/dL   HCT 32.0 (L) 36.0 - 46.0 %  I-Stat Troponin, ED (not at  [facility])     Status: None   Collection Time: 09/23/15 12:46 PM  Result Value Ref Range   Troponin i, poc 0.01 0.00 - 0.08 ng/mL   Comment 3            Comment: Due to the release kinetics of cTnI, a negative result within the first hours of the onset of symptoms does not rule out myocardial infarction with certainty. If myocardial infarction is still suspected, repeat the test at appropriate intervals.   POC CBG, ED     Status: Abnormal   Collection Time: 09/23/15 12:49 PM  Result Value Ref Range   Glucose-Capillary 207 (H) 65 - 99 mg/dL  Urinalysis, Routine w reflex microscopic (not at [facility])     Status: Abnormal   Collection Time: 09/23/15  1:16 PM  Result Value Ref Range   Color, Urine YELLOW YELLOW   APPearance CLOUDY (A) CLEAR   Specific Gravity, Urine 1.002 (L) 1.005 - 1.030   pH 5.0 5.0 - 8.0   Glucose, UA 100 (A) NEGATIVE mg/dL   Hgb urine dipstick NEGATIVE NEGATIVE   Bilirubin Urine NEGATIVE NEGATIVE   Ketones, ur NEGATIVE NEGATIVE mg/dL   Protein, ur NEGATIVE NEGATIVE mg/dL   Nitrite NEGATIVE NEGATIVE   Leukocytes, UA LARGE (A) NEGATIVE  Urine culture     Status: Abnormal   Collection Time: 09/23/15  1:16 PM  Result Value Ref Range   Specimen Description URINE, RANDOM    Special Requests NONE    Culture MULTIPLE SPECIES PRESENT, SUGGEST RECOLLECTION (A)    Report Status 09/24/2015 FINAL   Urine microscopic-add on     Status: Abnormal   Collection Time: 09/23/15  1:16 PM  Result Value Ref Range   Squamous Epithelial / LPF 0-5 (A) NONE SEEN   WBC, UA 6-30 0 - 5 WBC/hpf   RBC / HPF 0-5 0 - 5 RBC/hpf   Bacteria, UA RARE (A) NONE SEEN  Blood Culture (routine x 2)     Status: Abnormal (Preliminary result)   Collection Time: 09/23/15  1:43 PM  Result Value Ref Range   Specimen Description BLOOD LEFT ANTECUBITAL    Special Requests BOTTLES DRAWN AEROBIC AND ANAEROBIC  5CC    Culture  Setup Time      GRAM POSITIVE COCCI IN CLUSTERS AEROBIC BOTTLE ONLY CRITICAL RESULT  CALLED TO, READ BACK BY AND VERIFIED WITH: N. Batchelder Pharm.D. 13:40 09/24/15  ([email or web address])    Culture  STAPHYLOCOCCUS SPECIES (COAGULASE NEGATIVE) (A)    Report Status PENDING   Blood Culture ID Panel (Reflexed)     Status: Abnormal   Collection Time: 09/23/15  1:43 PM  Result Value Ref Range   Enterococcus species NOT DETECTED NOT DETECTED   Listeria monocytogenes NOT DETECTED NOT DETECTED   Staphylococcus species DETECTED (A) NOT DETECTED    Comment: CRITICAL RESULT CALLED TO, READ BACK BY AND VERIFIED WITH: N. Batchelder Pharm.D. 13:40 09/24/15 ([email or web address])    Staphylococcus aureus NOT DETECTED NOT DETECTED   Methicillin resistance NOT DETECTED NOT DETECTED   Streptococcus species NOT DETECTED NOT DETECTED   Streptococcus agalactiae NOT DETECTED NOT DETECTED   Streptococcus pneumoniae NOT DETECTED NOT DETECTED   Streptococcus pyogenes NOT DETECTED NOT DETECTED   Acinetobacter baumannii NOT DETECTED NOT DETECTED   Enterobacteriaceae species NOT DETECTED NOT DETECTED   Enterobacter cloacae complex NOT DETECTED NOT DETECTED   Escherichia coli NOT DETECTED NOT DETECTED   Klebsiella oxytoca NOT DETECTED NOT DETECTED   Klebsiella pneumoniae NOT DETECTED NOT DETECTED   Proteus species NOT DETECTED NOT DETECTED   Serratia marcescens NOT DETECTED NOT DETECTED   Haemophilus influenzae NOT DETECTED NOT DETECTED   Neisseria meningitidis NOT DETECTED NOT DETECTED   Pseudomonas aeruginosa NOT DETECTED NOT DETECTED   Candida albicans NOT DETECTED NOT DETECTED   Candida glabrata NOT DETECTED NOT DETECTED   Candida krusei NOT DETECTED NOT DETECTED   Candida parapsilosis NOT DETECTED NOT DETECTED   Candida tropicalis NOT DETECTED NOT DETECTED  Blood Culture (routine x 2)     Status: None (Preliminary result)   Collection Time: 09/23/15  1:48 PM  Result Value Ref Range   Specimen Description BLOOD RIGHT ANTECUBITAL    Special Requests BOTTLES DRAWN AEROBIC AND ANAEROBIC  5CC    Culture NO GROWTH 2  DAYS    Report Status PENDING   I-Stat Beta hCG blood, ED ([facility], [facility], [facility] only)     Status: None   Collection Time: 09/23/15  1:50 PM  Result Value Ref Range   I-stat hCG, quantitative <5.0 <5 mIU/mL   Comment 3            Comment:   GEST. AGE      CONC.  (mIU/mL)   <=1 WEEK        5 - 50     2 WEEKS       50 - 500     3 WEEKS       100 - 10,000     4 WEEKS     1,000 - 30,000        FEMALE AND NON-PREGNANT FEMALE:     LESS THAN 5 mIU/mL   I-Stat CG4 Lactic Acid, ED  (not at  [facility])     Status: Abnormal   Collection Time: 09/23/15  1:53 PM  Result Value Ref Range   Lactic Acid, Venous 8.13 (HH) 0.5 - 1.9 mmol/L   Comment NOTIFIED PHYSICIAN   Wet prep, genital     Status: Abnormal   Collection Time: 09/23/15  3:45 PM  Result Value Ref Range   Yeast Wet Prep HPF POC NONE SEEN NONE SEEN   Trich, Wet Prep PRESENT (A) NONE SEEN   Clue Cells Wet Prep HPF POC NONE SEEN NONE SEEN   WBC, Wet Prep HPF POC MANY (A) NONE SEEN   Sperm NONE SEEN   I-Stat CG4 Lactic Acid, ED     Status: Abnormal   Collection Time: 09/23/15  4:39 PM  Result Value Ref Range   Lactic Acid, Venous 8.45 (HH) 0.5 - 1.9 mmol/L   Comment NOTIFIED PHYSICIAN   I-Stat Troponin, ED (not at [facility])     Status: None   Collection Time: 09/23/15  4:51 PM  Result Value Ref Range   Troponin i, poc 0.07 0.00 - 0.08 ng/mL   Comment 3            Comment: Due to the release kinetics of cTnI, a negative result within the first hours of the onset of symptoms does not rule out myocardial infarction with certainty. If myocardial infarction is still suspected, repeat the test at appropriate intervals.   I-Stat CG4 Lactic Acid, ED     Status: Abnormal   Collection Time: 09/23/15  5:49 PM  Result Value Ref Range   Lactic Acid, Venous 6.71 (HH) 0.5 - 1.9 mmol/L   Comment MD NOTIFIED, REPEAT TEST   I-Stat Arterial Blood Gas, ED - (order at [facility] and [facility] only)     Status: Abnormal   Collection Time: 09/23/15  5:50 PM  Result Value Ref Range    pH, Arterial 7.262 (L) 7.350 - 7.450   pCO2 arterial 25.4 (L) 32.0 - 48.0 mmHg   pO2, Arterial 84.0 83.0 - 108.0 mmHg   Bicarbonate 11.4 (L) 20.0 - 28.0 mmol/L   TCO2 12 0 - 100 mmol/L   O2 Saturation 95.0 %   Acid-base deficit 14.0 (H) 0.0 - 2.0 mmol/L   Patient temperature 98.6 F    Collection site RADIAL, ALLEN'S TEST ACCEPTABLE    Drawn by Operator    Sample type ARTERIAL   Lactic acid, plasma     Status: Abnormal   Collection Time: 09/23/15  9:32 PM  Result Value Ref Range   Lactic Acid, Venous 4.2 (HH) 0.5 - 1.9 mmol/L    Comment: CRITICAL RESULT CALLED TO, READ BACK BY AND VERIFIED WITH: CHRISCO C,RN 09/23/15 2222 WAYK   Procalcitonin     Status: None   Collection Time: 09/23/15  9:32 PM  Result Value Ref Range   Procalcitonin 0.23 ng/mL    Comment:        Interpretation: PCT (Procalcitonin) <= 0.5 ng/mL: Systemic infection (sepsis) is not likely. Local bacterial infection is possible. (NOTE)         ICU PCT Algorithm               Non ICU PCT Algorithm    ----------------------------     ------------------------------         PCT < 0.25 ng/mL                 PCT < 0.1 ng/mL     Stopping of antibiotics            Stopping of antibiotics       strongly encouraged.               strongly encouraged.    ----------------------------     ------------------------------       PCT level decrease by               PCT < 0.25 ng/mL       >= 80% from peak PCT       OR PCT 0.25 - 0.5 ng/mL          Stopping of antibiotics  encouraged.     Stopping of antibiotics           encouraged.    ----------------------------     ------------------------------       PCT level decrease by              PCT >= 0.25 ng/mL       < 80% from peak PCT        AND PCT >= 0.5 ng/mL            Continuin g antibiotics                                              encouraged.       Continuing antibiotics            encouraged.     ----------------------------     ------------------------------     PCT level increase compared          PCT > 0.5 ng/mL         with peak PCT AND          PCT >= 0.5 ng/mL             Escalation of antibiotics                                          strongly encouraged.      Escalation of antibiotics        strongly encouraged.   CBC     Status: Abnormal   Collection Time: 09/23/15  9:32 PM  Result Value Ref Range   WBC 11.9 (H) 4.0 - 10.5 K/uL   RBC 3.55 (L) 3.87 - 5.11 MIL/uL   Hemoglobin 8.9 (L) 12.0 - 15.0 g/dL   HCT 82.1 (L) 47.7 - 26.7 %   MCV 82.0 78.0 - 100.0 fL   MCH 25.1 (L) 26.0 - 34.0 pg   MCHC 30.6 30.0 - 36.0 g/dL   RDW 71.3 70.7 - 40.8 %   Platelets 336 150 - 400 K/uL  Creatinine, serum     Status: None   Collection Time: 09/23/15  9:32 PM  Result Value Ref Range   Creatinine, Ser 0.95 0.44 - 1.00 mg/dL   GFR calc non Af Amer >60 >60 mL/min   GFR calc Af Amer >60 >60 mL/min    Comment: (NOTE) The eGFR has been calculated using the CKD EPI equation. This calculation has not been validated in all clinical situations. eGFR's persistently <60 mL/min signify possible Chronic Kidney Disease.   Magnesium     Status: None   Collection Time: 09/23/15  9:32 PM  Result Value Ref Range   Magnesium 2.0 1.7 - 2.4 mg/dL  I-Stat CG4 Lactic Acid, ED     Status: Abnormal   Collection Time: 09/23/15  9:40 PM  Result Value Ref Range   Lactic Acid, Venous 4.26 (HH) 0.5 - 1.9 mmol/L   Comment NOTIFIED PHYSICIAN   I-Stat venous blood gas, ED     Status: Abnormal   Collection Time: 09/23/15  9:54 PM  Result Value Ref Range   pH, Ven 7.343 7.250 - 7.430   pCO2, Ven 33.1 (L) 44.0 - 60.0 mmHg   pO2, Ven 101.0 (H) 32.0 - 45.0 mmHg   Bicarbonate  18.0 (L) 20.0 - 28.0 mmol/L   TCO2 19 0 - 100 mmol/L   O2 Saturation 98.0 %   Acid-base deficit 7.0 (H) 0.0 - 2.0 mmol/L   Patient temperature HIDE    Sample type VENOUS   MRSA PCR Screening     Status: None   Collection Time:  09/23/15 11:54 PM  Result Value Ref Range   MRSA by PCR NEGATIVE NEGATIVE    Comment:        The GeneXpert MRSA Assay (FDA approved for NASAL specimens only), is one component of a comprehensive MRSA colonization surveillance program. It is not intended to diagnose MRSA infection nor to guide or monitor treatment for MRSA infections.   CBC with Differential     Status: Abnormal   Collection Time: 09/24/15  1:59 AM  Result Value Ref Range   WBC 11.7 (H) 4.0 - 10.5 K/uL   RBC 3.34 (L) 3.87 - 5.11 MIL/uL   Hemoglobin 8.2 (L) 12.0 - 15.0 g/dL   HCT 27.3 (L) 36.0 - 46.0 %   MCV 81.7 78.0 - 100.0 fL   MCH 24.6 (L) 26.0 - 34.0 pg   MCHC 30.0 30.0 - 36.0 g/dL   RDW 15.0 11.5 - 15.5 %   Platelets 328 150 - 400 K/uL   Neutrophils Relative % 92 %   Neutro Abs 10.8 (H) 1.7 - 7.7 K/uL   Lymphocytes Relative 5 %   Lymphs Abs 0.6 (L) 0.7 - 4.0 K/uL   Monocytes Relative 3 %   Monocytes Absolute 0.3 0.1 - 1.0 K/uL   Eosinophils Relative 0 %   Eosinophils Absolute 0.0 0.0 - 0.7 K/uL   Basophils Relative 0 %   Basophils Absolute 0.0 0.0 - 0.1 K/uL  Comprehensive metabolic panel     Status: Abnormal   Collection Time: 09/24/15  1:59 AM  Result Value Ref Range   Sodium 136 135 - 145 mmol/L   Potassium 4.5 3.5 - 5.1 mmol/L    Comment: DELTA CHECK NOTED   Chloride 113 (H) 101 - 111 mmol/L   CO2 16 (L) 22 - 32 mmol/L   Glucose, Bld 119 (H) 65 - 99 mg/dL   BUN <5 (L) 6 - 20 mg/dL   Creatinine, Ser 0.77 0.44 - 1.00 mg/dL   Calcium 7.7 (L) 8.9 - 10.3 mg/dL   Total Protein 6.7 6.5 - 8.1 g/dL   Albumin 3.1 (L) 3.5 - 5.0 g/dL   AST 14 (L) 15 - 41 U/L   ALT 13 (L) 14 - 54 U/L   Alkaline Phosphatase 41 38 - 126 U/L   Total Bilirubin 0.1 (L) 0.3 - 1.2 mg/dL   GFR calc non Af Amer >60 >60 mL/min   GFR calc Af Amer >60 >60 mL/min    Comment: (NOTE) The eGFR has been calculated using the CKD EPI equation. This calculation has not been validated in all clinical situations. eGFR's persistently  <60 mL/min signify possible Chronic Kidney Disease.    Anion gap 7 5 - 15  Lactic acid, plasma     Status: None   Collection Time: 09/24/15  1:59 AM  Result Value Ref Range   Lactic Acid, Venous 1.7 0.5 - 1.9 mmol/L     Lipid Panel  No results found for: CHOL, TRIG, HDL, CHOLHDL, VLDL, LDLCALC, LDLDIRECT   No results found for: HGBA1C   Lab Results  Component Value Date   CREATININE 0.77 09/24/2015     HPI :  74 yr F who  presented to the ED with shortness of breath, wheezing, and lightheadedness. She reports symptoms started a few days ago with URI like symptoms (cough, congestion, rhinitis), and provoked an asthma attack. She has mild intermittent asthma at baseline and only uses a rescue inhaler. She had difficulty breathing last night and ran out of albuterol, so she presented to the ED this morning. She also complained of some mild lower abdominal cramping. In the ED, she was given albuterol, steroids and a pelvic exam. She did have trichomonas and was given a dose of iv flagyl. Reportedly she had low blood pressures on admission. A lactate was drawn, which was elevated. She was given several fluid boluses and additional antibiotics out of concern for sepsis. Her blood pressures recovered, but her lactate remained elevated and was consistent with a blood gas that showed pH 7.26, pCO2 25.4,pO2 84 on RA.    HOSPITAL COURSE:   1. Acute respiratory failure with hypoxemia secondary to asthma exacerbation-continue prednisone taper, status post IV Solu-Medrol nebulizer . Patient also received Pulmicort. and nebulizer treatments. Patient was given broad-spectrum antibiotics yesterday due to elevated lactate.   now on Levaquin, will continue for 5 more days  . BCID positive for staph species, likely contaminant.  respiratory viral panel pending. Repeat VBG showed improvement. Currently 99% on room air 2. SIRS/Metabolic acidosis with elevated lactate- appreciate pulmonary critical care  consult. Lactate elevated due to nebulizer use and shortness of breath as per pulmonary critical care. Treated with IV hydration  3. Trichomonas-  given 2 g of Flagyl 4. Normocytic normochromic anemia- follow CBC. Patient needs outpatient workup for her anemia  Discharge Exam:  Blood pressure 113/75, pulse 90, temperature 98.1 F (36.7 C), temperature source Oral, resp. rate 16, height '5\' 1"'$  (1.549 m), weight 98.1 kg (216 lb 4.3 oz), SpO2 99 %, unknown if currently breastfeeding.   General exam: Appears calm and comfortable  Respiratory system: Clear to auscultation. Respiratory effort normal. Cardiovascular system: S1 & S2 heard, RRR. No JVD, murmurs, rubs, gallops or clicks. No pedal edema. Gastrointestinal system: Abdomen is nondistended, soft and nontender. No organomegaly or masses felt. Normal bowel sounds heard. Central nervous system: Alert and oriented. No focal neurological deficits. Extremities: Symmetric 5 x 5 power. Skin: No rashes, lesions or ulcers Psychiatry: Judgement and insight appear normal. Mood & affect appropriate.    Follow-up Information    Primary care provider. Schedule an appointment as soon as possible for a visit in 2 day(s).   Why:  Hospital follow-up          Signed: Reyne Dumas 09/25/2015, 12:31 PM        Time spent >45 mins

## 2015-09-25 NOTE — Progress Notes (Signed)
Pt is positive for rhinovirus/enterovirus.

## 2015-09-25 NOTE — Plan of Care (Signed)
Problem: Physical Regulation: Goal: Ability to maintain clinical measurements within normal limits will improve Outcome: Progressing Patient states she is feeling somewhat better but still experiencing dyspnea on exertion. Continuing antibiotics and has respiratory virus panel pending.

## 2015-09-26 DIAGNOSIS — J9601 Acute respiratory failure with hypoxia: Secondary | ICD-10-CM

## 2015-09-26 LAB — CULTURE, BLOOD (ROUTINE X 2)

## 2015-09-26 NOTE — Progress Notes (Signed)
Pt transferred to 5W36. Vital signs stable. No reports of pain. Pt taken off telemetry to med-surg level of care. CCMD notified.

## 2015-09-26 NOTE — Progress Notes (Signed)
Pharmacy Antibiotic Note  Kathy Flores is a 25 y.o. female admitted on 09/23/2015 with pneumonia/asthma exacerbation.  Pharmacy has been consulted for Levaquin dosing.  Patient's renal function is stable.   Plan: - Continue Levaquin 750mg  Q24H, change to PO at discharge ?today - Pharmacy will sign off as dosage adjustment is likely unnecessary.  Thank you for the consult!  Height: 5\' 1"  (154.9 cm) Weight: 216 lb 4.3 oz (98.1 kg) IBW/kg (Calculated) : 47.8  Temp (24hrs), Avg:98.7 F (37.1 C), Min:98.1 F (36.7 C), Max:99.3 F (37.4 C)   Recent Labs Lab 09/23/15 1214 09/23/15 1233  09/23/15 1639 09/23/15 1749 09/23/15 2132 09/23/15 2140 09/24/15 0159  WBC 14.6*  --   --   --   --  11.9*  --  11.7*  CREATININE 1.02* 0.90  --   --   --  0.95  --  0.77  LATICACIDVEN  --   --   < > 8.45* 6.71* 4.2* 4.26* 1.7  < > = values in this interval not displayed.  Estimated Creatinine Clearance: 115.2 mL/min (by C-G formula based on SCr of 0.77 mg/dL).    Allergies  Allergen Reactions  . Penicillins Hives and Rash    Has patient had a PCN reaction causing immediate rash, facial/tongue/throat swelling, SOB or lightheadedness with hypotension: Yes Has patient had a PCN reaction causing severe rash involving mucus membranes or skin necrosis: No Has patient had a PCN reaction that required hospitalization No Has patient had a PCN reaction occurring within the last 10 years: Yes If all of the above answers are "NO", then may proceed with Cephalosporin use.     Antimicrobials this admission: Vanc 9/17 >> 9/17 Cefepime 9/17 >> 9/17 Flagyl 9/17 >> 9/18 LVQ 9/17 >>  Dose adjustments this admission: N/A   Microbiology results: 9/17 BCx - CoNS 1 of 2 (BCID Staph spp) 9/17 UCx - negative 9/17 MRSA PCR - negative 9/17 wet prep - trichomonas 9/19 RVP - Rhinovirus/Enterovirus   Kiara Mcdowell D. Mina Marble, PharmD, BCPS Pager:  [phone] 09/26/2015, 9:13 AM

## 2015-09-26 NOTE — Progress Notes (Signed)
PROGRESS NOTE  Kathy Flores [id number] DOB: 11/12/1990 DOA: 09/23/2015 PCP: No PCP Per Patient   LOS: 3 days   Brief Narrative: 25 y.o. female with asthma presents to the ER because of worsening shortness of breath over the last 24 hours. Patient started having upper respiratory tract infection symptoms yesterday and gradually started having productive cough with wheezing and shortness of breath. Planned for d/c home 9/19 however wheezing worsened.  Assessment & Plan: Principal Problem:   Acute respiratory failure with hypoxemia (HCC) Active Problems:   SIRS (systemic inflammatory response syndrome) (HCC)   Normochromic normocytic anemia   Asthma exacerbation   Metabolic acidosis  Acute respiratory failure with hypoxemia secondary to asthma exacerbation due to rhinovirus - still wheezing this morning, continue Duoneb, Pulmicort, IV steroids, empiric Levaquin - transfer to MedSurg - blood cultures with CoagNegative Staph 1/2 bottles, likely contaminant - hypoxemia improved, now on room air  SIRS/Metabolic acidosis with elevated lactate - lactate with significant elevation on admission to 8.4, now within normal limits  Trichomonas - given 2 g of Flagyl  Normocytic normochromic anemia - follow CBC. Patient needs outpatient workup for her anemia  DVT prophylaxis: Lovenox Code Status: Full Family Communication: no family bedside Disposition Plan: home 1-2 days  Consultants:   PCCM  Procedures:   None   Antimicrobials:  Levaquin 9/18 >>   Subjective: - no chest pain, no abdominal pain, nausea or vomiting.  - +shortness of breath and weakness with minimal ambulation  Objective: Vitals:   09/26/15 0856 09/26/15 0859 09/26/15 0903 09/26/15 1157  BP:    (!) 157/92  Pulse:    95  Resp:    17  Temp:    99 F (37.2 C)  TempSrc:    Oral  SpO2: 96% 98% 100% 98%  Weight:      Height:        Intake/Output Summary (Last 24 hours) at 09/26/15 1216 Last  data filed at 09/25/15 2203  Gross per 24 hour  Intake              150 ml  Output                0 ml  Net              150 ml   Filed Weights   09/23/15 0826 09/23/15 2330  Weight: 90.7 kg (200 lb) 98.1 kg (216 lb 4.3 oz)    Examination: Constitutional: NAD Vitals:   09/26/15 0856 09/26/15 0859 09/26/15 0903 09/26/15 1157  BP:    (!) 157/92  Pulse:    95  Resp:    17  Temp:    99 F (37.2 C)  TempSrc:    Oral  SpO2: 96% 98% 100% 98%  Weight:      Height:       Eyes: PERRL, lids and conjunctivae normal ENMT: Mucous membranes are moist. No oropharyngeal exudates Respiratory: diffuse bilateral wheezing, no crackles, moves air well Cardiovascular: Regular rate and rhythm, no murmurs / rubs / gallops. No LE edema. 2+ pedal pulses. No carotid bruits.  Abdomen: no tenderness. Bowel sounds positive.  Skin: no rashes  Data Reviewed: I have personally reviewed following labs and imaging studies  CBC:  Recent Labs Lab 09/23/15 1214 09/23/15 1233 09/23/15 2132 09/24/15 0159  WBC 14.6*  --  11.9* 11.7*  NEUTROABS 13.3*  --   --  10.8*  HGB 9.6* 10.9* 8.9* 8.2*  HCT 31.9* 32.0* 29.1* 27.3*  MCV 83.1  --  82.0 81.7  PLT 245  --  336 [id number]   Basic Metabolic Panel:  Recent Labs Lab 09/23/15 1214 09/23/15 1233 09/23/15 2132 09/24/15 0159  NA 134* 137  --  136  K 3.1* 2.9*  --  4.5  CL 103 100*  --  113*  CO2 19*  --   --  16*  GLUCOSE 195* 205*  --  119*  BUN 6 5*  --  <5*  CREATININE 1.02* 0.90 0.95 0.77  CALCIUM 8.8*  --   --  7.7*  MG  --   --  2.0  --    GFR: Estimated Creatinine Clearance: 115.2 mL/min (by C-G formula based on SCr of 0.77 mg/dL). Liver Function Tests:  Recent Labs Lab 09/24/15 0159  AST 14*  ALT 13*  ALKPHOS 41  BILITOT 0.1*  PROT 6.7  ALBUMIN 3.1*   No results for input(s): LIPASE, AMYLASE in the last 168 hours. No results for input(s): AMMONIA in the last 168 hours. Coagulation Profile: No results for input(s): INR, PROTIME in  the last 168 hours. Cardiac Enzymes: No results for input(s): CKTOTAL, CKMB, CKMBINDEX, TROPONINI in the last 168 hours. BNP (last 3 results) No results for input(s): PROBNP in the last 8760 hours. HbA1C: No results for input(s): HGBA1C in the last 72 hours. CBG:  Recent Labs Lab 09/23/15 1249  GLUCAP 207*   Lipid Profile: No results for input(s): CHOL, HDL, LDLCALC, TRIG, CHOLHDL, LDLDIRECT in the last 72 hours. Thyroid Function Tests: No results for input(s): TSH, T4TOTAL, FREET4, T3FREE, THYROIDAB in the last 72 hours. Anemia Panel: No results for input(s): VITAMINB12, FOLATE, FERRITIN, TIBC, IRON, RETICCTPCT in the last 72 hours. Urine analysis:    Component Value Date/Time   COLORURINE YELLOW 09/23/2015 1316   APPEARANCEUR CLOUDY (A) 09/23/2015 1316   LABSPEC 1.002 (L) 09/23/2015 1316   PHURINE 5.0 09/23/2015 1316   GLUCOSEU 100 (A) 09/23/2015 1316   HGBUR NEGATIVE 09/23/2015 1316   BILIRUBINUR NEGATIVE 09/23/2015 1316   KETONESUR NEGATIVE 09/23/2015 1316   PROTEINUR NEGATIVE 09/23/2015 1316   UROBILINOGEN 0.2 08/04/2015 1728   NITRITE NEGATIVE 09/23/2015 1316   LEUKOCYTESUR LARGE (A) 09/23/2015 1316   Sepsis Labs: Invalid input(s): PROCALCITONIN, LACTICIDVEN  Recent Results (from the past 240 hour(s))  Urine culture     Status: Abnormal   Collection Time: 09/23/15  1:16 PM  Result Value Ref Range Status   Specimen Description URINE, RANDOM  Final   Special Requests NONE  Final   Culture MULTIPLE SPECIES PRESENT, SUGGEST RECOLLECTION (A)  Final   Report Status 09/24/2015 FINAL  Final  Blood Culture (routine x 2)     Status: Abnormal   Collection Time: 09/23/15  1:43 PM  Result Value Ref Range Status   Specimen Description BLOOD LEFT ANTECUBITAL  Final   Special Requests BOTTLES DRAWN AEROBIC AND ANAEROBIC  5CC  Final   Culture  Setup Time   Final    GRAM POSITIVE COCCI IN CLUSTERS AEROBIC BOTTLE ONLY CRITICAL RESULT CALLED TO, READ BACK BY AND VERIFIED  WITH: N. Batchelder Pharm.D. 13:40 09/24/15  ([email or web address])    Culture (A)  Final    STAPHYLOCOCCUS SPECIES (COAGULASE NEGATIVE) THE SIGNIFICANCE OF ISOLATING THIS ORGANISM FROM A SINGLE SET OF BLOOD CULTURES WHEN MULTIPLE SETS ARE DRAWN IS UNCERTAIN. PLEASE NOTIFY THE MICROBIOLOGY DEPARTMENT WITHIN ONE WEEK IF SPECIATION AND SENSITIVITIES ARE REQUIRED.    Report Status 09/26/2015 FINAL  Final  Blood Culture ID Panel (Reflexed)  Status: Abnormal   Collection Time: 09/23/15  1:43 PM  Result Value Ref Range Status   Enterococcus species NOT DETECTED NOT DETECTED Final   Listeria monocytogenes NOT DETECTED NOT DETECTED Final   Staphylococcus species DETECTED (A) NOT DETECTED Final    Comment: CRITICAL RESULT CALLED TO, READ BACK BY AND VERIFIED WITH: N. Batchelder Pharm.D. 13:40 09/24/15 ([email or web address])    Staphylococcus aureus NOT DETECTED NOT DETECTED Final   Methicillin resistance NOT DETECTED NOT DETECTED Final   Streptococcus species NOT DETECTED NOT DETECTED Final   Streptococcus agalactiae NOT DETECTED NOT DETECTED Final   Streptococcus pneumoniae NOT DETECTED NOT DETECTED Final   Streptococcus pyogenes NOT DETECTED NOT DETECTED Final   Acinetobacter baumannii NOT DETECTED NOT DETECTED Final   Enterobacteriaceae species NOT DETECTED NOT DETECTED Final   Enterobacter cloacae complex NOT DETECTED NOT DETECTED Final   Escherichia coli NOT DETECTED NOT DETECTED Final   Klebsiella oxytoca NOT DETECTED NOT DETECTED Final   Klebsiella pneumoniae NOT DETECTED NOT DETECTED Final   Proteus species NOT DETECTED NOT DETECTED Final   Serratia marcescens NOT DETECTED NOT DETECTED Final   Haemophilus influenzae NOT DETECTED NOT DETECTED Final   Neisseria meningitidis NOT DETECTED NOT DETECTED Final   Pseudomonas aeruginosa NOT DETECTED NOT DETECTED Final   Candida albicans NOT DETECTED NOT DETECTED Final   Candida glabrata NOT DETECTED NOT DETECTED Final   Candida krusei NOT DETECTED NOT DETECTED Final    Candida parapsilosis NOT DETECTED NOT DETECTED Final   Candida tropicalis NOT DETECTED NOT DETECTED Final  Blood Culture (routine x 2)     Status: None (Preliminary result)   Collection Time: 09/23/15  1:48 PM  Result Value Ref Range Status   Specimen Description BLOOD RIGHT ANTECUBITAL  Final   Special Requests BOTTLES DRAWN AEROBIC AND ANAEROBIC  5CC  Final   Culture NO GROWTH 3 DAYS  Final   Report Status PENDING  Incomplete  Wet prep, genital     Status: Abnormal   Collection Time: 09/23/15  3:45 PM  Result Value Ref Range Status   Yeast Wet Prep HPF POC NONE SEEN NONE SEEN Final   Trich, Wet Prep PRESENT (A) NONE SEEN Final   Clue Cells Wet Prep HPF POC NONE SEEN NONE SEEN Final   WBC, Wet Prep HPF POC MANY (A) NONE SEEN Final   Sperm NONE SEEN  Final  MRSA PCR Screening     Status: None   Collection Time: 09/23/15 11:54 PM  Result Value Ref Range Status   MRSA by PCR NEGATIVE NEGATIVE Final    Comment:        The GeneXpert MRSA Assay (FDA approved for NASAL specimens only), is one component of a comprehensive MRSA colonization surveillance program. It is not intended to diagnose MRSA infection nor to guide or monitor treatment for MRSA infections.   Respiratory Panel by PCR     Status: Abnormal   Collection Time: 09/25/15  1:00 AM  Result Value Ref Range Status   Adenovirus NOT DETECTED NOT DETECTED Final   Coronavirus 229E NOT DETECTED NOT DETECTED Final   Coronavirus HKU1 NOT DETECTED NOT DETECTED Final   Coronavirus NL63 NOT DETECTED NOT DETECTED Final   Coronavirus OC43 NOT DETECTED NOT DETECTED Final   Metapneumovirus NOT DETECTED NOT DETECTED Final   Rhinovirus / Enterovirus DETECTED (A) NOT DETECTED Final   Influenza A NOT DETECTED NOT DETECTED Final   Influenza B NOT DETECTED NOT DETECTED Final   Parainfluenza Virus 1 NOT DETECTED  NOT DETECTED Final   Parainfluenza Virus 2 NOT DETECTED NOT DETECTED Final   Parainfluenza Virus 3 NOT DETECTED NOT DETECTED  Final   Parainfluenza Virus 4 NOT DETECTED NOT DETECTED Final   Respiratory Syncytial Virus NOT DETECTED NOT DETECTED Final   Bordetella pertussis NOT DETECTED NOT DETECTED Final   Chlamydophila pneumoniae NOT DETECTED NOT DETECTED Final   Mycoplasma pneumoniae NOT DETECTED NOT DETECTED Final      Radiology Studies: No results found.   Scheduled Meds: . budesonide (PULMICORT) nebulizer solution  0.25 mg Nebulization BID  . enoxaparin (LOVENOX) injection  40 mg Subcutaneous QHS  . escitalopram  10 mg Oral q morning - 10a  . ipratropium-albuterol  3 mL Nebulization TID  . levofloxacin (LEVAQUIN) IV  750 mg Intravenous Q24H  . methylPREDNISolone (SOLU-MEDROL) injection  125 mg Intravenous Q12H  . mometasone-formoterol  2 puff Inhalation BID   Continuous Infusions:    Marzetta Board, MD, PhD [facility] Pager [phone]  If 7PM-7AM, please contact night-coverage [email or web address] Password [email or web address] 09/26/2015, 12:16 PM

## 2015-09-27 MED ORDER — BUDESONIDE 0.25 MG/2ML IN SUSP
0.2500 mg | Freq: Two times a day (BID) | RESPIRATORY_TRACT | Status: DC
  Administered 2015-09-27 – 2015-09-28 (×3): 0.25 mg via RESPIRATORY_TRACT
  Filled 2015-09-27 (×3): qty 2

## 2015-09-27 MED ORDER — LEVOFLOXACIN 750 MG PO TABS
750.0000 mg | ORAL_TABLET | Freq: Every day | ORAL | Status: DC
  Administered 2015-09-27: 750 mg via ORAL
  Filled 2015-09-27: qty 1

## 2015-09-27 NOTE — Progress Notes (Signed)
PROGRESS NOTE  Kathy Flores [id number] DOB: 23-Feb-1990 DOA: 09/23/2015 PCP: No PCP Per Patient   LOS: 4 days   Brief Narrative: 25 y.o. female with asthma presents to the ER because of worsening shortness of breath over the last 24 hours. Patient started having upper respiratory tract infection symptoms yesterday and gradually started having productive cough with wheezing and shortness of breath. Planned for d/c home 9/19 however wheezing worsened.  Assessment & Plan: Principal Problem:   Acute respiratory failure with hypoxemia (HCC) Active Problems:   SIRS (systemic inflammatory response syndrome) (HCC)   Normochromic normocytic anemia   Asthma exacerbation   Metabolic acidosis  Acute respiratory failure with hypoxemia secondary to asthma exacerbation due to rhinovirus - still wheezing this morning, continue Duoneb, Pulmicort, IV steroids, empiric Levaquin - transfer to Med[facility] - blood cultures with CoagNegative Staph 1/2 bottles, likely contaminant - hypoxemia improved, now on room air - encourage ambulation, anticipate d/c [id number]  SIRS/Metabolic acidosis with elevated lactate - lactate with significant elevation on admission to 8.4, now within normal limits  Trichomonas - given 2 g of Flagyl  Normocytic normochromic anemia - follow CBC. Patient needs outpatient workup for her anemia   DVT prophylaxis: Lovenox Code Status: Full Family Communication: no family bedside Disposition Plan: home 1-2 days  Consultants:   PCCM  Procedures:   None   Antimicrobials:  Levaquin 9/18 >>   Subjective: - no chest pain, no abdominal pain, nausea or vomiting.  - +shortness of breath and weakness with minimal ambulation but overall she is improving  Objective: Vitals:   09/26/15 2224 09/27/15 0537 09/27/15 0822 09/27/15 0842  BP: 130/74 128/82    Pulse: 87 (!) 58 68 68  Resp: 18 18 18 18   Temp: 98.6 F (37 C) 98.3 F (36.8 C)    TempSrc: Oral Oral      SpO2: 99% 99% 100% 100%  Weight:      Height:       No intake or output data in the 24 hours ending 09/27/15 1116 Filed Weights   09/23/15 0826 09/23/15 2330  Weight: 90.7 kg (200 lb) 98.1 kg (216 lb 4.3 oz)    Examination: Constitutional: NAD Vitals:   09/26/15 2224 09/27/15 0537 09/27/15 0822 09/27/15 0842  BP: 130/74 128/82    Pulse: 87 (!) 58 68 68  Resp: 18 18 18 18   Temp: 98.6 F (37 C) 98.3 F (36.8 C)    TempSrc: Oral Oral    SpO2: 99% 99% 100% 100%  Weight:      Height:       Eyes: PERRL, lids and conjunctivae normal ENMT: Mucous membranes are moist. No oropharyngeal exudates Respiratory: diffuse bilateral wheezing, no crackles, moves air well Cardiovascular: Regular rate and rhythm, no murmurs / rubs / gallops. No LE edema. 2+ pedal pulses. No carotid bruits.  Abdomen: no tenderness. Bowel sounds positive.  Skin: no rashes  Data Reviewed: I have personally reviewed following labs and imaging studies  CBC:  Recent Labs Lab 09/23/15 1214 09/23/15 1233 09/23/15 2132 09/24/15 0159  WBC 14.6*  --  11.9* 11.7*  NEUTROABS 13.3*  --   --  10.8*  HGB 9.6* 10.9* 8.9* 8.2*  HCT 31.9* 32.0* 29.1* 27.3*  MCV 83.1  --  82.0 81.7  PLT 245  --  336 [id number]   Basic Metabolic Panel:  Recent Labs Lab 09/23/15 1214 09/23/15 1233 09/23/15 2132 09/24/15 0159  NA 134* 137  --  136  K 3.1* 2.9*  --  4.5  CL 103 100*  --  113*  CO2 19*  --   --  16*  GLUCOSE 195* 205*  --  119*  BUN 6 5*  --  <5*  CREATININE 1.02* 0.90 0.95 0.77  CALCIUM 8.8*  --   --  7.7*  MG  --   --  2.0  --    GFR: Estimated Creatinine Clearance: 115.2 mL/min (by C-G formula based on SCr of 0.77 mg/dL). Liver Function Tests:  Recent Labs Lab 09/24/15 0159  AST 14*  ALT 13*  ALKPHOS 41  BILITOT 0.1*  PROT 6.7  ALBUMIN 3.1*   No results for input(s): LIPASE, AMYLASE in the last 168 hours. No results for input(s): AMMONIA in the last 168 hours. Coagulation Profile: No results for  input(s): INR, PROTIME in the last 168 hours. Cardiac Enzymes: No results for input(s): CKTOTAL, CKMB, CKMBINDEX, TROPONINI in the last 168 hours. BNP (last 3 results) No results for input(s): PROBNP in the last 8760 hours. HbA1C: No results for input(s): HGBA1C in the last 72 hours. CBG:  Recent Labs Lab 09/23/15 1249  GLUCAP 207*   Lipid Profile: No results for input(s): CHOL, HDL, LDLCALC, TRIG, CHOLHDL, LDLDIRECT in the last 72 hours. Thyroid Function Tests: No results for input(s): TSH, T4TOTAL, FREET4, T3FREE, THYROIDAB in the last 72 hours. Anemia Panel: No results for input(s): VITAMINB12, FOLATE, FERRITIN, TIBC, IRON, RETICCTPCT in the last 72 hours. Urine analysis:    Component Value Date/Time   COLORURINE YELLOW 09/23/2015 1316   APPEARANCEUR CLOUDY (A) 09/23/2015 1316   LABSPEC 1.002 (L) 09/23/2015 1316   PHURINE 5.0 09/23/2015 1316   GLUCOSEU 100 (A) 09/23/2015 1316   HGBUR NEGATIVE 09/23/2015 1316   BILIRUBINUR NEGATIVE 09/23/2015 1316   KETONESUR NEGATIVE 09/23/2015 1316   PROTEINUR NEGATIVE 09/23/2015 1316   UROBILINOGEN 0.2 08/04/2015 1728   NITRITE NEGATIVE 09/23/2015 1316   LEUKOCYTESUR LARGE (A) 09/23/2015 1316   Sepsis Labs: Invalid input(s): PROCALCITONIN, LACTICIDVEN  Recent Results (from the past 240 hour(s))  Urine culture     Status: Abnormal   Collection Time: 09/23/15  1:16 PM  Result Value Ref Range Status   Specimen Description URINE, RANDOM  Final   Special Requests NONE  Final   Culture MULTIPLE SPECIES PRESENT, SUGGEST RECOLLECTION (A)  Final   Report Status 09/24/2015 FINAL  Final  Blood Culture (routine x 2)     Status: Abnormal   Collection Time: 09/23/15  1:43 PM  Result Value Ref Range Status   Specimen Description BLOOD LEFT ANTECUBITAL  Final   Special Requests BOTTLES DRAWN AEROBIC AND ANAEROBIC  5CC  Final   Culture  Setup Time   Final    GRAM POSITIVE COCCI IN CLUSTERS AEROBIC BOTTLE ONLY CRITICAL RESULT CALLED TO, READ  BACK BY AND VERIFIED WITH: N. Batchelder Pharm.D. 13:40 09/24/15  ([email or web address])    Culture (A)  Final    STAPHYLOCOCCUS SPECIES (COAGULASE NEGATIVE) THE SIGNIFICANCE OF ISOLATING THIS ORGANISM FROM A SINGLE SET OF BLOOD CULTURES WHEN MULTIPLE SETS ARE DRAWN IS UNCERTAIN. PLEASE NOTIFY THE MICROBIOLOGY DEPARTMENT WITHIN ONE WEEK IF SPECIATION AND SENSITIVITIES ARE REQUIRED.    Report Status 09/26/2015 FINAL  Final  Blood Culture ID Panel (Reflexed)     Status: Abnormal   Collection Time: 09/23/15  1:43 PM  Result Value Ref Range Status   Enterococcus species NOT DETECTED NOT DETECTED Final   Listeria monocytogenes NOT DETECTED NOT DETECTED Final   Staphylococcus species DETECTED (A) NOT DETECTED Final  Comment: CRITICAL RESULT CALLED TO, READ BACK BY AND VERIFIED WITH: N. Batchelder Pharm.D. 13:40 09/24/15 ([email or web address])    Staphylococcus aureus NOT DETECTED NOT DETECTED Final   Methicillin resistance NOT DETECTED NOT DETECTED Final   Streptococcus species NOT DETECTED NOT DETECTED Final   Streptococcus agalactiae NOT DETECTED NOT DETECTED Final   Streptococcus pneumoniae NOT DETECTED NOT DETECTED Final   Streptococcus pyogenes NOT DETECTED NOT DETECTED Final   Acinetobacter baumannii NOT DETECTED NOT DETECTED Final   Enterobacteriaceae species NOT DETECTED NOT DETECTED Final   Enterobacter cloacae complex NOT DETECTED NOT DETECTED Final   Escherichia coli NOT DETECTED NOT DETECTED Final   Klebsiella oxytoca NOT DETECTED NOT DETECTED Final   Klebsiella pneumoniae NOT DETECTED NOT DETECTED Final   Proteus species NOT DETECTED NOT DETECTED Final   Serratia marcescens NOT DETECTED NOT DETECTED Final   Haemophilus influenzae NOT DETECTED NOT DETECTED Final   Neisseria meningitidis NOT DETECTED NOT DETECTED Final   Pseudomonas aeruginosa NOT DETECTED NOT DETECTED Final   Candida albicans NOT DETECTED NOT DETECTED Final   Candida glabrata NOT DETECTED NOT DETECTED Final   Candida krusei NOT  DETECTED NOT DETECTED Final   Candida parapsilosis NOT DETECTED NOT DETECTED Final   Candida tropicalis NOT DETECTED NOT DETECTED Final  Blood Culture (routine x 2)     Status: None (Preliminary result)   Collection Time: 09/23/15  1:48 PM  Result Value Ref Range Status   Specimen Description BLOOD RIGHT ANTECUBITAL  Final   Special Requests BOTTLES DRAWN AEROBIC AND ANAEROBIC  5CC  Final   Culture NO GROWTH 3 DAYS  Final   Report Status PENDING  Incomplete  Wet prep, genital     Status: Abnormal   Collection Time: 09/23/15  3:45 PM  Result Value Ref Range Status   Yeast Wet Prep HPF POC NONE SEEN NONE SEEN Final   Trich, Wet Prep PRESENT (A) NONE SEEN Final   Clue Cells Wet Prep HPF POC NONE SEEN NONE SEEN Final   WBC, Wet Prep HPF POC MANY (A) NONE SEEN Final   Sperm NONE SEEN  Final  MRSA PCR Screening     Status: None   Collection Time: 09/23/15 11:54 PM  Result Value Ref Range Status   MRSA by PCR NEGATIVE NEGATIVE Final    Comment:        The GeneXpert MRSA Assay (FDA approved for NASAL specimens only), is one component of a comprehensive MRSA colonization surveillance program. It is not intended to diagnose MRSA infection nor to guide or monitor treatment for MRSA infections.   Respiratory Panel by PCR     Status: Abnormal   Collection Time: 09/25/15  1:00 AM  Result Value Ref Range Status   Adenovirus NOT DETECTED NOT DETECTED Final   Coronavirus 229E NOT DETECTED NOT DETECTED Final   Coronavirus HKU1 NOT DETECTED NOT DETECTED Final   Coronavirus NL63 NOT DETECTED NOT DETECTED Final   Coronavirus OC43 NOT DETECTED NOT DETECTED Final   Metapneumovirus NOT DETECTED NOT DETECTED Final   Rhinovirus / Enterovirus DETECTED (A) NOT DETECTED Final   Influenza A NOT DETECTED NOT DETECTED Final   Influenza B NOT DETECTED NOT DETECTED Final   Parainfluenza Virus 1 NOT DETECTED NOT DETECTED Final   Parainfluenza Virus 2 NOT DETECTED NOT DETECTED Final   Parainfluenza Virus  3 NOT DETECTED NOT DETECTED Final   Parainfluenza Virus 4 NOT DETECTED NOT DETECTED Final   Respiratory Syncytial Virus NOT DETECTED NOT DETECTED Final  Bordetella pertussis NOT DETECTED NOT DETECTED Final   Chlamydophila pneumoniae NOT DETECTED NOT DETECTED Final   Mycoplasma pneumoniae NOT DETECTED NOT DETECTED Final      Radiology Studies: No results found.   Scheduled Meds: . budesonide (PULMICORT) nebulizer solution  0.25 mg Nebulization BID  . enoxaparin (LOVENOX) injection  40 mg Subcutaneous QHS  . escitalopram  10 mg Oral q morning - 10a  . ipratropium-albuterol  3 mL Nebulization TID  . levofloxacin  750 mg Oral Daily  . methylPREDNISolone (SOLU-MEDROL) injection  125 mg Intravenous Q12H  . mometasone-formoterol  2 puff Inhalation BID   Continuous Infusions:    Marzetta Board, MD, PhD [facility] Pager [phone]  If 7PM-7AM, please contact night-coverage [email or web address] Password [email or web address] 09/27/2015, 11:16 AM

## 2015-09-28 DIAGNOSIS — J45901 Unspecified asthma with (acute) exacerbation: Secondary | ICD-10-CM

## 2015-09-28 LAB — CULTURE, BLOOD (ROUTINE X 2): CULTURE: NO GROWTH

## 2015-09-28 MED ORDER — BUDESONIDE 180 MCG/ACT IN AEPB: 1.0000 | INHALATION_SPRAY | Freq: Two times a day (BID) | RESPIRATORY_TRACT | 2 refills | Status: DC

## 2015-09-28 MED ORDER — GUAIFENESIN-DM 100-10 MG/5ML PO SYRP: 5.0000 mL | ORAL_SOLUTION | Freq: Four times a day (QID) | ORAL | 0 refills | Status: DC | PRN

## 2015-09-28 MED ORDER — PREDNISONE 10 MG PO TABS: ORAL_TABLET | ORAL | 0 refills | Status: DC

## 2015-09-28 MED ORDER — FAMOTIDINE 20 MG PO TABS
20.0000 mg | ORAL_TABLET | Freq: Two times a day (BID) | ORAL | Status: DC | PRN
  Administered 2015-09-28: 20 mg via ORAL
  Filled 2015-09-28: qty 1

## 2015-09-28 MED ORDER — ALBUTEROL SULFATE HFA 108 (90 BASE) MCG/ACT IN AERS: 2.0000 | INHALATION_SPRAY | Freq: Four times a day (QID) | RESPIRATORY_TRACT | 3 refills | Status: DC | PRN

## 2015-09-28 NOTE — Discharge Summary (Addendum)
Physician Discharge Summary  RAKITA JESSER [id number] DOB: 1990/01/15 DOA: 09/23/2015  PCP: No PCP Per Patient  Admit date: 09/23/2015 Discharge date: 09/28/2015  Admitted From: home Disposition:  home  Recommendations for Outpatient Follow-up:  1. Follow up with PCP in 1-2 weeks 2. Continue Steroid taper as below  Home Health: none Equipment/Devices: none  Discharge Condition: stable CODE STATUS: Full Diet recommendation: regular  HPI: KORIN AUCH is a 25 y.o. female with asthma presents to the ER because of worsening shortness of breath over the last 24 hours. Patient started having upper respiratory tract infection symptoms yesterday and gradually started having productive cough with wheezing and shortness of breath. Denies any chest pain. In addition patient was complaining of lower abdominal crampy pain. Denies any vaginal discharge. CT angiogram of the chest and abdomen and pelvis only shows possible pneumonitis. Pelvic exam was done and was positive for trichomonas. Patient's lactic acid was 8 and critical care was consulted. Per Pulmonary critical care consult patient's lactate was elevated due to shortness of breath and nebulizer use and patient can be admitted to stepdown unit. On my exam patient is not in distress was still wheezing. Denies any chest pain.   ED Course: Patient was given antibiotics nebulizer treatment and steroids for asthma. Patient was given fluid bolus for the elevated lactate. Lactate level was showing a decreasing trend.  Hospital Course: Discharge Diagnoses:  Principal Problem:   Acute respiratory failure with hypoxemia (HCC) Active Problems:   SIRS (systemic inflammatory response syndrome) (HCC)   Normochromic normocytic anemia   Asthma exacerbation   Metabolic acidosis  Acute respiratory failure with hypoxemia secondary to asthma exacerbation due to rhinovirus - patient with somewhat slow recovery eventually improved, was stable  on regular floor, able to ambulate in the hallway on room air, was discharged home in stable condition with inhalers and a prednisone taper. Blood cultures with CoagNegative Staph 1/2 bottles, likely contaminant. Hypoxemia improved, now on room air. She was maintained on empiric levaquin while hospitalized, received 5 days, will not need further antibiotics on discharge since likely her asthma exacerbation was due to rhinovirus as above. SIRS/Metabolic acidosis with elevated lactate- lactate with significant elevation on admission to 8.4, now within normal limits Trichomonas- given 2 g of Flagyl Normocytic normochromic anemia- follow CBC. Patient needs outpatient workup for her anemia   Discharge Instructions  Discharge Instructions    Diet - low sodium heart healthy    Complete by:  As directed    Increase activity slowly    Complete by:  As directed        Medication List    STOP taking these medications   albuterol (5 MG/ML) 0.5% nebulizer solution Commonly known as:  PROVENTIL Replaced by:  albuterol 108 (90 Base) MCG/ACT inhaler     TAKE these medications   albuterol 108 (90 Base) MCG/ACT inhaler Commonly known as:  PROVENTIL HFA;VENTOLIN HFA Inhale 2 puffs into the lungs every 6 (six) hours as needed for wheezing or shortness of breath. Replaces:  albuterol (5 MG/ML) 0.5% nebulizer solution   budesonide 180 MCG/ACT inhaler Commonly known as:  PULMICORT Inhale 1 puff into the lungs 2 (two) times daily.   escitalopram 10 MG tablet Commonly known as:  LEXAPRO Take 10 mg by mouth every morning.   guaiFENesin-dextromethorphan 100-10 MG/5ML syrup Commonly known as:  ROBITUSSIN DM Take 5 mLs by mouth every 6 (six) hours as needed for cough. OK to dispense generic   predniSONE 10 MG  tablet Commonly known as:  DELTASONE 4 tablets 3 days, 3 tablets 3 days, 2 tablets 3 days, 1 tablet 3 days      Follow-up Information    Primary care provider. Schedule an  appointment as soon as possible for a visit in 2 day(s).   Why:  Hospital follow-up         Allergies  Allergen Reactions  . Penicillins Hives and Rash    Has patient had a PCN reaction causing immediate rash, facial/tongue/throat swelling, SOB or lightheadedness with hypotension: Yes Has patient had a PCN reaction causing severe rash involving mucus membranes or skin necrosis: No Has patient had a PCN reaction that required hospitalization No Has patient had a PCN reaction occurring within the last 10 years: Yes If all of the above answers are "NO", then may proceed with Cephalosporin use.     Consultations:  None   Procedures/Studies:  None   Dg Chest 2 View  Result Date: 09/23/2015 CLINICAL DATA:  Patient with cough and shortness of breath. Mid sternal chest pain. EXAM: CHEST  2 VIEW COMPARISON:  Chest radiograph 08/20/2015 FINDINGS: Normal cardiac and mediastinal contours. No consolidative pulmonary opacities. No pleural effusion or pneumothorax. Regional skeleton is unremarkable. IMPRESSION: No active cardiopulmonary disease. Electronically Signed   By: Lovey Newcomer M.D.   On: 09/23/2015 09:12   Ct Angio Chest Pe W And/or Wo Contrast  Result Date: 09/23/2015 CLINICAL DATA:  25 year old female with severe sepsis, shortness of breath and chest pain. EXAM: CT ANGIOGRAPHY CHEST CT ABDOMEN AND PELVIS WITH CONTRAST TECHNIQUE: Multidetector CT imaging of the chest was performed using the standard protocol during bolus administration of intravenous contrast. Multiplanar CT image reconstructions and MIPs were obtained to evaluate the vascular anatomy. Multidetector CT imaging of the abdomen and pelvis was performed using the standard protocol during bolus administration of intravenous contrast. CONTRAST:  100 cc Isovue 370 IV. COMPARISON:  None. FINDINGS: CTA CHEST FINDINGS Cardiovascular: The study is moderate quality for the evaluation of pulmonary embolism, with limited evaluation of  the subsegmental pulmonary arteries due to motion and limited contrast opacification. There are no filling defects in the central, lobar or segmental pulmonary artery branches to suggest acute pulmonary embolism. Normal course and caliber of the thoracic aorta. Top-normal caliber main pulmonary artery (3.2 cm diameter). Normal heart size. No significant pericardial fluid/thickening. Mediastinum/Nodes: No discrete thyroid nodules. Unremarkable esophagus. No pathologically enlarged axillary, mediastinal or hilar lymph nodes. Lungs/Pleura: No pneumothorax. No pleural effusion. No acute consolidative airspace disease, lung masses or significant pulmonary nodules. There are mild patchy ground-glass opacities in the upper lungs, most prominent in the right upper lobe. Musculoskeletal:  No aggressive appearing focal osseous lesions. Review of the MIP images confirms the above findings. CT ABDOMEN and PELVIS FINDINGS Motion degraded study . Hepatobiliary: Normal liver with no liver mass. Normal gallbladder with no radiopaque cholelithiasis. No biliary ductal dilatation. Pancreas: Normal, with no mass or duct dilation. Spleen: Normal size. No mass. Adrenals/Urinary Tract: Normal adrenals. Normal kidneys with no hydronephrosis and no renal mass. Normal bladder. Stomach/Bowel: Grossly normal stomach. Normal caliber small bowel with no small bowel wall thickening. Normal appendix . Normal large bowel with no diverticulosis, large bowel wall thickening or pericolonic fat stranding. Vascular/Lymphatic: Normal caliber abdominal aorta. Patent portal, splenic, hepatic and renal veins. No pathologically enlarged lymph nodes in the abdomen or pelvis. Reproductive: Grossly normal uterus.  No adnexal mass. Other: No pneumoperitoneum, ascites or focal fluid collection. Musculoskeletal: No aggressive appearing focal osseous  lesions. Review of the MIP images confirms the above findings. IMPRESSION: 1. No evidence of pulmonary embolism,  with limitations as described . 2. Mild patchy ground-glass opacities in the upper lungs, with a broad differential including atypical infection, vasculitis or other inflammatory etiologies. 3. No acute abnormality in the abdomen or pelvis. No evidence of bowel obstruction or acute bowel inflammation. Normal appendix. Electronically Signed   By: Ilona Sorrel M.D.   On: 09/23/2015 18:08   Ct Abdomen Pelvis W Contrast  Result Date: 09/23/2015 CLINICAL DATA:  25 year old female with severe sepsis, shortness of breath and chest pain. EXAM: CT ANGIOGRAPHY CHEST CT ABDOMEN AND PELVIS WITH CONTRAST TECHNIQUE: Multidetector CT imaging of the chest was performed using the standard protocol during bolus administration of intravenous contrast. Multiplanar CT image reconstructions and MIPs were obtained to evaluate the vascular anatomy. Multidetector CT imaging of the abdomen and pelvis was performed using the standard protocol during bolus administration of intravenous contrast. CONTRAST:  100 cc Isovue 370 IV. COMPARISON:  None. FINDINGS: CTA CHEST FINDINGS Cardiovascular: The study is moderate quality for the evaluation of pulmonary embolism, with limited evaluation of the subsegmental pulmonary arteries due to motion and limited contrast opacification. There are no filling defects in the central, lobar or segmental pulmonary artery branches to suggest acute pulmonary embolism. Normal course and caliber of the thoracic aorta. Top-normal caliber main pulmonary artery (3.2 cm diameter). Normal heart size. No significant pericardial fluid/thickening. Mediastinum/Nodes: No discrete thyroid nodules. Unremarkable esophagus. No pathologically enlarged axillary, mediastinal or hilar lymph nodes. Lungs/Pleura: No pneumothorax. No pleural effusion. No acute consolidative airspace disease, lung masses or significant pulmonary nodules. There are mild patchy ground-glass opacities in the upper lungs, most prominent in the right upper  lobe. Musculoskeletal:  No aggressive appearing focal osseous lesions. Review of the MIP images confirms the above findings. CT ABDOMEN and PELVIS FINDINGS Motion degraded study . Hepatobiliary: Normal liver with no liver mass. Normal gallbladder with no radiopaque cholelithiasis. No biliary ductal dilatation. Pancreas: Normal, with no mass or duct dilation. Spleen: Normal size. No mass. Adrenals/Urinary Tract: Normal adrenals. Normal kidneys with no hydronephrosis and no renal mass. Normal bladder. Stomach/Bowel: Grossly normal stomach. Normal caliber small bowel with no small bowel wall thickening. Normal appendix . Normal large bowel with no diverticulosis, large bowel wall thickening or pericolonic fat stranding. Vascular/Lymphatic: Normal caliber abdominal aorta. Patent portal, splenic, hepatic and renal veins. No pathologically enlarged lymph nodes in the abdomen or pelvis. Reproductive: Grossly normal uterus.  No adnexal mass. Other: No pneumoperitoneum, ascites or focal fluid collection. Musculoskeletal: No aggressive appearing focal osseous lesions. Review of the MIP images confirms the above findings. IMPRESSION: 1. No evidence of pulmonary embolism, with limitations as described . 2. Mild patchy ground-glass opacities in the upper lungs, with a broad differential including atypical infection, vasculitis or other inflammatory etiologies. 3. No acute abnormality in the abdomen or pelvis. No evidence of bowel obstruction or acute bowel inflammation. Normal appendix. Electronically Signed   By: Ilona Sorrel M.D.   On: 09/23/2015 18:08      Subjective: - no chest pain, shortness of breath, no abdominal pain, nausea or vomiting. Ambulating in the hallway  Discharge Exam: Vitals:   09/27/15 2049 09/28/15 0513  BP: 128/76 131/62  Pulse: 74 (!) 56  Resp: 18 18  Temp: 98.5 F (36.9 C) 99.4 F (37.4 C)   Vitals:   09/28/15 0513 09/28/15 0851 09/28/15 0852 09/28/15 0854  BP: 131/62     Pulse: Marland Kitchen)  56     Resp: 18     Temp: 99.4 F (37.4 C)     TempSrc: Oral     SpO2: 99% 100% 100% 100%  Weight:      Height:        General: Pt is alert, awake, not in acute distress Cardiovascular: RRR, S1/S2 +, no rubs, no gallops Respiratory: CTA bilaterally, no wheezing, no rhonchi Abdominal: Soft, NT, ND, bowel sounds + Extremities: no edema, no cyanosis    The results of significant diagnostics from this hospitalization (including imaging, microbiology, ancillary and laboratory) are listed below for reference.     Microbiology: Recent Results (from the past 240 hour(s))  Urine culture     Status: Abnormal   Collection Time: 09/23/15  1:16 PM  Result Value Ref Range Status   Specimen Description URINE, RANDOM  Final   Special Requests NONE  Final   Culture MULTIPLE SPECIES PRESENT, SUGGEST RECOLLECTION (A)  Final   Report Status 09/24/2015 FINAL  Final  Blood Culture (routine x 2)     Status: Abnormal   Collection Time: 09/23/15  1:43 PM  Result Value Ref Range Status   Specimen Description BLOOD LEFT ANTECUBITAL  Final   Special Requests BOTTLES DRAWN AEROBIC AND ANAEROBIC  5CC  Final   Culture  Setup Time   Final    GRAM POSITIVE COCCI IN CLUSTERS AEROBIC BOTTLE ONLY CRITICAL RESULT CALLED TO, READ BACK BY AND VERIFIED WITH: N. Batchelder Pharm.D. 13:40 09/24/15  ([email or web address])    Culture (A)  Final    STAPHYLOCOCCUS SPECIES (COAGULASE NEGATIVE) THE SIGNIFICANCE OF ISOLATING THIS ORGANISM FROM A SINGLE SET OF BLOOD CULTURES WHEN MULTIPLE SETS ARE DRAWN IS UNCERTAIN. PLEASE NOTIFY THE MICROBIOLOGY DEPARTMENT WITHIN ONE WEEK IF SPECIATION AND SENSITIVITIES ARE REQUIRED.    Report Status 09/26/2015 FINAL  Final  Blood Culture ID Panel (Reflexed)     Status: Abnormal   Collection Time: 09/23/15  1:43 PM  Result Value Ref Range Status   Enterococcus species NOT DETECTED NOT DETECTED Final   Listeria monocytogenes NOT DETECTED NOT DETECTED Final   Staphylococcus species DETECTED (A)  NOT DETECTED Final    Comment: CRITICAL RESULT CALLED TO, READ BACK BY AND VERIFIED WITH: N. Batchelder Pharm.D. 13:40 09/24/15 ([email or web address])    Staphylococcus aureus NOT DETECTED NOT DETECTED Final   Methicillin resistance NOT DETECTED NOT DETECTED Final   Streptococcus species NOT DETECTED NOT DETECTED Final   Streptococcus agalactiae NOT DETECTED NOT DETECTED Final   Streptococcus pneumoniae NOT DETECTED NOT DETECTED Final   Streptococcus pyogenes NOT DETECTED NOT DETECTED Final   Acinetobacter baumannii NOT DETECTED NOT DETECTED Final   Enterobacteriaceae species NOT DETECTED NOT DETECTED Final   Enterobacter cloacae complex NOT DETECTED NOT DETECTED Final   Escherichia coli NOT DETECTED NOT DETECTED Final   Klebsiella oxytoca NOT DETECTED NOT DETECTED Final   Klebsiella pneumoniae NOT DETECTED NOT DETECTED Final   Proteus species NOT DETECTED NOT DETECTED Final   Serratia marcescens NOT DETECTED NOT DETECTED Final   Haemophilus influenzae NOT DETECTED NOT DETECTED Final   Neisseria meningitidis NOT DETECTED NOT DETECTED Final   Pseudomonas aeruginosa NOT DETECTED NOT DETECTED Final   Candida albicans NOT DETECTED NOT DETECTED Final   Candida glabrata NOT DETECTED NOT DETECTED Final   Candida krusei NOT DETECTED NOT DETECTED Final   Candida parapsilosis NOT DETECTED NOT DETECTED Final   Candida tropicalis NOT DETECTED NOT DETECTED Final  Blood Culture (routine x 2)     Status:  None (Preliminary result)   Collection Time: 09/23/15  1:48 PM  Result Value Ref Range Status   Specimen Description BLOOD RIGHT ANTECUBITAL  Final   Special Requests BOTTLES DRAWN AEROBIC AND ANAEROBIC  5CC  Final   Culture NO GROWTH 4 DAYS  Final   Report Status PENDING  Incomplete  Wet prep, genital     Status: Abnormal   Collection Time: 09/23/15  3:45 PM  Result Value Ref Range Status   Yeast Wet Prep HPF POC NONE SEEN NONE SEEN Final   Trich, Wet Prep PRESENT (A) NONE SEEN Final   Clue Cells Wet Prep  HPF POC NONE SEEN NONE SEEN Final   WBC, Wet Prep HPF POC MANY (A) NONE SEEN Final   Sperm NONE SEEN  Final  MRSA PCR Screening     Status: None   Collection Time: 09/23/15 11:54 PM  Result Value Ref Range Status   MRSA by PCR NEGATIVE NEGATIVE Final    Comment:        The GeneXpert MRSA Assay (FDA approved for NASAL specimens only), is one component of a comprehensive MRSA colonization surveillance program. It is not intended to diagnose MRSA infection nor to guide or monitor treatment for MRSA infections.   Respiratory Panel by PCR     Status: Abnormal   Collection Time: 09/25/15  1:00 AM  Result Value Ref Range Status   Adenovirus NOT DETECTED NOT DETECTED Final   Coronavirus 229E NOT DETECTED NOT DETECTED Final   Coronavirus HKU1 NOT DETECTED NOT DETECTED Final   Coronavirus NL63 NOT DETECTED NOT DETECTED Final   Coronavirus OC43 NOT DETECTED NOT DETECTED Final   Metapneumovirus NOT DETECTED NOT DETECTED Final   Rhinovirus / Enterovirus DETECTED (A) NOT DETECTED Final   Influenza A NOT DETECTED NOT DETECTED Final   Influenza B NOT DETECTED NOT DETECTED Final   Parainfluenza Virus 1 NOT DETECTED NOT DETECTED Final   Parainfluenza Virus 2 NOT DETECTED NOT DETECTED Final   Parainfluenza Virus 3 NOT DETECTED NOT DETECTED Final   Parainfluenza Virus 4 NOT DETECTED NOT DETECTED Final   Respiratory Syncytial Virus NOT DETECTED NOT DETECTED Final   Bordetella pertussis NOT DETECTED NOT DETECTED Final   Chlamydophila pneumoniae NOT DETECTED NOT DETECTED Final   Mycoplasma pneumoniae NOT DETECTED NOT DETECTED Final     Labs: BNP (last 3 results) No results for input(s): BNP in the last 8760 hours. Basic Metabolic Panel:  Recent Labs Lab 09/23/15 1214 09/23/15 1233 09/23/15 2132 09/24/15 0159  NA 134* 137  --  136  K 3.1* 2.9*  --  4.5  CL 103 100*  --  113*  CO2 19*  --   --  16*  GLUCOSE 195* 205*  --  119*  BUN 6 5*  --  <5*  CREATININE 1.02* 0.90 0.95 0.77    CALCIUM 8.8*  --   --  7.7*  MG  --   --  2.0  --    Liver Function Tests:  Recent Labs Lab 09/24/15 0159  AST 14*  ALT 13*  ALKPHOS 41  BILITOT 0.1*  PROT 6.7  ALBUMIN 3.1*   No results for input(s): LIPASE, AMYLASE in the last 168 hours. No results for input(s): AMMONIA in the last 168 hours. CBC:  Recent Labs Lab 09/23/15 1214 09/23/15 1233 09/23/15 2132 09/24/15 0159  WBC 14.6*  --  11.9* 11.7*  NEUTROABS 13.3*  --   --  10.8*  HGB 9.6* 10.9* 8.9* 8.2*  HCT 31.9* 32.0* 29.1* 27.3*  MCV 83.1  --  82.0 81.7  PLT 245  --  336 328   Cardiac Enzymes: No results for input(s): CKTOTAL, CKMB, CKMBINDEX, TROPONINI in the last 168 hours. BNP: Invalid input(s): POCBNP CBG:  Recent Labs Lab 09/23/15 1249  GLUCAP 207*   D-Dimer No results for input(s): DDIMER in the last 72 hours. Hgb A1c No results for input(s): HGBA1C in the last 72 hours. Lipid Profile No results for input(s): CHOL, HDL, LDLCALC, TRIG, CHOLHDL, LDLDIRECT in the last 72 hours. Thyroid function studies No results for input(s): TSH, T4TOTAL, T3FREE, THYROIDAB in the last 72 hours.  Invalid input(s): FREET3 Anemia work up No results for input(s): VITAMINB12, FOLATE, FERRITIN, TIBC, IRON, RETICCTPCT in the last 72 hours. Urinalysis    Component Value Date/Time   COLORURINE YELLOW 09/23/2015 1316   APPEARANCEUR CLOUDY (A) 09/23/2015 1316   LABSPEC 1.002 (L) 09/23/2015 1316   PHURINE 5.0 09/23/2015 1316   GLUCOSEU 100 (A) 09/23/2015 1316   HGBUR NEGATIVE 09/23/2015 1316   BILIRUBINUR NEGATIVE 09/23/2015 1316   KETONESUR NEGATIVE 09/23/2015 1316   PROTEINUR NEGATIVE 09/23/2015 1316   UROBILINOGEN 0.2 08/04/2015 1728   NITRITE NEGATIVE 09/23/2015 1316   LEUKOCYTESUR LARGE (A) 09/23/2015 1316   Sepsis Labs Invalid input(s): PROCALCITONIN,  WBC,  LACTICIDVEN Microbiology Recent Results (from the past 240 hour(s))  Urine culture     Status: Abnormal   Collection Time: 09/23/15  1:16 PM   Result Value Ref Range Status   Specimen Description URINE, RANDOM  Final   Special Requests NONE  Final   Culture MULTIPLE SPECIES PRESENT, SUGGEST RECOLLECTION (A)  Final   Report Status 09/24/2015 FINAL  Final  Blood Culture (routine x 2)     Status: Abnormal   Collection Time: 09/23/15  1:43 PM  Result Value Ref Range Status   Specimen Description BLOOD LEFT ANTECUBITAL  Final   Special Requests BOTTLES DRAWN AEROBIC AND ANAEROBIC  5CC  Final   Culture  Setup Time   Final    GRAM POSITIVE COCCI IN CLUSTERS AEROBIC BOTTLE ONLY CRITICAL RESULT CALLED TO, READ BACK BY AND VERIFIED WITH: N. Batchelder Pharm.D. 13:40 09/24/15  ([email or web address])    Culture (A)  Final    STAPHYLOCOCCUS SPECIES (COAGULASE NEGATIVE) THE SIGNIFICANCE OF ISOLATING THIS ORGANISM FROM A SINGLE SET OF BLOOD CULTURES WHEN MULTIPLE SETS ARE DRAWN IS UNCERTAIN. PLEASE NOTIFY THE MICROBIOLOGY DEPARTMENT WITHIN ONE WEEK IF SPECIATION AND SENSITIVITIES ARE REQUIRED.    Report Status 09/26/2015 FINAL  Final  Blood Culture ID Panel (Reflexed)     Status: Abnormal   Collection Time: 09/23/15  1:43 PM  Result Value Ref Range Status   Enterococcus species NOT DETECTED NOT DETECTED Final   Listeria monocytogenes NOT DETECTED NOT DETECTED Final   Staphylococcus species DETECTED (A) NOT DETECTED Final    Comment: CRITICAL RESULT CALLED TO, READ BACK BY AND VERIFIED WITH: N. Batchelder Pharm.D. 13:40 09/24/15 ([email or web address])    Staphylococcus aureus NOT DETECTED NOT DETECTED Final   Methicillin resistance NOT DETECTED NOT DETECTED Final   Streptococcus species NOT DETECTED NOT DETECTED Final   Streptococcus agalactiae NOT DETECTED NOT DETECTED Final   Streptococcus pneumoniae NOT DETECTED NOT DETECTED Final   Streptococcus pyogenes NOT DETECTED NOT DETECTED Final   Acinetobacter baumannii NOT DETECTED NOT DETECTED Final   Enterobacteriaceae species NOT DETECTED NOT DETECTED Final   Enterobacter cloacae complex NOT DETECTED NOT  DETECTED Final   Escherichia coli NOT DETECTED NOT DETECTED Final  Klebsiella oxytoca NOT DETECTED NOT DETECTED Final   Klebsiella pneumoniae NOT DETECTED NOT DETECTED Final   Proteus species NOT DETECTED NOT DETECTED Final   Serratia marcescens NOT DETECTED NOT DETECTED Final   Haemophilus influenzae NOT DETECTED NOT DETECTED Final   Neisseria meningitidis NOT DETECTED NOT DETECTED Final   Pseudomonas aeruginosa NOT DETECTED NOT DETECTED Final   Candida albicans NOT DETECTED NOT DETECTED Final   Candida glabrata NOT DETECTED NOT DETECTED Final   Candida krusei NOT DETECTED NOT DETECTED Final   Candida parapsilosis NOT DETECTED NOT DETECTED Final   Candida tropicalis NOT DETECTED NOT DETECTED Final  Blood Culture (routine x 2)     Status: None (Preliminary result)   Collection Time: 09/23/15  1:48 PM  Result Value Ref Range Status   Specimen Description BLOOD RIGHT ANTECUBITAL  Final   Special Requests BOTTLES DRAWN AEROBIC AND ANAEROBIC  5CC  Final   Culture NO GROWTH 4 DAYS  Final   Report Status PENDING  Incomplete  Wet prep, genital     Status: Abnormal   Collection Time: 09/23/15  3:45 PM  Result Value Ref Range Status   Yeast Wet Prep HPF POC NONE SEEN NONE SEEN Final   Trich, Wet Prep PRESENT (A) NONE SEEN Final   Clue Cells Wet Prep HPF POC NONE SEEN NONE SEEN Final   WBC, Wet Prep HPF POC MANY (A) NONE SEEN Final   Sperm NONE SEEN  Final  MRSA PCR Screening     Status: None   Collection Time: 09/23/15 11:54 PM  Result Value Ref Range Status   MRSA by PCR NEGATIVE NEGATIVE Final    Comment:        The GeneXpert MRSA Assay (FDA approved for NASAL specimens only), is one component of a comprehensive MRSA colonization surveillance program. It is not intended to diagnose MRSA infection nor to guide or monitor treatment for MRSA infections.   Respiratory Panel by PCR     Status: Abnormal   Collection Time: 09/25/15  1:00 AM  Result Value Ref Range Status    Adenovirus NOT DETECTED NOT DETECTED Final   Coronavirus 229E NOT DETECTED NOT DETECTED Final   Coronavirus HKU1 NOT DETECTED NOT DETECTED Final   Coronavirus NL63 NOT DETECTED NOT DETECTED Final   Coronavirus OC43 NOT DETECTED NOT DETECTED Final   Metapneumovirus NOT DETECTED NOT DETECTED Final   Rhinovirus / Enterovirus DETECTED (A) NOT DETECTED Final   Influenza A NOT DETECTED NOT DETECTED Final   Influenza B NOT DETECTED NOT DETECTED Final   Parainfluenza Virus 1 NOT DETECTED NOT DETECTED Final   Parainfluenza Virus 2 NOT DETECTED NOT DETECTED Final   Parainfluenza Virus 3 NOT DETECTED NOT DETECTED Final   Parainfluenza Virus 4 NOT DETECTED NOT DETECTED Final   Respiratory Syncytial Virus NOT DETECTED NOT DETECTED Final   Bordetella pertussis NOT DETECTED NOT DETECTED Final   Chlamydophila pneumoniae NOT DETECTED NOT DETECTED Final   Mycoplasma pneumoniae NOT DETECTED NOT DETECTED Final     Time coordinating discharge: < 30 minutes  SIGNED:  Marzetta Board, MD  [facility] 09/28/2015, 11:29 AM Pager [phone]  If 7PM-7AM, please contact night-coverage [email or web address] Password [email or web address]

## 2015-09-28 NOTE — Discharge Instructions (Signed)
Follow with PCP in 5-7 days  Please get a complete blood count and chemistry panel checked by your Primary MD at your next visit, and again as instructed by your Primary MD. Please get your medications reviewed and adjusted by your Primary MD.  Please request your Primary MD to go over all Hospital Tests and Procedure/Radiological results at the follow up, please get all Hospital records sent to your Prim MD by signing hospital release before you go home.  If you had Pneumonia of Lung problems at the Hospital: Please get a 2 view Chest X ray done in 6-8 weeks after hospital discharge or sooner if instructed by your Primary MD.  If you have Congestive Heart Failure: Please call your Cardiologist or Primary MD anytime you have any of the following symptoms:  1) 3 pound weight gain in 24 hours or 5 pounds in 1 week  2) shortness of breath, with or without a dry hacking cough  3) swelling in the hands, feet or stomach  4) if you have to sleep on extra pillows at night in order to breathe  Follow cardiac low salt diet and 1.5 lit/day fluid restriction.  If you have diabetes Accuchecks 4 times/day, Once in AM empty stomach and then before each meal. Log in all results and show them to your primary doctor at your next visit. If any glucose reading is under 80 or above 300 call your primary MD immediately.  If you have Seizure/Convulsions/Epilepsy: Please do not drive, operate heavy machinery, participate in activities at heights or participate in high speed sports until you have seen by Primary MD or a Neurologist and advised to do so again.  If you had Gastrointestinal Bleeding: Please ask your Primary MD to check a complete blood count within one week of discharge or at your next visit. Your endoscopic/colonoscopic biopsies that are pending at the time of discharge, will also need to followed by your Primary MD.  Get Medicines reviewed and adjusted. Please take all your medications with you  for your next visit with your Primary MD  Please request your Primary MD to go over all hospital tests and procedure/radiological results at the follow up, please ask your Primary MD to get all Hospital records sent to his/her office.  If you experience worsening of your admission symptoms, develop shortness of breath, life threatening emergency, suicidal or homicidal thoughts you must seek medical attention immediately by calling 911 or calling your MD immediately  if symptoms less severe.  You must read complete instructions/literature along with all the possible adverse reactions/side effects for all the Medicines you take and that have been prescribed to you. Take any new Medicines after you have completely understood and accpet all the possible adverse reactions/side effects.   Do not drive or operate heavy machinery when taking Pain medications.   Do not take more than prescribed Pain, Sleep and Anxiety Medications  Special Instructions: If you have smoked or chewed Tobacco  in the last 2 yrs please stop smoking, stop any regular Alcohol  and or any Recreational drug use.  Wear Seat belts while driving.  Please note You were cared for by a hospitalist during your hospital stay. If you have any questions about your discharge medications or the care you received while you were in the hospital after you are discharged, you can call the unit and asked to speak with the hospitalist on call if the hospitalist that took care of you is not available. Once you are  discharged, your primary care physician will handle any further medical issues. Please note that NO REFILLS for any discharge medications will be authorized once you are discharged, as it is imperative that you return to your primary care physician (or establish a relationship with a primary care physician if you do not have one) for your aftercare needs so that they can reassess your need for medications and monitor your lab values. ° °You  can reach the hospitalist office at phone [phone] or fax [phone] °  °If you do not have a primary care physician, you can call [phone] for a physician referral. ° °Activity: As tolerated with Full fall precautions use walker/cane & assistance as needed ° °Diet: regular ° °Disposition Home ° ° °

## 2015-09-28 NOTE — Progress Notes (Signed)
[name] to be D/C'd to home per MD order.  Discussed with the patient and all questions fully answered.  VSS, Skin clean, dry and intact without evidence of skin break down, no evidence of skin tears noted. IV catheter discontinued intact. Site without signs and symptoms of complications. Dressing and pressure applied.  An After Visit Summary was printed and given to the patient. Patient received prescriptions and doctor's note for work.  D/c education completed with patient/family including follow up instructions, medication list, d/c activities limitations if indicated, with other d/c instructions as indicated by MD - patient able to verbalize understanding, all questions fully answered.   Patient instructed to return to ED, call 911, or call MD for any changes in condition.   Patient escorted via [place], and D/C home via private auto.  Morley Kos Price 09/28/2015 10:50 AM

## 2015-09-30 ENCOUNTER — Other Ambulatory Visit: Payer: Self-pay

## 2015-09-30 ENCOUNTER — Encounter (HOSPITAL_COMMUNITY): Payer: Self-pay | Admitting: *Deleted

## 2015-09-30 ENCOUNTER — Emergency Department (HOSPITAL_COMMUNITY)
Admission: EM | Admit: 2015-09-30 | Discharge: 2015-09-30 | Disposition: A | Discharge status: Home / Self Care | Payer: Medicaid Other | Attending: Emergency Medicine | Admitting: Emergency Medicine

## 2015-09-30 ENCOUNTER — Emergency Department (HOSPITAL_COMMUNITY): Payer: Medicaid Other

## 2015-09-30 DIAGNOSIS — B349 Viral infection, unspecified: Secondary | ICD-10-CM | POA: Diagnosis not present

## 2015-09-30 DIAGNOSIS — R55 Syncope and collapse: Secondary | ICD-10-CM | POA: Diagnosis present

## 2015-09-30 DIAGNOSIS — J45909 Unspecified asthma, uncomplicated: Secondary | ICD-10-CM | POA: Diagnosis not present

## 2015-09-30 DIAGNOSIS — E860 Dehydration: Secondary | ICD-10-CM | POA: Insufficient documentation

## 2015-09-30 DIAGNOSIS — Z87891 Personal history of nicotine dependence: Secondary | ICD-10-CM | POA: Insufficient documentation

## 2015-09-30 LAB — I-STAT CG4 LACTIC ACID, ED
LACTIC ACID, VENOUS: 2.83 mmol/L — AB (ref 0.5–1.9)
LACTIC ACID, VENOUS: 1.9 mmol/L (ref 0.5–1.9)

## 2015-09-30 LAB — URINALYSIS, ROUTINE W REFLEX MICROSCOPIC
Bilirubin Urine: NEGATIVE
GLUCOSE, UA: NEGATIVE mg/dL
Ketones, ur: NEGATIVE mg/dL
NITRITE: NEGATIVE
PH: 6 (ref 5.0–8.0)
Protein, ur: NEGATIVE mg/dL
SPECIFIC GRAVITY, URINE: 1.015 (ref 1.005–1.030)

## 2015-09-30 LAB — CBC
HEMATOCRIT: 31.7 % — AB (ref 36.0–46.0)
HEMOGLOBIN: 9.8 g/dL — AB (ref 12.0–15.0)
MCH: 24.9 pg — ABNORMAL LOW (ref 26.0–34.0)
MCHC: 30.9 g/dL (ref 30.0–36.0)
MCV: 80.5 fL (ref 78.0–100.0)
Platelets: 506 10*3/uL — ABNORMAL HIGH (ref 150–400)
RBC: 3.94 MIL/uL (ref 3.87–5.11)
RDW: 14.8 % (ref 11.5–15.5)
WBC: 20.8 10*3/uL — AB (ref 4.0–10.5)

## 2015-09-30 LAB — BASIC METABOLIC PANEL
ANION GAP: 10 (ref 5–15)
BUN: 17 mg/dL (ref 6–20)
CHLORIDE: 100 mmol/L — AB (ref 101–111)
CO2: 25 mmol/L (ref 22–32)
Calcium: 9.1 mg/dL (ref 8.9–10.3)
Creatinine, Ser: 0.91 mg/dL (ref 0.44–1.00)
GFR calc Af Amer: >60 mL/min (ref >60)
Glucose, Bld: 114 mg/dL — ABNORMAL HIGH (ref 65–99)
POTASSIUM: 4.5 mmol/L (ref 3.5–5.1)
SODIUM: 135 mmol/L (ref 135–145)

## 2015-09-30 LAB — I-STAT TROPONIN, ED: Troponin i, poc: 0 ng/mL (ref 0.00–0.08)

## 2015-09-30 LAB — URINE MICROSCOPIC-ADD ON: WBC, UA: NONE SEEN WBC/hpf (ref 0–5)

## 2015-09-30 LAB — I-STAT BETA HCG BLOOD, ED (MC, WL, AP ONLY): I-stat hCG, quantitative: <5 m[IU]/mL (ref <5)

## 2015-09-30 LAB — MONONUCLEOSIS SCREEN: Mono Screen: NEGATIVE

## 2015-09-30 MED ORDER — IPRATROPIUM-ALBUTEROL 0.5-2.5 (3) MG/3ML IN SOLN
3.0000 mL | Freq: Once | RESPIRATORY_TRACT | Status: AC
  Administered 2015-09-30: 3 mL via RESPIRATORY_TRACT
  Filled 2015-09-30: qty 3

## 2015-09-30 MED ORDER — ONDANSETRON 4 MG PO TBDP
8.0000 mg | ORAL_TABLET | Freq: Once | ORAL | Status: AC
  Administered 2015-09-30: 8 mg via ORAL
  Filled 2015-09-30: qty 2

## 2015-09-30 MED ORDER — ALBUTEROL SULFATE (2.5 MG/3ML) 0.083% IN NEBU: 2.5000 mg | INHALATION_SOLUTION | RESPIRATORY_TRACT | 1 refills | Status: DC | PRN

## 2015-09-30 MED ORDER — SODIUM CHLORIDE 0.9 % IV BOLUS (SEPSIS)
1000.0000 mL | Freq: Once | INTRAVENOUS | Status: AC
  Administered 2015-09-30: 1000 mL via INTRAVENOUS

## 2015-09-30 MED ORDER — IOPAMIDOL (ISOVUE-370) INJECTION 76%
INTRAVENOUS | Status: AC
  Administered 2015-09-30: 100 mL
  Filled 2015-09-30: qty 100

## 2015-09-30 MED ORDER — ACETAMINOPHEN 500 MG PO TABS
1000.0000 mg | ORAL_TABLET | Freq: Once | ORAL | Status: AC
  Administered 2015-09-30: 1000 mg via ORAL
  Filled 2015-09-30: qty 2

## 2015-09-30 NOTE — Discharge Instructions (Signed)
Your tests show that you are dehydrated. You need to continue your prednisone.  YOU NEED TO BE DRINKING ABOUT 108 0Z OF FLUID/ DAY ( HALF YOUR BODY WEIGHT IN OUNCES OF FLUID- DRINK WATER OR GATORADE).  YOU NEED PLENTY OF REST. YOU MAY TAKE DAYQUIL/ NYQUIL OR THERAFLU FOR SYMPTOM CONTROL.  SEEK IMMEDIATE MEDICAL CARE IF:  Your skin or nails turn bluish.  You have a skin rash.  You are urinating noticeably less or not at all.  You have NEW chest pain.  You have WORSENING trouble breathing.  You feel like your heart is fluttering, OR skipping a beat.  You have confusion.  You have LOOSE CONSCIOUSNESS  You develop a sudden headache, or pain in your face or ear.  You have severe pain or stiffness in your neck.  You cough up blood.  You have nausea and vomiting that you cannot control.

## 2015-09-30 NOTE — ED Notes (Signed)
Pt back from CT

## 2015-09-30 NOTE — ED Provider Notes (Signed)
[facility] DEPT Provider Note   CSN: [id number] Arrival date & time: 09/30/15  1552     History   Chief Complaint Chief Complaint  Patient presents with  . Near Syncope  . Fatigue  . Shortness of Breath    HPI Kathy Flores is a 25 y.o. female who presents emergency Department with chief complaint of weakness and shortness of breath. Patient was released from the hospital 2 days ago after admission for acute respiratory failure with hypoxemia, pneumonitis, and so his reaction 2 Rhinovirus.  The patient states that she has felt extremely exhausted. She feels like she can't stand for longer than 2-3 minutes without things that she is going to pass out. She is dizzy when sitting up. She has had persistent wheezing and shortness of breath that has been using her inhaler without significant relief of her symptoms. She has also had dyspnea on exertion. She denies hemoptysis. She is persistent cough, body aches and headache. She denies any photophobia, phonophobia or neck stiffness.  HPI  Past Medical History:  Diagnosis Date  . Asthma    inhaler used 3x month  . Depression   . Fibroid   . Trichomonas infection   . Vaginal Pap smear, abnormal     Patient Active Problem List   Diagnosis Date Noted  . Asthma exacerbation   . Metabolic acidosis   . Acute respiratory failure with hypoxemia ([place]) 09/23/2015  . SIRS (systemic inflammatory response syndrome) ([place]) 09/23/2015  . Normochromic normocytic anemia 09/23/2015  . Lactic acidosis   . Encounter for observation of infant for suspected infection   . Post term pregnancy, 41 weeks 11/22/2013  . Post-term pregnancy, 40-42 weeks of gestation   . [redacted] weeks gestation of pregnancy     Past Surgical History:  Procedure Laterality Date  . CESAREAN SECTION N/A 11/23/2013   Procedure: CESAREAN SECTION;  Surgeon: Woodroe Mode, MD;  Location: [facility];  Service: Obstetrics;  Laterality: N/A;  . NO PAST SURGERIES      OB History     Gravida Para Term Preterm AB Living   3 3 3     3    SAB TAB Ectopic Multiple Live Births         0 3       Home Medications    Prior to Admission medications   Medication Sig Start Date End Date Taking? Authorizing Provider  albuterol (PROVENTIL HFA;VENTOLIN HFA) 108 (90 Base) MCG/ACT inhaler Inhale 2 puffs into the lungs every 6 (six) hours as needed for wheezing or shortness of breath. 09/28/15  Yes Costin Karlyne Greenspan, MD  budesonide (PULMICORT) 180 MCG/ACT inhaler Inhale 1 puff into the lungs 2 (two) times daily. 09/28/15  Yes Costin Karlyne Greenspan, MD  escitalopram (LEXAPRO) 10 MG tablet Take 10 mg by mouth daily.    Yes Historical Provider, MD  Norethindrone Acetate-Ethinyl Estradiol (MICROGESTIN) 1.5-30 MG-MCG tablet Take 1 tablet by mouth daily.   Yes Historical Provider, MD  predniSONE (DELTASONE) 10 MG tablet 4 tablets 3 days, 3 tablets 3 days, 2 tablets 3 days, 1 tablet 3 days Patient taking differently: Take 10-40 mg by mouth See admin instructions. Tapered course started 09/29/15: Take 4 tablets (40 mg) by mouth daily for 3 days, then take 3 tablets (30 mg) by mouth daily for 3 days, then take 2 tablets (20 mg) by mouth daily for 3 days, then take 1 tablet (10 mg) by mouth daily for 3 days, then stop 09/28/15  Yes Costin  Karlyne Greenspan, MD  albuterol (PROVENTIL) (2.5 MG/3ML) 0.083% nebulizer solution Take 3 mLs (2.5 mg total) by nebulization every 4 (four) hours as needed for wheezing or shortness of breath. 09/30/15   Hennie Gosa, PA-C  guaiFENesin-dextromethorphan (ROBITUSSIN DM) 100-10 MG/5ML syrup Take 5 mLs by mouth every 6 (six) hours as needed for cough. OK to dispense generic 09/28/15   Caren Griffins, MD    Family History Family History  Problem Relation Age of Onset  . Asthma Sister     Social History Social History  Substance Use Topics  . Smoking status: Former Research scientist (life sciences)  . Smokeless tobacco: Never Used  . Alcohol use No     Allergies   Penicillins   Review of  Systems Review of Systems  Ten systems are reviewed and are negative for acute change except as noted in the HPI  Physical Exam Updated Vital Signs BP 134/77   Pulse 84   Temp 99 F (37.2 C) (Oral)   Resp 16   SpO2 100%   Physical Exam  Constitutional: She is oriented to person, place, and time. She appears well-developed and well-nourished. No distress.  HENT:  Head: Normocephalic and atraumatic.  Eyes: Conjunctivae are normal. No scleral icterus.  Neck: Normal range of motion.  Cardiovascular: Normal rate, regular rhythm and normal heart sounds.  Exam reveals no gallop and no friction rub.   No murmur heard. Pulmonary/Chest: Effort normal. No respiratory distress. She has wheezes.  Abdominal: Soft. Bowel sounds are normal. She exhibits no distension and no mass. There is no tenderness. There is no guarding.  Neurological: She is alert and oriented to person, place, and time.  Skin: Skin is warm and dry. She is not diaphoretic.     ED Treatments / Results  Labs (all labs ordered are listed, but only abnormal results are displayed) Labs Reviewed  BASIC METABOLIC PANEL - Abnormal; Notable for the following:       Result Value   Chloride 100 (*)    Glucose, Bld 114 (*)    All other components within normal limits  CBC - Abnormal; Notable for the following:    WBC 20.8 (*)    Hemoglobin 9.8 (*)    HCT 31.7 (*)    MCH 24.9 (*)    Platelets 506 (*)    All other components within normal limits  URINALYSIS, ROUTINE W REFLEX MICROSCOPIC (NOT AT [facility]) - Abnormal; Notable for the following:    APPearance HAZY (*)    Hgb urine dipstick LARGE (*)    Leukocytes, UA TRACE (*)    All other components within normal limits  URINE MICROSCOPIC-ADD ON - Abnormal; Notable for the following:    Squamous Epithelial / LPF 0-5 (*)    Bacteria, UA RARE (*)    All other components within normal limits  I-STAT CG4 LACTIC ACID, ED - Abnormal; Notable for the following:    Lactic Acid,  Venous 2.83 (*)    All other components within normal limits  MONONUCLEOSIS SCREEN  I-STAT BETA HCG BLOOD, ED ([facility], [facility], [facility] ONLY)  I-STAT TROPOININ, ED  I-STAT CG4 LACTIC ACID, ED    EKG  EKG Interpretation  Date/Time:  Sunday September 30 2015 16:00:24 EDT Ventricular Rate:  92 PR Interval:  132 QRS Duration: 84 QT Interval:  340 QTC Calculation: 420 R Axis:   65 Text Interpretation:  Normal sinus rhythm Normal ECG Confirmed by ALLEN  MD, ANTHONY ([id number]) on 09/30/2015 6:57:47 PM  Radiology Ct Angio Chest Pe W And/or Wo Contrast  Result Date: 09/30/2015 CLINICAL DATA:  Acute onset of difficulty breathing and dyspnea on exertion. Initial encounter. EXAM: CT ANGIOGRAPHY CHEST WITH CONTRAST TECHNIQUE: Multidetector CT imaging of the chest was performed using the standard protocol during bolus administration of intravenous contrast. Multiplanar CT image reconstructions and MIPs were obtained to evaluate the vascular anatomy. CONTRAST:  56 mL of Isovue 370 IV contrast COMPARISON:  CTA of the chest performed 09/23/2015 FINDINGS: Cardiovascular: There is no evidence of significant pulmonary embolus. The heart is unremarkable in appearance. The thoracic aorta is grossly unremarkable. No calcific atherosclerotic disease is seen. The great vessels are within normal limits. Mediastinum/Nodes: The mediastinum is unremarkable in appearance. No mediastinal lymphadenopathy is seen. Trace pericardial fluid remains within normal limits. The visualized portions of the thyroid gland are unremarkable. No axillary lymphadenopathy is seen. Lungs/Pleura: Minimal bibasilar atelectasis is noted. The lungs are otherwise clear. No focal consolidation, pleural effusion or pneumothorax is seen. No masses are identified. Upper Abdomen: The visualized portions of the liver and spleen are unremarkable. The visualized portions of the pancreas and adrenal glands are within normal limits. Musculoskeletal: No acute osseous  abnormalities are identified. The visualized musculature is unremarkable in appearance. Review of the MIP images confirms the above findings. IMPRESSION: 1. No evidence of significant pulmonary embolus. 2. Minimal bibasilar atelectasis noted.  Lungs otherwise clear. Electronically Signed   By: Garald Balding M.D.   On: 09/30/2015 20:22    Procedures Procedures (including critical care time)  Medications Ordered in ED Medications  acetaminophen (TYLENOL) tablet 1,000 mg (1,000 mg Oral Given 09/30/15 1844)  ondansetron (ZOFRAN-ODT) disintegrating tablet 8 mg (8 mg Oral Given 09/30/15 1844)  ipratropium-albuterol (DUONEB) 0.5-2.5 (3) MG/3ML nebulizer solution 3 mL (3 mLs Nebulization Given 09/30/15 1844)  iopamidol (ISOVUE-370) 76 % injection (100 mLs  Contrast Given 09/30/15 2000)  sodium chloride 0.9 % bolus 1,000 mL (1,000 mLs Intravenous New Bag/Given 09/30/15 2053)     Initial Impression / Assessment and Plan / ED Course  I have reviewed the triage vital signs and the nursing notes.  Pertinent labs & imaging results that were available during my care of the patient were reviewed by me and considered in my medical decision making (see chart for details).  Clinical Course   Patient with elevation in her white blood cells, probably secondary to her Sterapred Dosepak. She has some hemoglobin in her urine without evidence of infection. Negative mono screen, repeat CT a shows no pulmonary embolus. The patient feels improved after treatment and fluids. Feel that she is dehydrated. I have advised the patient to drink half her body weight and ounces of fluid per day. Patient is follow-up with primary care physician. Continue his Sterapred Dosepak. Patient appears safe for discharge.   Final Clinical Impressions(s) / ED Diagnoses   Final diagnoses:  Viral infection  Dehydration    New Prescriptions New Prescriptions   ALBUTEROL (PROVENTIL) (2.5 MG/3ML) 0.083% NEBULIZER SOLUTION    Take 3 mLs  (2.5 mg total) by nebulization every 4 (four) hours as needed for wheezing or shortness of breath.     Margarita Mail, PA-C 09/30/15 2138    Margarita Mail, PA-C 09/30/15 2201    Lacretia Leigh, MD 10/08/15 [phone]

## 2015-09-30 NOTE — ED Notes (Signed)
Pt to CT

## 2015-09-30 NOTE — ED Provider Notes (Signed)
[facility] DEPT Provider Note   CSN: [id number] Arrival date & time: 09/30/15  1552     History   Chief Complaint Chief Complaint  Patient presents with  . Near Syncope  . Fatigue  . Shortness of Breath    HPI Kathy Flores is a 25 y.o. female who presents to the ED with cc of SOB and presyncope.  Patient was released 2 days ago after admission for Respiratory failure and Sirs, secondary to Rhinovirus infection.  The patient returns today with complaint of fatigue, malaise, dizziness. She has continued to have sig wheezing and sob both at rest and with exertion. She states that she cannot standfor longer that 2-3 minutes at a time before she has to lay down and frequently feels that she is going to pass out when she stands. She denies UL leg swelling, hemoptysis, orthopnea, PND. She has not had any edema or weight gain. She denies new fevers.  HPI  Past Medical History:  Diagnosis Date  . Asthma    inhaler used 3x month  . Depression   . Fibroid   . Trichomonas infection   . Vaginal Pap smear, abnormal     Patient Active Problem List   Diagnosis Date Noted  . Asthma exacerbation   . Metabolic acidosis   . Acute respiratory failure with hypoxemia ([place]) 09/23/2015  . SIRS (systemic inflammatory response syndrome) ([place]) 09/23/2015  . Normochromic normocytic anemia 09/23/2015  . Lactic acidosis   . Encounter for observation of infant for suspected infection   . Post term pregnancy, 41 weeks 11/22/2013  . Post-term pregnancy, 40-42 weeks of gestation   . [redacted] weeks gestation of pregnancy     Past Surgical History:  Procedure Laterality Date  . CESAREAN SECTION N/A 11/23/2013   Procedure: CESAREAN SECTION;  Surgeon: Woodroe Mode, MD;  Location: [facility];  Service: Obstetrics;  Laterality: N/A;  . NO PAST SURGERIES      OB History    Gravida Para Term Preterm AB Living   3 3 3     3    SAB TAB Ectopic Multiple Live Births         0 3       Home Medications     Prior to Admission medications   Medication Sig Start Date End Date Taking? Authorizing Provider  albuterol (PROVENTIL HFA;VENTOLIN HFA) 108 (90 Base) MCG/ACT inhaler Inhale 2 puffs into the lungs every 6 (six) hours as needed for wheezing or shortness of breath. 09/28/15  Yes Costin Karlyne Greenspan, MD  budesonide (PULMICORT) 180 MCG/ACT inhaler Inhale 1 puff into the lungs 2 (two) times daily. 09/28/15  Yes Costin Karlyne Greenspan, MD  escitalopram (LEXAPRO) 10 MG tablet Take 10 mg by mouth daily.    Yes Historical Provider, MD  Norethindrone Acetate-Ethinyl Estradiol (MICROGESTIN) 1.5-30 MG-MCG tablet Take 1 tablet by mouth daily.   Yes Historical Provider, MD  predniSONE (DELTASONE) 10 MG tablet 4 tablets 3 days, 3 tablets 3 days, 2 tablets 3 days, 1 tablet 3 days Patient taking differently: Take 10-40 mg by mouth See admin instructions. Tapered course started 09/29/15: Take 4 tablets (40 mg) by mouth daily for 3 days, then take 3 tablets (30 mg) by mouth daily for 3 days, then take 2 tablets (20 mg) by mouth daily for 3 days, then take 1 tablet (10 mg) by mouth daily for 3 days, then stop 09/28/15  Yes Costin Karlyne Greenspan, MD  albuterol (PROVENTIL) (2.5 MG/3ML) 0.083% nebulizer solution Take  3 mLs (2.5 mg total) by nebulization every 4 (four) hours as needed for wheezing or shortness of breath. 09/30/15   Esmay Amspacher, PA-C  guaiFENesin-dextromethorphan (ROBITUSSIN DM) 100-10 MG/5ML syrup Take 5 mLs by mouth every 6 (six) hours as needed for cough. OK to dispense generic 09/28/15   Caren Griffins, MD    Family History Family History  Problem Relation Age of Onset  . Asthma Sister     Social History Social History  Substance Use Topics  . Smoking status: Former Research scientist (life sciences)  . Smokeless tobacco: Never Used  . Alcohol use No     Allergies   Penicillins   Review of Systems Review of Systems Ten systems reviewed and are negative for acute change, except as noted in the HPI.   ROS  Physical  Exam Updated Vital Signs BP 134/77   Pulse 84   Temp 99 F (37.2 C) (Oral)   Resp 16   SpO2 100%   Physical Exam  Constitutional: She is oriented to person, place, and time. She appears well-developed and well-nourished. No distress.  Glassy eyes, appears ill. NAD  HENT:  Head: Normocephalic and atraumatic.  Dry oral mucosa  Eyes: Conjunctivae are normal. No scleral icterus.  Neck: Normal range of motion.  Cardiovascular: Normal rate, regular rhythm and normal heart sounds.  Exam reveals no gallop and no friction rub.   No murmur heard. Pulmonary/Chest: Effort normal. No respiratory distress. She has wheezes.  No dyspnea. Speaking in full sentences  Abdominal: Soft. Bowel sounds are normal. She exhibits no distension and no mass. There is no tenderness. There is no guarding.  Neurological: She is alert and oriented to person, place, and time.  Skin: Skin is warm and dry. She is not diaphoretic.     ED Treatments / Results  Labs (all labs ordered are listed, but only abnormal results are displayed) Labs Reviewed  BASIC METABOLIC PANEL - Abnormal; Notable for the following:       Result Value   Chloride 100 (*)    Glucose, Bld 114 (*)    All other components within normal limits  CBC - Abnormal; Notable for the following:    WBC 20.8 (*)    Hemoglobin 9.8 (*)    HCT 31.7 (*)    MCH 24.9 (*)    Platelets 506 (*)    All other components within normal limits  URINALYSIS, ROUTINE W REFLEX MICROSCOPIC (NOT AT [facility]) - Abnormal; Notable for the following:    APPearance HAZY (*)    Hgb urine dipstick LARGE (*)    Leukocytes, UA TRACE (*)    All other components within normal limits  URINE MICROSCOPIC-ADD ON - Abnormal; Notable for the following:    Squamous Epithelial / LPF 0-5 (*)    Bacteria, UA RARE (*)    All other components within normal limits  I-STAT CG4 LACTIC ACID, ED - Abnormal; Notable for the following:    Lactic Acid, Venous 2.83 (*)    All other components  within normal limits  MONONUCLEOSIS SCREEN  I-STAT BETA HCG BLOOD, ED ([facility], [facility], [facility] ONLY)  I-STAT TROPOININ, ED  I-STAT CG4 LACTIC ACID, ED    EKG  EKG Interpretation  Date/Time:  Sunday September 30 2015 16:00:24 EDT Ventricular Rate:  92 PR Interval:  132 QRS Duration: 84 QT Interval:  340 QTC Calculation: 420 R Axis:   65 Text Interpretation:  Normal sinus rhythm Normal ECG Confirmed by ALLEN  MD, ANTHONY ([id number]) on 09/30/2015  6:57:47 PM       Radiology No results found.  Procedures Procedures (including critical care time)  Medications Ordered in ED Medications  acetaminophen (TYLENOL) tablet 1,000 mg (1,000 mg Oral Given 09/30/15 1844)  ondansetron (ZOFRAN-ODT) disintegrating tablet 8 mg (8 mg Oral Given 09/30/15 1844)  ipratropium-albuterol (DUONEB) 0.5-2.5 (3) MG/3ML nebulizer solution 3 mL (3 mLs Nebulization Given 09/30/15 1844)  iopamidol (ISOVUE-370) 76 % injection (100 mLs  Contrast Given 09/30/15 2000)  sodium chloride 0.9 % bolus 1,000 mL (0 mLs Intravenous Stopped 09/30/15 2201)     Initial Impression / Assessment and Plan / ED Course  I have reviewed the triage vital signs and the nursing notes.  Pertinent labs & imaging results that were available during my care of the patient were reviewed by me and considered in my medical decision making (see chart for details).  Clinical Course  Patient white count has increased markedly. I feel this si likely due to steroid effect. The patient's CT scan is negative for PE or other acute abnormality.She appears improved after fluids, Neb treatment.She has no hypoxia throughout the visit. She appears safe for discharge at this time   Final Clinical Impressions(s) / ED Diagnoses   Final diagnoses:  Viral infection  Dehydration    New Prescriptions Discharge Medication List as of 09/30/2015  9:44 PM    START taking these medications   Details  albuterol (PROVENTIL) (2.5 MG/3ML) 0.083% nebulizer solution Take 3 mLs  (2.5 mg total) by nebulization every 4 (four) hours as needed for wheezing or shortness of breath., Starting Sun 09/30/2015, Print         Margarita Mail, PA-C 10/04/15 1249    Lacretia Leigh, MD 10/08/15 1911

## 2015-09-30 NOTE — ED Notes (Signed)
Dr. Reather Converse informed of patients elevated lactic acid of 2.83. No new orders at this time.

## 2015-09-30 NOTE — ED Triage Notes (Addendum)
Pt reports recent hospital admission on 9/17 due to sob/wheezing/sepsis. Pt here today due to fatigue, feeling lightheaded, swelling to hands and sob. ekg done at triage, airway intact.

## 2015-09-30 NOTE — ED Notes (Signed)
Pt already voided

## 2015-10-18 ENCOUNTER — Encounter (HOSPITAL_COMMUNITY): Payer: Self-pay | Admitting: Emergency Medicine

## 2015-10-18 ENCOUNTER — Emergency Department (HOSPITAL_COMMUNITY): Payer: Medicaid Other

## 2015-10-18 ENCOUNTER — Emergency Department (HOSPITAL_COMMUNITY)
Admission: EM | Admit: 2015-10-18 | Discharge: 2015-10-18 | Disposition: A | Discharge status: Home / Self Care | Payer: Medicaid Other | Attending: Emergency Medicine | Admitting: Emergency Medicine

## 2015-10-18 DIAGNOSIS — J45909 Unspecified asthma, uncomplicated: Secondary | ICD-10-CM | POA: Insufficient documentation

## 2015-10-18 DIAGNOSIS — Z87891 Personal history of nicotine dependence: Secondary | ICD-10-CM | POA: Diagnosis not present

## 2015-10-18 DIAGNOSIS — R002 Palpitations: Secondary | ICD-10-CM | POA: Diagnosis not present

## 2015-10-18 LAB — CBC
HEMATOCRIT: 31.3 % — AB (ref 36.0–46.0)
Hemoglobin: 9.6 g/dL — ABNORMAL LOW (ref 12.0–15.0)
MCH: 24.4 pg — ABNORMAL LOW (ref 26.0–34.0)
MCHC: 30.7 g/dL (ref 30.0–36.0)
MCV: 79.4 fL (ref 78.0–100.0)
PLATELETS: 382 10*3/uL (ref 150–400)
RBC: 3.94 MIL/uL (ref 3.87–5.11)
RDW: 16.3 % — AB (ref 11.5–15.5)
WBC: 9.5 10*3/uL (ref 4.0–10.5)

## 2015-10-18 LAB — BASIC METABOLIC PANEL
Anion gap: 10 (ref 5–15)
BUN: 9 mg/dL (ref 6–20)
CHLORIDE: 105 mmol/L (ref 101–111)
CO2: 21 mmol/L — ABNORMAL LOW (ref 22–32)
Calcium: 9.1 mg/dL (ref 8.9–10.3)
Creatinine, Ser: 0.75 mg/dL (ref 0.44–1.00)
Glucose, Bld: 87 mg/dL (ref 65–99)
POTASSIUM: 3.6 mmol/L (ref 3.5–5.1)
SODIUM: 136 mmol/L (ref 135–145)

## 2015-10-18 LAB — CBG MONITORING, ED: GLUCOSE-CAPILLARY: 76 mg/dL (ref 65–99)

## 2015-10-18 LAB — TSH: TSH: 0.259 u[IU]/mL — AB (ref 0.350–4.500)

## 2015-10-18 LAB — I-STAT TROPONIN, ED: Troponin i, poc: 0 ng/mL (ref 0.00–0.08)

## 2015-10-18 NOTE — ED Provider Notes (Signed)
[facility] DEPT Provider Note   CSN: [id number] Arrival date & time: 10/18/15  1221     History   Chief Complaint Chief Complaint  Patient presents with  . Palpitations    HPI Kathy Flores is a 25 y.o. female.  She presents after an episode at work where she felt shaky. Her hands were shaky. Her [place] her heart was racing. It seemed to improve. She normally today bridge she's not been ill. She was discharged after recent admission for rhinovirus sepsis. She been doing well without cough. She is on no home medications. History of thyroid or heart problems. States she feels better now. She is actually eating here in the emergency room.  Telemetry she panicked or had anxiety attack oh she states after symptoms started she did get anxious. No history of hypothyroidism diabetes.    HPI  Past Medical History:  Diagnosis Date  . Asthma    inhaler used 3x month  . Depression   . Fibroid   . Trichomonas infection   . Vaginal Pap smear, abnormal     Patient Active Problem List   Diagnosis Date Noted  . Asthma exacerbation   . Metabolic acidosis   . Acute respiratory failure with hypoxemia ([place]) 09/23/2015  . SIRS (systemic inflammatory response syndrome) ([place]) 09/23/2015  . Normochromic normocytic anemia 09/23/2015  . Lactic acidosis   . Encounter for observation of infant for suspected infection   . Post term pregnancy, 41 weeks 11/22/2013  . Post-term pregnancy, 40-42 weeks of gestation   . [redacted] weeks gestation of pregnancy     Past Surgical History:  Procedure Laterality Date  . CESAREAN SECTION N/A 11/23/2013   Procedure: CESAREAN SECTION;  Surgeon: Woodroe Mode, MD;  Location: [facility];  Service: Obstetrics;  Laterality: N/A;  . NO PAST SURGERIES      OB History    Gravida Para Term Preterm AB Living   3 3 3     3    SAB TAB Ectopic Multiple Live Births         0 3       Home Medications    Prior to Admission medications   Medication Sig Start  Date End Date Taking? Authorizing Provider  albuterol (PROVENTIL HFA;VENTOLIN HFA) 108 (90 Base) MCG/ACT inhaler Inhale 2 puffs into the lungs every 6 (six) hours as needed for wheezing or shortness of breath. 09/28/15  Yes Costin Karlyne Greenspan, MD  albuterol (PROVENTIL) (2.5 MG/3ML) 0.083% nebulizer solution Take 3 mLs (2.5 mg total) by nebulization every 4 (four) hours as needed for wheezing or shortness of breath. 09/30/15  Yes Abigail Harris, PA-C  budesonide (PULMICORT) 180 MCG/ACT inhaler Inhale 1 puff into the lungs 2 (two) times daily. 09/28/15  Yes Costin Karlyne Greenspan, MD  escitalopram (LEXAPRO) 10 MG tablet Take 10 mg by mouth daily.    Yes Historical Provider, MD  Norethindrone Acetate-Ethinyl Estradiol (MICROGESTIN) 1.5-30 MG-MCG tablet Take 1 tablet by mouth daily.   Yes Historical Provider, MD  guaiFENesin-dextromethorphan (ROBITUSSIN DM) 100-10 MG/5ML syrup Take 5 mLs by mouth every 6 (six) hours as needed for cough. OK to dispense generic Patient not taking: Reported on 10/18/2015 09/28/15   Caren Griffins, MD  predniSONE (DELTASONE) 10 MG tablet 4 tablets 3 days, 3 tablets 3 days, 2 tablets 3 days, 1 tablet 3 days Patient not taking: Reported on 10/18/2015 09/28/15   Caren Griffins, MD    Family History Family History  Problem Relation Age of Onset  .  Asthma Sister     Social History Social History  Substance Use Topics  . Smoking status: Former Research scientist (life sciences)  . Smokeless tobacco: Never Used  . Alcohol use No     Allergies   Penicillins   Review of Systems Review of Systems  Constitutional: Negative for appetite change, chills, diaphoresis, fatigue and fever.  HENT: Negative for mouth sores, sore throat and trouble swallowing.   Eyes: Negative for visual disturbance.  Respiratory: Negative for cough, chest tightness, shortness of breath and wheezing.   Cardiovascular: Positive for palpitations. Negative for chest pain.  Gastrointestinal: Negative for abdominal distention,  abdominal pain, diarrhea, nausea and vomiting.  Endocrine: Negative for polydipsia, polyphagia and polyuria.  Genitourinary: Negative for dysuria, frequency and hematuria.  Musculoskeletal: Negative for gait problem.  Skin: Negative for color change, pallor and rash.  Neurological: Positive for tremors. Negative for dizziness, syncope, light-headedness and headaches.  Hematological: Does not bruise/bleed easily.  Psychiatric/Behavioral: Negative for behavioral problems and confusion.     Physical Exam Updated Vital Signs BP 137/67   Pulse 78   Temp 98.6 F (37 C) (Oral)   Resp 18   Ht 5\' 1"  (1.549 m)   Wt 216 lb (98 kg)   LMP 10/05/2015   SpO2 (!) 10%   BMI 40.81 kg/m   Physical Exam  Constitutional: She is oriented to person, place, and time. She appears well-developed and well-nourished. No distress.  HENT:  Head: Normocephalic.  Eyes: Conjunctivae are normal. Pupils are equal, round, and reactive to light. No scleral icterus.  Neck: Normal range of motion. Neck supple. No thyromegaly present.  Cardiovascular: Normal rate and regular rhythm.  Exam reveals no gallop and no friction rub.   No murmur heard. Pulmonary/Chest: Effort normal and breath sounds normal. No respiratory distress. She has no wheezes. She has no rales.  Abdominal: Soft. Bowel sounds are normal. She exhibits no distension. There is no tenderness. There is no rebound.  Musculoskeletal: Normal range of motion.  Neurological: She is alert and oriented to person, place, and time.  Skin: Skin is warm and dry. No rash noted.  Psychiatric: She has a normal mood and affect. Her behavior is normal.     ED Treatments / Results  Labs (all labs ordered are listed, but only abnormal results are displayed) Labs Reviewed  BASIC METABOLIC PANEL - Abnormal; Notable for the following:       Result Value   CO2 21 (*)    All other components within normal limits  CBC - Abnormal; Notable for the following:     Hemoglobin 9.6 (*)    HCT 31.3 (*)    MCH 24.4 (*)    RDW 16.3 (*)    All other components within normal limits  TSH  I-STAT TROPOININ, ED  CBG MONITORING, ED  CBG MONITORING, ED    EKG  EKG Interpretation  Date/Time:  Thursday October 18 2015 12:32:01 EDT Ventricular Rate:  84 PR Interval:  146 QRS Duration: 80 QT Interval:  376 QTC Calculation: 444 R Axis:   68 Text Interpretation:  Normal sinus rhythm Normal ECG Confirmed by Jeneen Rinks  MD, McIntosh ([id number]) on 10/18/2015 2:22:25 PM       Radiology Dg Chest 2 View  Result Date: 10/18/2015 CLINICAL DATA:  Shortness of breath for 2 days EXAM: CHEST  2 VIEW COMPARISON:  09/23/2015 FINDINGS: Prominence of the main pulmonary artery, which measured 33 mm in diameter on recent chest CT. There is no edema, consolidation, effusion, or  pneumothorax. Normal heart size and aortic contours. IMPRESSION: 1. No acute finding. 2. Prominence of the main pulmonary artery today and by recent CTA. Consider follow-up echocardiogram to exclude pulmonary hypertension. Electronically Signed   By: Monte Fantasia M.D.   On: 10/18/2015 12:45    Procedures Procedures (including critical care time)  Medications Ordered in ED Medications - No data to display   Initial Impression / Assessment and Plan / ED Course  I have reviewed the triage vital signs and the nursing notes.  Pertinent labs & imaging results that were available during my care of the patient were reviewed by me and considered in my medical decision making (see chart for details).  Clinical Course    EKG without acute abnormality is no ectopy. Tyroid similarly hormone level obtained and pending. Recheck blood sugar 76. Is taking by mouth here.  Pus was "anxiety. Hypothyroidism. Doubt SVT or arrhythmia. She does admit that after recent episode with her viral sepsis that she became anxious after symptoms happened. Think she is appropriate for discharge. She returned here with any new or  worsening symptoms.  Final Clinical Impressions(s) / ED Diagnoses   Final diagnoses:  Palpitations    New Prescriptions New Prescriptions   No medications on file     Tanna Furry, MD 10/18/15 1517

## 2015-10-18 NOTE — Discharge Instructions (Signed)
Follow-up with primary care as needed.   Return to your with any new or worsening symptoms.

## 2015-10-18 NOTE — ED Notes (Signed)
Patient able to ambulate independently  

## 2015-10-18 NOTE — ED Triage Notes (Signed)
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 Pt arrives to ed warm and dry in no acute distress. HR 89 sinus on arrival. Denies chest pain.

## 2015-10-28 ENCOUNTER — Ambulatory Visit (HOSPITAL_COMMUNITY)
Admission: EM | Admit: 2015-10-28 | Discharge: 2015-10-28 | Disposition: A | Discharge status: Home / Self Care | Payer: Managed Care, Other (non HMO) | Attending: Family Medicine | Admitting: Family Medicine

## 2015-10-28 ENCOUNTER — Encounter (HOSPITAL_COMMUNITY): Payer: Self-pay | Admitting: Emergency Medicine

## 2015-10-28 DIAGNOSIS — N926 Irregular menstruation, unspecified: Secondary | ICD-10-CM | POA: Insufficient documentation

## 2015-10-28 DIAGNOSIS — N761 Subacute and chronic vaginitis: Secondary | ICD-10-CM | POA: Diagnosis not present

## 2015-10-28 DIAGNOSIS — B9689 Other specified bacterial agents as the cause of diseases classified elsewhere: Secondary | ICD-10-CM | POA: Insufficient documentation

## 2015-10-28 DIAGNOSIS — Z87891 Personal history of nicotine dependence: Secondary | ICD-10-CM | POA: Insufficient documentation

## 2015-10-28 DIAGNOSIS — Z79899 Other long term (current) drug therapy: Secondary | ICD-10-CM | POA: Diagnosis not present

## 2015-10-28 DIAGNOSIS — N760 Acute vaginitis: Secondary | ICD-10-CM | POA: Diagnosis not present

## 2015-10-28 DIAGNOSIS — N898 Other specified noninflammatory disorders of vagina: Secondary | ICD-10-CM | POA: Diagnosis present

## 2015-10-28 LAB — POCT URINALYSIS DIP (DEVICE)
Bilirubin Urine: NEGATIVE
Glucose, UA: NEGATIVE mg/dL
Ketones, ur: NEGATIVE mg/dL
NITRITE: NEGATIVE
PH: 6 (ref 5.0–8.0)
PROTEIN: NEGATIVE mg/dL
Specific Gravity, Urine: 1.025 (ref 1.005–1.030)
UROBILINOGEN UA: 0.2 mg/dL (ref 0.0–1.0)

## 2015-10-28 LAB — POCT PREGNANCY, URINE: PREG TEST UR: NEGATIVE

## 2015-10-28 MED ORDER — METRONIDAZOLE 500 MG PO TABS: 500.0000 mg | ORAL_TABLET | Freq: Two times a day (BID) | ORAL | 0 refills | Status: AC

## 2015-10-28 MED ORDER — FLUCONAZOLE 150 MG PO TABS: 150.0000 mg | ORAL_TABLET | Freq: Once | ORAL | 0 refills | Status: AC

## 2015-10-28 NOTE — Discharge Instructions (Signed)
Recommend start Flagyl twice a day for 7 days. Do not drink alcohol while on medication. Take Diflucan 150mg  one tablet now, repeat 1 tablet in 3 days. No sexual intercourse for 2 weeks. Follow-up pending lab results.

## 2015-10-28 NOTE — ED Triage Notes (Signed)
Patient has multiple concerns.  Patient requested female provider on arrival and this has played into the delay in getting to a treatment room.    Patient reports vaginal discharge, itching, irritation, frequency and low back pain.    Patient is requesting a pregnancy test since periods are irregular and does not take bcp as prescribed( intended to "regulate" hormones).    Patient also wants "sugar" and blood pressure evaluation.  Patient has a new pcp, first appt is next month.  Attempted to explain to patient that the pcp is the one to best handle some concerns, especially for medical work up of health problems and chronic management

## 2015-10-29 LAB — CERVICOVAGINAL ANCILLARY ONLY
Chlamydia: NEGATIVE
NEISSERIA GONORRHEA: NEGATIVE

## 2015-10-29 NOTE — ED Provider Notes (Signed)
CSN: [id number]     Arrival date & time 10/28/15  1614 History   First MD Initiated Contact with Patient 10/28/15 1905     Chief Complaint  Patient presents with  . Vaginal Discharge   (Consider location/radiation/quality/duration/timing/severity/associated sxs/prior Treatment) 25 year old female presents with vaginal discharge, itching and odor for the past 4 days. Denies any fever, urinary symptoms or bleeding. She has used Vagisil for the past 2 days with some relief. She was seen here about 3 months ago for similar symptoms and dx with Trichomonas. She took Flagyl at that time and symptoms resolved. She also requests a pregnancy test since she does not take her BCP's routinely and has irregular periods.  She has other concerns such as routine sugar level checked and chronic back pain evaluation that she understands will need to be addressed by her PCP.       Past Medical History:  Diagnosis Date  . Asthma    inhaler used 3x month  . Depression   . Fibroid   . Trichomonas infection   . Vaginal Pap smear, abnormal    Past Surgical History:  Procedure Laterality Date  . CESAREAN SECTION N/A 11/23/2013   Procedure: CESAREAN SECTION;  Surgeon: Woodroe Mode, MD;  Location: [facility];  Service: Obstetrics;  Laterality: N/A;  . NO PAST SURGERIES     Family History  Problem Relation Age of Onset  . Asthma Sister    Social History  Substance Use Topics  . Smoking status: Former Research scientist (life sciences)  . Smokeless tobacco: Never Used  . Alcohol use No   OB History    Gravida Para Term Preterm AB Living   3 3 3     3    SAB TAB Ectopic Multiple Live Births         0 3     Review of Systems  Constitutional: Negative for chills, fatigue and fever.  Cardiovascular: Negative for chest pain.  Gastrointestinal: Negative for abdominal pain, diarrhea, nausea and vomiting.  Genitourinary: Positive for menstrual problem and vaginal discharge. Negative for difficulty urinating, dysuria, flank pain,  genital sores, hematuria, pelvic pain, vaginal bleeding and vaginal pain.  Musculoskeletal: Positive for back pain.  Skin: Negative for rash and wound.  Neurological: Negative for dizziness, weakness and headaches.    Allergies  Penicillins  Home Medications   Prior to Admission medications   Medication Sig Start Date End Date Taking? Authorizing Provider  albuterol (PROVENTIL HFA;VENTOLIN HFA) 108 (90 Base) MCG/ACT inhaler Inhale 2 puffs into the lungs every 6 (six) hours as needed for wheezing or shortness of breath. 09/28/15   Costin Karlyne Greenspan, MD  albuterol (PROVENTIL) (2.5 MG/3ML) 0.083% nebulizer solution Take 3 mLs (2.5 mg total) by nebulization every 4 (four) hours as needed for wheezing or shortness of breath. 09/30/15   Abigail Harris, PA-C  budesonide (PULMICORT) 180 MCG/ACT inhaler Inhale 1 puff into the lungs 2 (two) times daily. 09/28/15   Costin Karlyne Greenspan, MD  escitalopram (LEXAPRO) 10 MG tablet Take 10 mg by mouth daily.     Historical Provider, MD  metroNIDAZOLE (FLAGYL) 500 MG tablet Take 1 tablet (500 mg total) by mouth 2 (two) times daily. 10/28/15 11/04/15  Katy Apo, NP  Norethindrone Acetate-Ethinyl Estradiol (MICROGESTIN) 1.5-30 MG-MCG tablet Take 1 tablet by mouth daily.    Historical Provider, MD   Meds Ordered and Administered this Visit  Medications - No data to display  BP 122/66 (BP Location: Left Arm)   Pulse  73   Temp 98.7 F (37.1 C) (Oral)   Resp 18   LMP 10/05/2015   SpO2 100%  No data found.   Physical Exam  Constitutional: She is oriented to person, place, and time. She appears well-developed and well-nourished. No distress.  Cardiovascular: Normal rate, regular rhythm and normal heart sounds.   Pulmonary/Chest: Effort normal and breath sounds normal.  Abdominal: Soft. Bowel sounds are normal. There is no hepatosplenomegaly. There is no tenderness. There is no rigidity, no rebound, no guarding and no CVA tenderness.  Genitourinary: Uterus  normal. There is no rash, tenderness, lesion or injury on the right labia. There is no rash, tenderness, lesion or injury on the left labia. Cervix exhibits friability. Cervix exhibits no motion tenderness. Right adnexum displays no mass and no tenderness. Left adnexum displays no mass and no tenderness. There is tenderness in the vagina. No erythema or bleeding in the vagina. Vaginal discharge found.  Genitourinary Comments: Thin white to gray discharge present with foul odor.   Lymphadenopathy:       Right: No inguinal adenopathy present.       Left: No inguinal adenopathy present.  Neurological: She is alert and oriented to person, place, and time.  Skin: Skin is warm and dry.  Psychiatric: She has a normal mood and affect. Her behavior is normal. Judgment and thought content normal.    Urgent Care Course   Clinical Course    Procedures (including critical care time)  Labs Review Labs Reviewed  POCT URINALYSIS DIP (DEVICE) - Abnormal; Notable for the following:       Result Value   Hgb urine dipstick TRACE (*)    Leukocytes, UA LARGE (*)    All other components within normal limits  POCT PREGNANCY, URINE  CERVICOVAGINAL ANCILLARY ONLY    Imaging Review No results found.   Visual Acuity Review  Right Eye Distance:   Left Eye Distance:   Bilateral Distance:    Right Eye Near:   Left Eye Near:    Bilateral Near:         MDM   1. BV (bacterial vaginosis)   2. Chronic vaginitis    Discussed urinalysis results- reviewed that WBC's and Hgb can be found in urine from vaginal infections- does not appear to be a UTI.  Urine pregnancy test was negative. Encourage to take BCP's as directed. Discussed that she probably has BV but may also still have Trich or other infections. Specimen sent for testing. Start Flagyl 500mg  twice a day for 7 days. May take Diflucan 150mg  one time now and repeat 1 tablet in 3 days. NO intercourse for 2 weeks. Follow-up pending lab results and  follow-up with a PCP for additional concerns as recommended.     Katy Apo, NP 10/29/15 1012

## 2015-10-30 LAB — CERVICOVAGINAL ANCILLARY ONLY: Wet Prep (BD Affirm): POSITIVE — AB

## 2015-11-12 ENCOUNTER — Telehealth (HOSPITAL_COMMUNITY): Payer: Self-pay | Admitting: Emergency Medicine

## 2015-11-12 NOTE — Telephone Encounter (Signed)
Called [phone]... Female answered and hanged up/line was cut off.   Just calling to f/u on visit from 10/28/15 and make sure pt finished and tolerated well meds.   Notes Recorded by Sherlene Shams, MD on 10/31/2015 at 4:06 PM EDT Tests for gardnerella (bacterial vaginosis) and candida (yeast) were positive.  Prescriptions for metronidazole and fluconazole were given at the urgent care visit 10/28/15.  Finish these medicines.  Recheck or followup with primary care provider for further evaluation if symptoms persist.  Result note copied to patient's MyChart. Jodi Mourning MD

## 2015-12-24 ENCOUNTER — Emergency Department (HOSPITAL_COMMUNITY): Payer: Medicaid Other

## 2015-12-24 ENCOUNTER — Encounter (HOSPITAL_COMMUNITY): Payer: Self-pay | Admitting: Emergency Medicine

## 2015-12-24 ENCOUNTER — Emergency Department (HOSPITAL_COMMUNITY)
Admission: EM | Admit: 2015-12-24 | Discharge: 2015-12-25 | Disposition: A | Discharge status: Home / Self Care | Payer: Medicaid Other | Attending: Emergency Medicine | Admitting: Emergency Medicine

## 2015-12-24 DIAGNOSIS — J45909 Unspecified asthma, uncomplicated: Secondary | ICD-10-CM | POA: Insufficient documentation

## 2015-12-24 DIAGNOSIS — R509 Fever, unspecified: Secondary | ICD-10-CM | POA: Diagnosis present

## 2015-12-24 DIAGNOSIS — Z79899 Other long term (current) drug therapy: Secondary | ICD-10-CM | POA: Insufficient documentation

## 2015-12-24 DIAGNOSIS — Z87891 Personal history of nicotine dependence: Secondary | ICD-10-CM | POA: Insufficient documentation

## 2015-12-24 DIAGNOSIS — J069 Acute upper respiratory infection, unspecified: Secondary | ICD-10-CM

## 2015-12-24 LAB — RAPID STREP SCREEN (MED CTR MEBANE ONLY): Streptococcus, Group A Screen (Direct): NEGATIVE

## 2015-12-24 LAB — CBG MONITORING, ED: GLUCOSE-CAPILLARY: 65 mg/dL (ref 65–99)

## 2015-12-24 MED ORDER — LIDOCAINE VISCOUS 2 % MT SOLN
15.0000 mL | Freq: Once | OROMUCOSAL | Status: AC
  Administered 2015-12-25: 15 mL via OROMUCOSAL
  Filled 2015-12-24: qty 15

## 2015-12-24 MED ORDER — ONDANSETRON 8 MG PO TBDP
8.0000 mg | ORAL_TABLET | Freq: Once | ORAL | Status: DC
  Filled 2015-12-24: qty 1

## 2015-12-24 MED ORDER — ALBUTEROL SULFATE (2.5 MG/3ML) 0.083% IN NEBU
5.0000 mg | INHALATION_SOLUTION | Freq: Once | RESPIRATORY_TRACT | Status: AC
  Administered 2015-12-25: 5 mg via RESPIRATORY_TRACT
  Filled 2015-12-24: qty 6

## 2015-12-24 MED ORDER — SODIUM CHLORIDE 0.9 % IV BOLUS (SEPSIS)
1000.0000 mL | Freq: Once | INTRAVENOUS | Status: AC
  Administered 2015-12-24: 1000 mL via INTRAVENOUS

## 2015-12-24 MED ORDER — IBUPROFEN 200 MG PO TABS
600.0000 mg | ORAL_TABLET | Freq: Once | ORAL | Status: AC
  Administered 2015-12-24: 600 mg via ORAL
  Filled 2015-12-24: qty 3

## 2015-12-24 MED ORDER — ACETAMINOPHEN 325 MG PO TABS
650.0000 mg | ORAL_TABLET | Freq: Once | ORAL | Status: AC
  Administered 2015-12-24: 650 mg via ORAL
  Filled 2015-12-24: qty 2

## 2015-12-24 NOTE — ED Triage Notes (Signed)
Patient c/o body aches, sore throat, nausea patient denies vomiting and feeling feverish since yesterday. Patient denies taking any medication for her symptoms.

## 2015-12-24 NOTE — ED Provider Notes (Signed)
[facility] DEPT Provider Note   CSN: [id number] Arrival date & time: 12/24/15  1922     History   Chief Complaint Chief Complaint  Patient presents with  . Sore Throat  . Generalized Body Aches    HPI Kathy Flores is a 25 y.o. female.  Kathy Flores is a 25 y.o. female with h/o asthma and anemia presents to ED with complaint of body aches, sore throat, and feverish since yesterday. Pt did not check temperature but has felt "hot and cold." She states it is painful to swallow; however, she is able to swallow and managing her oral secretions. Complains of associated ear pain, lightheadedness, and "wet" cough however, unable to cough anything up. She denies nasal congestion, sinus pressure, eye discharge, photophobia, visual disturbances, shortness of  Breath, wheezing, chest pain, abdominal pain, N/V/D, dysuria, hematuria, neck pain, rash, LOC, numbness, weakness. No treatments tried PTA. Possible sick contacts.      Past Medical History:  Diagnosis Date  . Asthma    inhaler used 3x month  . Depression   . Fibroid   . Trichomonas infection   . Vaginal Pap smear, abnormal     Patient Active Problem List   Diagnosis Date Noted  . Asthma exacerbation   . Metabolic acidosis   . Acute respiratory failure with hypoxemia ([place]) 09/23/2015  . SIRS (systemic inflammatory response syndrome) ([place]) 09/23/2015  . Normochromic normocytic anemia 09/23/2015  . Lactic acidosis   . Encounter for observation of infant for suspected infection   . Post term pregnancy, 41 weeks 11/22/2013  . Post-term pregnancy, 40-42 weeks of gestation   . [redacted] weeks gestation of pregnancy     Past Surgical History:  Procedure Laterality Date  . CESAREAN SECTION N/A 11/23/2013   Procedure: CESAREAN SECTION;  Surgeon: Woodroe Mode, MD;  Location: [facility];  Service: Obstetrics;  Laterality: N/A;  . NO PAST SURGERIES      OB History    Gravida Para Term Preterm AB Living   3 3 3     3    SAB TAB Ectopic Multiple Live Births         0 3       Home Medications    Prior to Admission medications   Medication Sig Start Date End Date Taking? Authorizing Provider  albuterol (PROVENTIL HFA;VENTOLIN HFA) 108 (90 Base) MCG/ACT inhaler Inhale 2 puffs into the lungs every 6 (six) hours as needed for wheezing or shortness of breath. 09/28/15   Costin Karlyne Greenspan, MD  albuterol (PROVENTIL) (2.5 MG/3ML) 0.083% nebulizer solution Take 3 mLs (2.5 mg total) by nebulization every 4 (four) hours as needed for wheezing or shortness of breath. 09/30/15   Margarita Mail, PA-C  benzonatate (TESSALON) 100 MG capsule Take 1 capsule (100 mg total) by mouth every 8 (eight) hours. 12/25/15   Roxanna Mew, PA-C  budesonide (PULMICORT) 180 MCG/ACT inhaler Inhale 1 puff into the lungs 2 (two) times daily. 09/28/15   Costin Karlyne Greenspan, MD  escitalopram (LEXAPRO) 10 MG tablet Take 10 mg by mouth daily.     Historical Provider, MD  lidocaine (XYLOCAINE) 2 % solution Use as directed 15 mLs in the mouth or throat every 4 (four) hours as needed for mouth pain. 12/25/15   Roxanna Mew, PA-C  Norethindrone Acetate-Ethinyl Estradiol (MICROGESTIN) 1.5-30 MG-MCG tablet Take 1 tablet by mouth daily.    Historical Provider, MD    Family History Family History  Problem Relation Age of Onset  .  Asthma Sister     Social History Social History  Substance Use Topics  . Smoking status: Former Research scientist (life sciences)  . Smokeless tobacco: Never Used  . Alcohol use No     Allergies   Penicillins   Review of Systems Review of Systems  Constitutional: Positive for chills, diaphoresis and fever.  HENT: Positive for ear pain and sore throat. Negative for congestion, rhinorrhea, sinus pressure and trouble swallowing.   Eyes: Negative for photophobia, discharge and visual disturbance.  Respiratory: Positive for cough. Negative for shortness of breath and wheezing.   Cardiovascular: Negative for chest pain.    Gastrointestinal: Negative for abdominal pain, nausea and vomiting.  Genitourinary: Negative for dysuria and hematuria.  Musculoskeletal: Positive for myalgias. Negative for neck pain.  Skin: Negative for rash.  Neurological: Positive for light-headedness. Negative for syncope, weakness and numbness.     Physical Exam Updated Vital Signs BP 133/91 (BP Location: Right Arm)   Pulse 100   Temp 100 F (37.8 C) (Oral)   Resp 18   LMP 12/10/2015   SpO2 100%   Physical Exam  Constitutional: She appears well-developed and well-nourished. No distress.  HENT:  Head: Normocephalic and atraumatic.  Right Ear: Tympanic membrane, external ear and ear canal normal.  Left Ear: Tympanic membrane, external ear and ear canal normal.  Nose: Nose normal.  Mouth/Throat: Uvula is midline and mucous membranes are normal. No trismus in the jaw. No uvula swelling. Posterior oropharyngeal erythema present. No oropharyngeal exudate. No tonsillar exudate.  No trismus. Uvula midline and rises symmetrically. Mild posterior oropharynx erythema. No tonsillar hypertrophy or exudate. Managing oral secretions.   Eyes: Conjunctivae and EOM are normal. Pupils are equal, round, and reactive to light. Right eye exhibits no discharge. Left eye exhibits no discharge. No scleral icterus.  Neck: Normal range of motion and phonation normal. Neck supple. No neck rigidity. Normal range of motion present.  Neck ROM intact. No nuchal rigidity.   Cardiovascular: Regular rhythm, normal heart sounds and intact distal pulses.  Tachycardia present.   No murmur heard. Pulmonary/Chest: Effort normal and breath sounds normal. No stridor. No respiratory distress. She has no wheezes. She has no rales.  Symmetric chest expansion. Respirations unlabored. No wheezing or rales. No hypoxia.   Abdominal: Soft. Bowel sounds are normal. She exhibits no distension. There is no tenderness.  Musculoskeletal: Normal range of motion.   Lymphadenopathy:    She has no cervical adenopathy.  Neurological: She is alert. She is not disoriented. Coordination and gait normal. GCS eye subscore is 4. GCS verbal subscore is 5. GCS motor subscore is 6.  Mental Status: Alert, thought content appropriate, able to give a coherent history. Speech fluent without evidence of aphasia.  CN 2-12 grossly intact.  Moves extremities with ease. Sensation grossly equal and intact throughout. Strength 5/5 in all extremities.  Pt ambulatory with steady gait.  Skin: Skin is warm and dry. She is not diaphoretic.  Psychiatric: She has a normal mood and affect. Her behavior is normal.     ED Treatments / Results  Labs (all labs ordered are listed, but only abnormal results are displayed) Labs Reviewed  RAPID STREP SCREEN (NOT AT [facility])  CULTURE, GROUP A STREP ([place])  CBG MONITORING, ED    EKG  EKG Interpretation None       Radiology Dg Chest 2 View  Result Date: 12/24/2015 CLINICAL DATA:  25 year old female with cough congestion and sore throat. EXAM: CHEST  2 VIEW COMPARISON:  Chest radiograph dated  10/18/2015 FINDINGS: There is no focal consolidation, pleural effusion, or pneumothorax. Mild peribronchial thickening may represent bronchitis, asthma or viral infection. Clinical correlation is recommended. The cardiac silhouette is within normal limits. No acute osseous pathology. IMPRESSION: No focal consolidation. Mild peribronchial thickening may represent bronchitis or asthma. Viral pneumonia is not excluded clinical correlation is recommended. Electronically Signed   By: Anner Crete M.D.   On: 12/24/2015 21:57    Procedures Procedures (including critical care time)  Medications Ordered in ED Medications  ondansetron (ZOFRAN-ODT) disintegrating tablet 8 mg (8 mg Oral Refused 12/24/15 2025)  acetaminophen (TYLENOL) tablet 650 mg (650 mg Oral Given 12/24/15 2025)  sodium chloride 0.9 % bolus 1,000 mL (0 mLs Intravenous Stopped  12/24/15 2242)  ibuprofen (ADVIL,MOTRIN) tablet 600 mg (600 mg Oral Given 12/24/15 2159)  lidocaine (XYLOCAINE) 2 % viscous mouth solution 15 mL (15 mLs Mouth/Throat Given 12/25/15 0020)  albuterol (PROVENTIL) (2.5 MG/3ML) 0.083% nebulizer solution 5 mg (5 mg Nebulization Given 12/25/15 0020)     Initial Impression / Assessment and Plan / ED Course  I have reviewed the triage vital signs and the nursing notes.  Pertinent labs & imaging results that were available during my care of the patient were reviewed by me and considered in my medical decision making (see chart for details).  Clinical Course     Patient presents to ED with complaint of feeling feverish, myalgias, sore throat, and cough. Pt temp is 100. Tachycardic. She is non-septic appearing. In NAD. No hypoxia, respirations unlabored. BP nml. No trismus, mild posterior oropharynx erythema. No tonsillar hypertrophy or exudate. No nuchal rigidity. Managing oral secretions. Lungs CTABL. Abdomen soft, non-tender, non-distended. Pain medication given. Viscous lidocaine. IVF initiated. Rapid strep negative, culture sent. Low suspicion at this time for PTA or deep space infection - no trismus, uvula midline and rises symmetrically, no nuchal rigidity. CXR remarkable for mild peribronchial thickening - ?bronchitis vs. ?Viral PNA vs. ?asthma.   Discussed results and plan with patient. Patient endorses improvement. Vital signs improved. Pt no longer tachycardic. Temp 98.6. Respirations remain unlabored. Suspect ?viral URI. Discussed that antibiotics are not indicated for viral infections. Pt will be discharged with symptomatic treatment.  Rx viscous lidocaine and tessalon perles. Rest, fluids, hand hygiene. Follow up with PCP if sxs persist. Strict return precautionsgiven. Verbalizes understanding and is agreeable with plan. Pt is hemodynamically stable & in NAD prior to dc.   Final Clinical Impressions(s) / ED Diagnoses   Final diagnoses:    Upper respiratory tract infection, unspecified type    New Prescriptions Discharge Medication List as of 12/25/2015  1:21 AM    START taking these medications   Details  benzonatate (TESSALON) 100 MG capsule Take 1 capsule (100 mg total) by mouth every 8 (eight) hours., Starting Tue 12/25/2015, Print    lidocaine (XYLOCAINE) 2 % solution Use as directed 15 mLs in the mouth or throat every 4 (four) hours as needed for mouth pain., Starting Tue 12/25/2015, Print         [place], PA-C 12/25/15 [place], MD 12/26/15 [phone]

## 2015-12-25 MED ORDER — LIDOCAINE VISCOUS 2 % MT SOLN: 15.0000 mL | OROMUCOSAL | 0 refills | Status: DC | PRN

## 2015-12-25 MED ORDER — BENZONATATE 100 MG PO CAPS: 100.0000 mg | ORAL_CAPSULE | Freq: Three times a day (TID) | ORAL | 0 refills | Status: DC

## 2015-12-25 NOTE — Discharge Instructions (Signed)
Read the information below.  Your rapid strep was negative. Your x-ray did not show any focal infiltrate, you do have some thickening of your bronchioles which may be bronchitis, asthma, or viral pneumonia.  Continue to take tylenol 650mg  every 6hrs or motrin 400mg  every 6 hrs for pain relief and fever reduction.  I have prescribed viscous lidocaine for relief of sore throat. You can also try honey, warm liquids, and throat lozenges such as cepacol drops. I have also prescribed tessalon perles for cough relief. Use inhaler as needed.  Get plenty of rest, wash hands regularly, stay well hydrated.  Use the prescribed medication as directed.  Please discuss all new medications with your pharmacist.   Follow up with your primary doctor if symptoms persist.  You may return to the Emergency Department at any time for worsening condition or any new symptoms that concern you. Return to ED if your fever persists or you develop difficulty breathing, chest pain, worsening cough, or any other new/concerning symptoms.

## 2015-12-27 LAB — CULTURE, GROUP A STREP (THRC)

## 2016-01-13 ENCOUNTER — Emergency Department (HOSPITAL_COMMUNITY)
Admission: EM | Admit: 2016-01-13 | Discharge: 2016-01-14 | Disposition: A | Discharge status: Home / Self Care | Payer: Medicaid Other | Attending: Emergency Medicine | Admitting: Emergency Medicine

## 2016-01-13 ENCOUNTER — Encounter (HOSPITAL_COMMUNITY): Payer: Self-pay

## 2016-01-13 DIAGNOSIS — J45909 Unspecified asthma, uncomplicated: Secondary | ICD-10-CM | POA: Diagnosis not present

## 2016-01-13 DIAGNOSIS — R197 Diarrhea, unspecified: Secondary | ICD-10-CM

## 2016-01-13 DIAGNOSIS — Z87891 Personal history of nicotine dependence: Secondary | ICD-10-CM | POA: Insufficient documentation

## 2016-01-13 DIAGNOSIS — Z79899 Other long term (current) drug therapy: Secondary | ICD-10-CM | POA: Insufficient documentation

## 2016-01-13 LAB — BASIC METABOLIC PANEL
Anion gap: 10 (ref 5–15)
BUN: 8 mg/dL (ref 6–20)
CALCIUM: 9.2 mg/dL (ref 8.9–10.3)
CO2: 26 mmol/L (ref 22–32)
CREATININE: 0.68 mg/dL (ref 0.44–1.00)
Chloride: 100 mmol/L — ABNORMAL LOW (ref 101–111)
GFR calc non Af Amer: >60 mL/min (ref >60)
Glucose, Bld: 95 mg/dL (ref 65–99)
Potassium: 3.5 mmol/L (ref 3.5–5.1)
SODIUM: 136 mmol/L (ref 135–145)

## 2016-01-13 LAB — CBC
HCT: 34.8 % — ABNORMAL LOW (ref 36.0–46.0)
Hemoglobin: 10.7 g/dL — ABNORMAL LOW (ref 12.0–15.0)
MCH: 23.5 pg — AB (ref 26.0–34.0)
MCHC: 30.7 g/dL (ref 30.0–36.0)
MCV: 76.5 fL — ABNORMAL LOW (ref 78.0–100.0)
Platelets: 341 10*3/uL (ref 150–400)
RBC: 4.55 MIL/uL (ref 3.87–5.11)
RDW: 16.9 % — AB (ref 11.5–15.5)
WBC: 10 10*3/uL (ref 4.0–10.5)

## 2016-01-13 LAB — POC URINE PREG, ED: PREG TEST UR: NEGATIVE

## 2016-01-13 NOTE — ED Provider Notes (Signed)
By signing my name below, I, Jeanell Sparrow, attest that this documentation has been prepared under the direction and in the presence of [place], DO . Electronically Signed: Jeanell Sparrow, Scribe. 01/13/2016. 12:28 AM.  TIME SEEN: 12:28 AM  CHIEF COMPLAINT: Multiple complaints  HPI:  HPI Comments: Kathy Flores is a 26 y.o. female who presents to the Emergency Department complaining of intermittent  diarrhea that started last night. She suspects she has a stomach virus with associated nausea. She reports 7 episodes of nonbloody diarrhea. She had no treatment PTA. No fevers, chills, abdominal pain.  She last complaints of intermittent dizziness as well that she describes as a lightheadedness. She has associated watery eyes and blurry vision (onset yesterday, better today).   She also complaints of a need for tests for pregnancy, HTN, and blood glucose. She denies any other complaints.    ROS: See HPI Constitutional: no fever  Eyes: no drainage  ENT: no runny nose   Cardiovascular:  no chest pain  Resp: no SOB  GI: no vomiting GU: no dysuria Integumentary: no rash  Allergy: no hives  Musculoskeletal: no leg swelling  Neurological: no slurred speech ROS otherwise negative  PAST MEDICAL HISTORY/PAST SURGICAL HISTORY:  Past Medical History:  Diagnosis Date  . Asthma    inhaler used 3x month  . Depression   . Fibroid   . Trichomonas infection   . Vaginal Pap smear, abnormal     MEDICATIONS:  Prior to Admission medications   Medication Sig Start Date End Date Taking? Authorizing Provider  albuterol (PROVENTIL HFA;VENTOLIN HFA) 108 (90 Base) MCG/ACT inhaler Inhale 2 puffs into the lungs every 6 (six) hours as needed for wheezing or shortness of breath. 09/28/15   Costin Karlyne Greenspan, MD  albuterol (PROVENTIL) (2.5 MG/3ML) 0.083% nebulizer solution Take 3 mLs (2.5 mg total) by nebulization every 4 (four) hours as needed for wheezing or shortness of breath. 09/30/15   Margarita Mail, PA-C  benzonatate (TESSALON) 100 MG capsule Take 1 capsule (100 mg total) by mouth every 8 (eight) hours. 12/25/15   Roxanna Mew, PA-C  budesonide (PULMICORT) 180 MCG/ACT inhaler Inhale 1 puff into the lungs 2 (two) times daily. 09/28/15   Costin Karlyne Greenspan, MD  escitalopram (LEXAPRO) 10 MG tablet Take 10 mg by mouth daily.     Historical Provider, MD  lidocaine (XYLOCAINE) 2 % solution Use as directed 15 mLs in the mouth or throat every 4 (four) hours as needed for mouth pain. 12/25/15   Roxanna Mew, PA-C  Norethindrone Acetate-Ethinyl Estradiol (MICROGESTIN) 1.5-30 MG-MCG tablet Take 1 tablet by mouth daily.    Historical Provider, MD    ALLERGIES:  Allergies  Allergen Reactions  . Penicillins Hives and Rash    Has patient had a PCN reaction causing immediate rash, facial/tongue/throat swelling, SOB or lightheadedness with hypotension: Yes Has patient had a PCN reaction causing severe rash involving mucus membranes or skin necrosis: No Has patient had a PCN reaction that required hospitalization No Has patient had a PCN reaction occurring within the last 10 years: Yes If all of the above answers are "NO", then may proceed with Cephalosporin use.     SOCIAL HISTORY:  Social History  Substance Use Topics  . Smoking status: Former Research scientist (life sciences)  . Smokeless tobacco: Never Used  . Alcohol use No    FAMILY HISTORY: Family History  Problem Relation Age of Onset  . Asthma Sister     EXAM: BP 138/76 (BP  Location: Left Arm)   Pulse 86   Temp 98.7 F (37.1 C) (Oral)   Resp 18   LMP 12/07/2015   SpO2 97%  CONSTITUTIONAL: Alert and oriented and responds appropriately to questions. Well-appearing; well-nourished, Afebrile, nontoxic, appears well-hydrated HEAD: Normocephalic EYES: Conjunctivae clear, PERRL, EOMI ENT: normal nose; no rhinorrhea; moist mucous membranes NECK: Supple, no meningismus, no nuchal rigidity, no LAD  CARD: RRR; S1 and S2 appreciated; no  murmurs, no clicks, no rubs, no gallops RESP: Normal chest excursion without splinting or tachypnea; breath sounds clear and equal bilaterally; no wheezes, no rhonchi, no rales, no hypoxia or respiratory distress, speaking full sentences ABD/GI: Normal bowel sounds; non-distended; soft, non-tender, no rebound, no guarding, no peritoneal signs, no hepatosplenomegaly, no tenderness at McBurney's point BACK:  The back appears normal and is non-tender to palpation, there is no CVA tenderness EXT: Normal ROM in all joints; non-tender to palpation; no edema; normal capillary refill; no cyanosis, no calf tenderness or swelling    SKIN: Normal color for age and race; warm; no rash NEURO: Moves all extremities equally, sensation to light touch intact diffusely, cranial nerves II through XII intact, normal speech PSYCH: The patient's mood and manner are appropriate. Grooming and personal hygiene are appropriate.  MEDICAL DECISION MAKING: Patient here with complaints of nausea and diarrhea for the past day with lightheadedness and blurry vision that has resolved. Abdominal exam completely benign. Labs ordered from triage are unremarkable. Hemoglobin 10.7, electrolytes normal, pregnancy test negative. She does not appear dehydrated on exam. Will encourage oral fluids. I do not feel she needs an IV at this time. We'll treat symptomatically with Zofran, Imodium. She is requesting a work note. I feel she is safe for discharge.   At this time, I do not feel there is any life-threatening condition present. I have reviewed and discussed all results (EKG, imaging, lab, urine as appropriate) and exam findings with patient/family. I have reviewed nursing notes and appropriate previous records.  I feel the patient is safe to be discharged home without further emergent workup and can continue workup as an outpatient as needed. Discussed usual and customary return precautions. Patient/family verbalize understanding and are  comfortable with this plan.  Outpatient follow-up has been provided. All questions have been answered.   I personally performed the services described in this documentation, which was scribed in my presence. The recorded information has been reviewed and is accurate.     [place], DO 01/14/16 0040

## 2016-01-13 NOTE — ED Triage Notes (Signed)
Pt states that since last night she has been having nausea and diarrhea, thinks she has the stomach bug. Denies vomiting. Pt is requesting a pregnancy test. Pt states that she has also been having watery eyes, blurred vision, dizziness that has been going on for two months. Pt is also concerned about her her BP and her sugars, pts states she is not diabetic. Pt also states that she called out of work today and needs a doctors note.

## 2016-01-14 MED ORDER — ONDANSETRON 4 MG PO TBDP
4.0000 mg | ORAL_TABLET | Freq: Once | ORAL | Status: AC
Start: 2016-01-14 — End: 2016-01-14
  Administered 2016-01-14: 4 mg via ORAL
  Filled 2016-01-14: qty 1

## 2016-01-14 MED ORDER — LOPERAMIDE HCL 2 MG PO CAPS
4.0000 mg | ORAL_CAPSULE | Freq: Once | ORAL | Status: AC
  Administered 2016-01-14: 4 mg via ORAL
  Filled 2016-01-14: qty 2

## 2016-01-14 MED ORDER — LOPERAMIDE HCL 2 MG PO CAPS: 2.0000 mg | ORAL_CAPSULE | Freq: Four times a day (QID) | ORAL | 0 refills | Status: DC | PRN

## 2016-01-14 MED ORDER — ONDANSETRON 4 MG PO TBDP: 4.0000 mg | ORAL_TABLET | Freq: Three times a day (TID) | ORAL | 0 refills | Status: DC | PRN

## 2016-01-14 NOTE — ED Notes (Signed)
Pt departed in NAD.  

## 2016-01-14 NOTE — Discharge Instructions (Signed)
To find a primary care or specialty doctor please call [phone] or [phone] to access "[facility] Find a Doctor Service." ° °You may also go on the [facility] website at [email or web address] ° °There are also multiple [facility], [facility] and [facility] practices throughout the [place] that are frequently accepting new patients. You may find a clinic that is close to your home and contact them. ° °[facility] -  °[address] °[place] [address] °[phone] ° ° °[facility] -  °[address] °[place] [address] °[phone] ° ° °[facility] - °[address]  °[place] [address] °[phone] ° ° °

## 2016-05-22 ENCOUNTER — Inpatient Hospital Stay (HOSPITAL_COMMUNITY)
Admission: AD | Admit: 2016-05-22 | Discharge: 2016-05-22 | Disposition: A | Discharge status: Home / Self Care | Payer: Medicaid Other | Source: Ambulatory Visit | Attending: Obstetrics & Gynecology | Admitting: Obstetrics & Gynecology

## 2016-05-22 ENCOUNTER — Encounter (HOSPITAL_COMMUNITY): Payer: Self-pay | Admitting: Student

## 2016-05-22 DIAGNOSIS — Z880 Allergy status to penicillin: Secondary | ICD-10-CM | POA: Insufficient documentation

## 2016-05-22 DIAGNOSIS — L02412 Cutaneous abscess of left axilla: Secondary | ICD-10-CM | POA: Diagnosis not present

## 2016-05-22 DIAGNOSIS — R42 Dizziness and giddiness: Secondary | ICD-10-CM | POA: Insufficient documentation

## 2016-05-22 DIAGNOSIS — N939 Abnormal uterine and vaginal bleeding, unspecified: Secondary | ICD-10-CM | POA: Diagnosis present

## 2016-05-22 DIAGNOSIS — Z79899 Other long term (current) drug therapy: Secondary | ICD-10-CM | POA: Diagnosis not present

## 2016-05-22 DIAGNOSIS — J45909 Unspecified asthma, uncomplicated: Secondary | ICD-10-CM | POA: Insufficient documentation

## 2016-05-22 DIAGNOSIS — Z87891 Personal history of nicotine dependence: Secondary | ICD-10-CM | POA: Insufficient documentation

## 2016-05-22 DIAGNOSIS — F329 Major depressive disorder, single episode, unspecified: Secondary | ICD-10-CM | POA: Insufficient documentation

## 2016-05-22 DIAGNOSIS — L02419 Cutaneous abscess of limb, unspecified: Secondary | ICD-10-CM | POA: Diagnosis not present

## 2016-05-22 DIAGNOSIS — T887XXA Unspecified adverse effect of drug or medicament, initial encounter: Secondary | ICD-10-CM

## 2016-05-22 LAB — URINALYSIS, ROUTINE W REFLEX MICROSCOPIC
Bilirubin Urine: NEGATIVE
Glucose, UA: NEGATIVE mg/dL
Ketones, ur: NEGATIVE mg/dL
Nitrite: NEGATIVE
PROTEIN: 30 mg/dL — AB
SPECIFIC GRAVITY, URINE: 1.015 (ref 1.005–1.030)
pH: 8.5 — ABNORMAL HIGH (ref 5.0–8.0)

## 2016-05-22 LAB — CBC
HCT: 38.9 % (ref 36.0–46.0)
HEMOGLOBIN: 12.4 g/dL (ref 12.0–15.0)
MCH: 26 pg (ref 26.0–34.0)
MCHC: 31.9 g/dL (ref 30.0–36.0)
MCV: 81.6 fL (ref 78.0–100.0)
Platelets: 351 10*3/uL (ref 150–400)
RBC: 4.77 MIL/uL (ref 3.87–5.11)
RDW: 15.7 % — AB (ref 11.5–15.5)
WBC: 8.7 10*3/uL (ref 4.0–10.5)

## 2016-05-22 LAB — POCT PREGNANCY, URINE: PREG TEST UR: NEGATIVE

## 2016-05-22 LAB — URINALYSIS, MICROSCOPIC (REFLEX): BACTERIA UA: NONE SEEN

## 2016-05-22 LAB — GLUCOSE, CAPILLARY: GLUCOSE-CAPILLARY: 78 mg/dL (ref 65–99)

## 2016-05-22 MED ORDER — MEDROXYPROGESTERONE ACETATE 10 MG PO TABS: 10.0000 mg | ORAL_TABLET | Freq: Every day | ORAL | 0 refills | Status: DC

## 2016-05-22 MED ORDER — SULFAMETHOXAZOLE-TRIMETHOPRIM 800-160 MG PO TABS: 1.0000 | ORAL_TABLET | Freq: Two times a day (BID) | ORAL | 0 refills | Status: AC

## 2016-05-22 NOTE — Progress Notes (Signed)
Presents to triage for dizziness for past 2 days. Pt on new anti-psychotic med lexapro for anxiety and depression. Pt states started 2 days ago and ever since pt feels dizzy "pass out".

## 2016-05-22 NOTE — Discharge Instructions (Signed)
Skin Abscess A skin abscess is an infected area on or under your skin that contains a collection of pus and other material. An abscess may also be called a furuncle, carbuncle, or boil. An abscess can occur in or on almost any part of your body. Some abscesses break open (rupture) on their own. Most continue to get worse unless they are treated. The infection can spread deeper into the body and eventually into your blood, which can make you feel ill. Treatment usually involves draining the abscess. What are the causes? An abscess occurs when germs, often bacteria, pass through your skin and cause an infection. This may be caused by:  A scrape or cut on your skin.  A puncture wound through your skin, including a needle injection.  Blocked oil or sweat glands.  Blocked and infected hair follicles.  A cyst that forms beneath your skin (sebaceous cyst) and becomes infected. What increases the risk? This condition is more likely to develop in people who:  Have a weak body defense system (immune system).  Have diabetes.  Have dry and irritated skin.  Get frequent injections or use illegal IV drugs.  Have a foreign body in a wound, such as a splinter.  Have problems with their lymph system or veins. What are the signs or symptoms? An abscess may start as a painful, firm bump under the skin. Over time, the abscess may get larger or become softer. Pus may appear at the top of the abscess, causing pressure and pain. It may eventually break through the skin and drain. Other symptoms include:  Redness.  Warmth.  Swelling.  Tenderness.  A sore on the skin. How is this diagnosed? This condition is diagnosed based on your medical history and a physical exam. A sample of pus may be taken from the abscess to find out what is causing the infection and what antibiotics can be used to treat it. You also may have:  Blood tests to look for signs of infection or spread of an infection to your  blood.  Imaging studies such as ultrasound, CT scan, or MRI if the abscess is deep. How is this treated? Small abscesses that drain on their own may not need treatment. Treatment for an abscess that does not rupture on its own may include:  Warm compresses applied to the area several times per day.  Incision and drainage. Your health care provider will make an incision to open the abscess and will remove pus and any foreign body or dead tissue. The incision area may be packed with gauze to keep it open for a few days while it heals.  Antibiotic medicines to treat infection. For a severe abscess, you may first get antibiotics through an IV and then change to oral antibiotics. Follow these instructions at home: Abscess Care   If you have an abscess that has not drained, place a warm, clean, wet washcloth over the abscess several times a day. Do this as told by your health care provider.  Follow instructions from your health care provider about how to take care of your abscess. Make sure you:  Cover the abscess with a bandage (dressing).  Change your dressing or gauze as told by your health care provider.  Wash your hands with soap and water before you change the dressing or gauze. If soap and water are not available, use hand sanitizer.  Check your abscess every day for signs of a worsening infection. Check for:  More redness, swelling, or  pain.  More fluid or blood.  Warmth.  More pus or a bad smell. Medicines   Take over-the-counter and prescription medicines only as told by your health care provider.  If you were prescribed an antibiotic medicine, take it as told by your health care provider. Do not stop taking the antibiotic even if you start to feel better. General instructions   To avoid spreading the infection:  Do not share personal care items, towels, or hot tubs with others.  Avoid making skin contact with other people.  Keep all follow-up visits as told by your  health care provider. This is important. Contact a health care provider if:  You have more redness, swelling, or pain around your abscess.  You have more fluid or blood coming from your abscess.  Your abscess feels warm to the touch.  You have more pus or a bad smell coming from your abscess.  You have a fever.  You have muscle aches.  You have chills or a general ill feeling. Get help right away if:  You have severe pain.  You see red streaks on your skin spreading away from the abscess. This information is not intended to replace advice given to you by your health care provider. Make sure you discuss any questions you have with your health care provider. Document Released: 10/02/2004 Document Revised: 08/19/2015 Document Reviewed: 11/01/2014 Elsevier Interactive Patient Education  2017 [employer]      Abnormal Uterine Bleeding Abnormal uterine bleeding can affect women at various stages in life, including teenagers, women in their reproductive years, pregnant women, and women who have reached menopause. Several kinds of uterine bleeding are considered abnormal, including:  Bleeding or spotting between periods.  Bleeding after sexual intercourse.  Bleeding that is heavier or more than normal.  Periods that last longer than usual.  Bleeding after menopause. Many cases of abnormal uterine bleeding are minor and simple to treat, while others are more serious. Any type of abnormal bleeding should be evaluated by your health care provider. Treatment will depend on the cause of the bleeding. Follow these instructions at home: Monitor your condition for any changes. The following actions may help to alleviate any discomfort you are experiencing:  Avoid the use of tampons and douches as directed by your health care provider.  Change your pads frequently. You should get regular pelvic exams and Pap tests. Keep all follow-up appointments for diagnostic tests as directed by  your health care provider. Contact a health care provider if:  Your bleeding lasts more than 1 week.  You feel dizzy at times. Get help right away if:  You pass out.  You are changing pads every 15 to 30 minutes.  You have abdominal pain.  You have a fever.  You become sweaty or weak.  You are passing large blood clots from the vagina.  You start to feel nauseous and vomit. This information is not intended to replace advice given to you by your health care provider. Make sure you discuss any questions you have with your health care provider. Document Released: 12/23/2004 Document Revised: 06/06/2015 Document Reviewed: 07/22/2012 Elsevier Interactive Patient Education  2017 [employer].

## 2016-05-22 NOTE — MAU Note (Signed)
Pt was taken off her lexapro ,and started on Topamax 3 days ago (by her psychiatrist)  and still on Wellbutrin. Stared feeling dizzy and light headed and when she moves her head suddenly she get dizzy and feels lie she has to pass out. Also c/o pain in her left underarm and feels a large bump .  Reports she has has spotting an d bleeding off and on all month. (not on any birth control.

## 2016-05-22 NOTE — MAU Provider Note (Signed)
History     CSN: [id number]  Arrival date and time: 05/22/16 1115  First Provider Initiated Contact with Patient 05/22/16 1248      Chief Complaint  Patient presents with  . Dizziness  . Arm Pain   HPI Kathy Flores is a 26 y.o. female who presents with dizziness/lightheadedness, axillary pain, & irregular menses. Patient has PCP & psychiatrist in [place] saw PCP in April for AUB & was told she would be referred to gynecology if symptoms continued. Reports irregular menses for the last 5 years. Was given OCPs a few months ago to regulate cycle but quit taking them & reports having daily bleeding for a month after stopping the pills. Currently reports only pink spotting. Denies abdominal pain.  -Saw her psychiatrist 2 days ago & states her medications were changed. They discontinued her lexapro & started her on nightly topamax. Also takes wellbutrin & trazodone. Reports since then has noticed lightheadedness. Symptoms are worse when walking or with position changes. Denies LOC, falls, chest pain, SOB, or palpitations. Denies suicidal or homicidal ideation.  -Noticed a painful lump in her left axilla 2 days ago. States she has had bumps in her axilla before but never this large. Rates pain 10/10. Has not treated. Moving her arm makes pain worse. No drainage. Denies fever/chills.   Past Medical History:  Diagnosis Date  . Asthma    inhaler used 3x month  . Depression   . Fibroid   . Trichomonas infection   . Vaginal Pap smear, abnormal     Past Surgical History:  Procedure Laterality Date  . CESAREAN SECTION N/A 11/23/2013   Procedure: CESAREAN SECTION;  Surgeon: Woodroe Mode, MD;  Location: [facility];  Service: Obstetrics;  Laterality: N/A;    Family History  Problem Relation Age of Onset  . Asthma Sister     Social History  Substance Use Topics  . Smoking status: Former Research scientist (life sciences)  . Smokeless tobacco: Never Used  . Alcohol use No    Allergies:  Allergies   Allergen Reactions  . Penicillins Hives and Rash    Has patient had a PCN reaction causing immediate rash, facial/tongue/throat swelling, SOB or lightheadedness with hypotension: Yes Has patient had a PCN reaction causing severe rash involving mucus membranes or skin necrosis: No Has patient had a PCN reaction that required hospitalization No Has patient had a PCN reaction occurring within the last 10 years: Yes If all of the above answers are "NO", then may proceed with Cephalosporin use.     Prescriptions Prior to Admission  Medication Sig Dispense Refill Last Dose  . albuterol (PROVENTIL HFA;VENTOLIN HFA) 108 (90 Base) MCG/ACT inhaler Inhale 2 puffs into the lungs every 6 (six) hours as needed for wheezing or shortness of breath. 1 Inhaler 3 unk  . albuterol (PROVENTIL) (2.5 MG/3ML) 0.083% nebulizer solution Take 3 mLs (2.5 mg total) by nebulization every 4 (four) hours as needed for wheezing or shortness of breath. 30 vial 1 unk  . benzonatate (TESSALON) 100 MG capsule Take 1 capsule (100 mg total) by mouth every 8 (eight) hours. (Patient taking differently: Take 100 mg by mouth 3 (three) times daily as needed for cough. ) 21 capsule 0 Past Month at Unknown time  . budesonide (PULMICORT) 180 MCG/ACT inhaler Inhale 1 puff into the lungs 2 (two) times daily. (Patient taking differently: Inhale 1 puff into the lungs 2 (two) times daily as needed (shortness of breath). ) 1 Inhaler 2 unk  .  escitalopram (LEXAPRO) 10 MG tablet Take 10 mg by mouth daily.    Past Week at Unknown time  . lidocaine (XYLOCAINE) 2 % solution Use as directed 15 mLs in the mouth or throat every 4 (four) hours as needed for mouth pain. 100 mL 0 unk  . loperamide (IMODIUM) 2 MG capsule Take 1 capsule (2 mg total) by mouth 4 (four) times daily as needed for diarrhea or loose stools. 12 capsule 0   . Norethindrone Acetate-Ethinyl Estradiol (MICROGESTIN) 1.5-30 MG-MCG tablet Take 1 tablet by mouth daily.   01/13/2016 at Unknown  time  . ondansetron (ZOFRAN ODT) 4 MG disintegrating tablet Take 1 tablet (4 mg total) by mouth every 8 (eight) hours as needed for nausea or vomiting. 20 tablet 0     Review of Systems  Constitutional: Negative for chills and fever.  Respiratory: Negative for shortness of breath.   Cardiovascular: Negative for chest pain and palpitations.  Gastrointestinal: Negative.   Genitourinary: Positive for vaginal bleeding. Negative for dysuria and vaginal discharge.  Skin: Positive for wound.  Neurological: Positive for dizziness and light-headedness. Negative for syncope and headaches.  Psychiatric/Behavioral: Negative for suicidal ideas.   Physical Exam   Blood pressure 101/62, pulse 71, temperature 98.3 F (36.8 C), resp. rate 18, height 5\' 3"  (1.6 m), weight 219 lb (99.3 kg), last menstrual period 04/08/2016, unknown if currently breastfeeding.  Physical Exam  Nursing note and vitals reviewed. Constitutional: She is oriented to person, place, and time. She appears well-developed and well-nourished. No distress.  HENT:  Head: Normocephalic and atraumatic.  Eyes: Conjunctivae are normal. Right eye exhibits no discharge. Left eye exhibits no discharge. No scleral icterus.  Neck: Normal range of motion.  Cardiovascular: Normal rate, regular rhythm and normal heart sounds.   No murmur heard. Respiratory: Effort normal and breath sounds normal. No respiratory distress. She has no wheezes.  GI: Soft. Bowel sounds are normal. There is no tenderness.  Lymphadenopathy:       Left: No supraclavicular and no epitrochlear adenopathy present.  Neurological: She is alert and oriented to person, place, and time.  Skin: Skin is warm and dry. Lesion noted. She is not diaphoretic.  Lesion in mid left axilla. ~6x2 cm. Firm to palpate; no fluctuance or drainage. Erythematous & tender. No lymphadenopathy.    Psychiatric: She has a normal mood and affect. Her behavior is normal. Judgment and thought content  normal.    MAU Course  Procedures Results for orders placed or performed during the hospital encounter of 05/22/16 (from the past 48 hour(s))  Urinalysis, Routine w reflex microscopic     Status: Abnormal   Collection Time: 05/22/16 11:45 AM  Result Value Ref Range   Color, Urine AMBER (A) YELLOW    Comment: BIOCHEMICALS MAY BE AFFECTED BY COLOR   APPearance CLOUDY (A) CLEAR   Specific Gravity, Urine 1.015 1.005 - 1.030   pH 8.5 (H) 5.0 - 8.0   Glucose, UA NEGATIVE NEGATIVE mg/dL   Hgb urine dipstick LARGE (A) NEGATIVE   Bilirubin Urine NEGATIVE NEGATIVE   Ketones, ur NEGATIVE NEGATIVE mg/dL   Protein, ur 30 (A) NEGATIVE mg/dL   Nitrite NEGATIVE NEGATIVE   Leukocytes, UA TRACE (A) NEGATIVE  Urinalysis, Microscopic (reflex)     Status: Abnormal   Collection Time: 05/22/16 11:45 AM  Result Value Ref Range   RBC / HPF TOO NUMEROUS TO COUNT 0 - 5 RBC/hpf   WBC, UA 0-5 0 - 5 WBC/hpf   Bacteria, UA NONE  SEEN NONE SEEN   Squamous Epithelial / LPF 6-30 (A) NONE SEEN  Pregnancy, urine POC     Status: None   Collection Time: 05/22/16 12:36 PM  Result Value Ref Range   Preg Test, Ur NEGATIVE NEGATIVE    Comment:        THE SENSITIVITY OF THIS METHODOLOGY IS >24 mIU/mL   CBC     Status: Abnormal   Collection Time: 05/22/16  1:29 PM  Result Value Ref Range   WBC 8.7 4.0 - 10.5 K/uL   RBC 4.77 3.87 - 5.11 MIL/uL   Hemoglobin 12.4 12.0 - 15.0 g/dL   HCT 38.9 36.0 - 46.0 %   MCV 81.6 78.0 - 100.0 fL   MCH 26.0 26.0 - 34.0 pg   MCHC 31.9 30.0 - 36.0 g/dL   RDW 15.7 (H) 11.5 - 15.5 %   Platelets 351 150 - 400 K/uL  Glucose, capillary     Status: None   Collection Time: 05/22/16  1:31 PM  Result Value Ref Range   Glucose-Capillary 78 65 - 99 mg/dL    MDM UPT negative Orthostatic VS -- negative CBC -- hemoglobin stable at 12.4 & no leukocytosis Dr. Rip Harbour assessed axillary abscess. Will d/c home on antibiotics with patient to f/u with her PCP Patient stable. Dizziness likely SE  of abrupt withdrawal of lexapro -- will have pt f/u with psychiatrist.  Assessment and Plan  A: 1. Abscess of axillary region   2. Abnormal uterine bleeding (AUB)   3. Side effect of medication    P; Discharge home Rx bactrim & provera F/u with PCP on Monday for abscess & have PCP refer her to gyn in [place] warm compresses to abscess -- discussed reasons to go to ED F/u with psychiatrist regarding medication changes   Jorje Guild 05/22/2016, 12:48 PM

## 2016-07-30 ENCOUNTER — Encounter (HOSPITAL_COMMUNITY): Payer: Self-pay

## 2016-07-30 ENCOUNTER — Inpatient Hospital Stay (HOSPITAL_COMMUNITY)
Admission: AD | Admit: 2016-07-30 | Discharge: 2016-07-30 | Disposition: A | Discharge status: Home / Self Care | Payer: Medicaid Other | Source: Ambulatory Visit | Attending: Obstetrics & Gynecology | Admitting: Obstetrics & Gynecology

## 2016-07-30 DIAGNOSIS — Z87891 Personal history of nicotine dependence: Secondary | ICD-10-CM | POA: Diagnosis not present

## 2016-07-30 DIAGNOSIS — N939 Abnormal uterine and vaginal bleeding, unspecified: Secondary | ICD-10-CM

## 2016-07-30 DIAGNOSIS — R102 Pelvic and perineal pain unspecified side: Secondary | ICD-10-CM

## 2016-07-30 DIAGNOSIS — Z880 Allergy status to penicillin: Secondary | ICD-10-CM | POA: Insufficient documentation

## 2016-07-30 DIAGNOSIS — Z3202 Encounter for pregnancy test, result negative: Secondary | ICD-10-CM | POA: Insufficient documentation

## 2016-07-30 LAB — URINALYSIS, ROUTINE W REFLEX MICROSCOPIC

## 2016-07-30 LAB — WET PREP, GENITAL
Sperm: NONE SEEN
Trich, Wet Prep: NONE SEEN
Yeast Wet Prep HPF POC: NONE SEEN

## 2016-07-30 LAB — URINALYSIS, MICROSCOPIC (REFLEX)

## 2016-07-30 LAB — CBC
HEMATOCRIT: 36.1 % (ref 36.0–46.0)
Hemoglobin: 11.7 g/dL — ABNORMAL LOW (ref 12.0–15.0)
MCH: 26.8 pg (ref 26.0–34.0)
MCHC: 32.4 g/dL (ref 30.0–36.0)
MCV: 82.8 fL (ref 78.0–100.0)
Platelets: 283 10*3/uL (ref 150–400)
RBC: 4.36 MIL/uL (ref 3.87–5.11)
RDW: 15.3 % (ref 11.5–15.5)
WBC: 9.9 10*3/uL (ref 4.0–10.5)

## 2016-07-30 LAB — POCT PREGNANCY, URINE: PREG TEST UR: NEGATIVE

## 2016-07-30 MED ORDER — KETOROLAC TROMETHAMINE 60 MG/2ML IM SOLN
60.0000 mg | Freq: Once | INTRAMUSCULAR | Status: AC
  Administered 2016-07-30: 60 mg via INTRAMUSCULAR
  Filled 2016-07-30: qty 2

## 2016-07-30 MED ORDER — DICLOFENAC SODIUM 75 MG PO TBEC: 75.0000 mg | DELAYED_RELEASE_TABLET | Freq: Two times a day (BID) | ORAL | 1 refills | Status: DC

## 2016-07-30 NOTE — Progress Notes (Addendum)
G3P3 nonpregnant. UPT negative. Presents to triage for severe pelvic pain since last week. Denies discharge or bleeding.   Last intercourse was on July 21.   2035: lab at bs  2042: toradol given left buttocks  2044: blind sweep of GC and wet prep done.   2055: Provider at bs assessing pt. GC, wet prep and pelvic exam done.

## 2016-07-30 NOTE — Discharge Instructions (Signed)

## 2016-07-30 NOTE — MAU Provider Note (Signed)
History     CSN: [id number]  Arrival date and time: 07/30/16 1702   First Provider Initiated Contact with Patient 07/30/16 2020     Chief Complaint  Patient presents with  . Pelvic Pain   HPI Kathy Flores is a 26 y.o. [phone] non pregnant female who presents with lower abdominal pain and vaginal bleeding. She states she has had the pain for a week but it got worse tonight. She rates the pain a 9/10 and has not tried anything for the pain. She states she started having heavy vaginal bleeding today and has gone through 8 pads in the last 24 hours. She reports only having a period every other month but this period is heavier than normal. She was seen by her gyn on 7/2 for irregular periods, prescriped provera but never filled the prescription. She also has a follow up on 8/15 with the same gyn doctor. LMP 07/30/16  OB History    Gravida Para Term Preterm AB Living   3 3 3     3    SAB TAB Ectopic Multiple Live Births         0 3      Past Medical History:  Diagnosis Date  . Asthma    inhaler used 3x month  . Depression   . Fibroid   . Trichomonas infection   . Vaginal Pap smear, abnormal     Past Surgical History:  Procedure Laterality Date  . CESAREAN SECTION N/A 11/23/2013   Procedure: CESAREAN SECTION;  Surgeon: Woodroe Mode, MD;  Location: [facility];  Service: Obstetrics;  Laterality: N/A;    Family History  Problem Relation Age of Onset  . Asthma Sister     Social History  Substance Use Topics  . Smoking status: Former Research scientist (life sciences)  . Smokeless tobacco: Never Used  . Alcohol use No    Allergies:  Allergies  Allergen Reactions  . Penicillins Hives and Rash    Has patient had a PCN reaction causing immediate rash, facial/tongue/throat swelling, SOB or lightheadedness with hypotension: Yes Has patient had a PCN reaction causing severe rash involving mucus membranes or skin necrosis: No Has patient had a PCN reaction that required hospitalization No Has patient  had a PCN reaction occurring within the last 10 years: No If all of the above answers are "NO", then may proceed with Cephalosporin use.     Prescriptions Prior to Admission  Medication Sig Dispense Refill Last Dose  . albuterol (PROVENTIL HFA;VENTOLIN HFA) 108 (90 Base) MCG/ACT inhaler Inhale 2 puffs into the lungs every 6 (six) hours as needed for wheezing or shortness of breath.   05/21/2016 at Unknown time  . albuterol (PROVENTIL) (2.5 MG/3ML) 0.083% nebulizer solution Take 2.5 mg by nebulization every 6 (six) hours as needed for wheezing or shortness of breath.   Past Week at Unknown time  . buPROPion (WELLBUTRIN XL) 150 MG 24 hr tablet Take 150 mg by mouth daily.    05/21/2016 at Unknown time  . medroxyPROGESTERone (PROVERA) 10 MG tablet Take 1 tablet (10 mg total) by mouth daily. 10 tablet 0   . topiramate (TOPAMAX) 50 MG tablet One tab at bed time drink plenty of water   05/21/2016 at Unknown time  . traZODone (DESYREL) 50 MG tablet Take 50 mg by mouth at bedtime as needed for sleep.    05/21/2016 at Unknown time    Review of Systems  Constitutional: Negative.  Negative for chills and fever.  HENT: Negative.  Respiratory: Negative.  Negative for shortness of breath.   Cardiovascular: Negative.  Negative for chest pain.  Gastrointestinal: Positive for abdominal pain. Negative for constipation, diarrhea, nausea and vomiting.  Genitourinary: Positive for pelvic pain and vaginal bleeding. Negative for dysuria, frequency, urgency and vaginal discharge.  Neurological: Negative.  Negative for dizziness and headaches.  Psychiatric/Behavioral: Negative.    Physical Exam   Blood pressure 132/75, pulse 82, temperature 98.6 F (37 C), resp. rate 16, weight 210 lb (95.3 kg), last menstrual period 05/29/2016, unknown if currently breastfeeding.  Physical Exam  Nursing note and vitals reviewed. Constitutional: She is oriented to person, place, and time. She appears well-developed and  well-nourished.  HENT:  Head: Normocephalic and atraumatic.  Eyes: Conjunctivae are normal. No scleral icterus.  Cardiovascular: Normal rate, regular rhythm and normal heart sounds.   Respiratory: Effort normal and breath sounds normal. No respiratory distress.  GI: Soft. She exhibits no distension. There is no tenderness. There is no guarding.  Genitourinary: Cervix exhibits no motion tenderness. There is bleeding in the vagina.  Neurological: She is alert and oriented to person, place, and time.  Skin: Skin is warm and dry.  Psychiatric: She has a normal mood and affect. Her behavior is normal. Judgment and thought content normal.   Pelvic exam: Cervix pink, visually closed, without lesion, moderate amount of dark red blood in vault and oozing from cervix, vaginal walls and external genitalia normal Bimanual exam: Cervix 0/long/high, firm, anterior, neg CMT, uterus nontender, nonenlarged, adnexa without tenderness, enlargement, or mass  MAU Course  Procedures Results for orders placed or performed during the hospital encounter of 07/30/16 (from the past 24 hour(s))  Urinalysis, Routine w reflex microscopic     Status: Abnormal   Collection Time: 07/30/16  5:27 PM  Result Value Ref Range   Color, Urine RED (A) YELLOW   APPearance CLOUDY (A) CLEAR   Specific Gravity, Urine  1.005 - 1.030    TEST NOT REPORTED DUE TO COLOR INTERFERENCE OF URINE PIGMENT   pH  5.0 - 8.0    TEST NOT REPORTED DUE TO COLOR INTERFERENCE OF URINE PIGMENT   Glucose, UA (A) NEGATIVE mg/dL    TEST NOT REPORTED DUE TO COLOR INTERFERENCE OF URINE PIGMENT   Hgb urine dipstick (A) NEGATIVE    TEST NOT REPORTED DUE TO COLOR INTERFERENCE OF URINE PIGMENT   Bilirubin Urine (A) NEGATIVE    TEST NOT REPORTED DUE TO COLOR INTERFERENCE OF URINE PIGMENT   Ketones, ur (A) NEGATIVE mg/dL    TEST NOT REPORTED DUE TO COLOR INTERFERENCE OF URINE PIGMENT   Protein, ur (A) NEGATIVE mg/dL    TEST NOT REPORTED DUE TO COLOR  INTERFERENCE OF URINE PIGMENT   Nitrite (A) NEGATIVE    TEST NOT REPORTED DUE TO COLOR INTERFERENCE OF URINE PIGMENT   Leukocytes, UA (A) NEGATIVE    TEST NOT REPORTED DUE TO COLOR INTERFERENCE OF URINE PIGMENT  Urinalysis, Microscopic (reflex)     Status: Abnormal   Collection Time: 07/30/16  5:27 PM  Result Value Ref Range   RBC / HPF TOO NUMEROUS TO COUNT 0 - 5 RBC/hpf   WBC, UA 6-30 0 - 5 WBC/hpf   Bacteria, UA FEW (A) NONE SEEN   Squamous Epithelial / LPF 0-5 (A) NONE SEEN   Mucous PRESENT   Pregnancy, urine POC     Status: None   Collection Time: 07/30/16  5:42 PM  Result Value Ref Range   Preg Test, Ur NEGATIVE NEGATIVE  CBC     Status: Abnormal   Collection Time: 07/30/16  8:34 PM  Result Value Ref Range   WBC 9.9 4.0 - 10.5 K/uL   RBC 4.36 3.87 - 5.11 MIL/uL   Hemoglobin 11.7 (L) 12.0 - 15.0 g/dL   HCT 36.1 36.0 - 46.0 %   MCV 82.8 78.0 - 100.0 fL   MCH 26.8 26.0 - 34.0 pg   MCHC 32.4 30.0 - 36.0 g/dL   RDW 15.3 11.5 - 15.5 %   Platelets 283 150 - 400 K/uL  Wet prep, genital     Status: Abnormal   Collection Time: 07/30/16  8:57 PM  Result Value Ref Range   Yeast Wet Prep HPF POC NONE SEEN NONE SEEN   Trich, Wet Prep NONE SEEN NONE SEEN   Clue Cells Wet Prep HPF POC PRESENT (A) NONE SEEN   WBC, Wet Prep HPF POC FEW (A) NONE SEEN   Sperm NONE SEEN    MDM UPT, UA- test not reportable due to color, patient denies taking any medication to alter urine CBC Toradol 60mg  IM Wet prep and gc/chlamydia Patient had normal u/s on 7/12, pain resolved with toradol. Has appointment on 8/15 and will follow up there.  Assessment and Plan   1. Abnormal uterine bleeding (AUB)   2. Pelvic pain    -Discharge patient home in stable condition -Prescription for Diclofenac 75mg  sent to patient's pharmacy -Follow up with Gyn on 8/15 as scheduled and pick up provera as prescribed on previous visit -Encouraged to return here or to other Urgent Care/ED if she develops worsening of  symptoms, increase in pain, fever, or other concerning symptoms.   Len Blalock SNM 07/30/2016, 9:31 PM   I confirm that I have verified the information documented in the nurse midwife student's note and that I have also personally reperformed the physical exam and all medical decision making activities.   Marcille Buffy 9:49 PM 07/30/16

## 2016-07-30 NOTE — MAU Note (Signed)
Pt presents to MAU with complaints of pelvic pain that started last week. Denies any vaginal bleeding or abnormal discharge

## 2016-08-01 LAB — GC/CHLAMYDIA PROBE AMP (~~LOC~~) NOT AT ARMC
CHLAMYDIA, DNA PROBE: NEGATIVE
NEISSERIA GONORRHEA: NEGATIVE

## 2016-08-20 DIAGNOSIS — R87810 Cervical high risk human papillomavirus (HPV) DNA test positive: Secondary | ICD-10-CM | POA: Diagnosis not present

## 2016-08-20 DIAGNOSIS — N870 Mild cervical dysplasia: Secondary | ICD-10-CM | POA: Diagnosis not present

## 2016-09-02 DIAGNOSIS — F3162 Bipolar disorder, current episode mixed, moderate: Secondary | ICD-10-CM | POA: Diagnosis not present

## 2016-09-02 DIAGNOSIS — J45909 Unspecified asthma, uncomplicated: Secondary | ICD-10-CM | POA: Diagnosis not present

## 2016-09-02 DIAGNOSIS — E669 Obesity, unspecified: Secondary | ICD-10-CM | POA: Diagnosis not present

## 2016-09-02 DIAGNOSIS — F17211 Nicotine dependence, cigarettes, in remission: Secondary | ICD-10-CM | POA: Diagnosis not present

## 2016-09-22 ENCOUNTER — Encounter (HOSPITAL_COMMUNITY): Payer: Self-pay | Admitting: *Deleted

## 2016-09-22 ENCOUNTER — Emergency Department (HOSPITAL_COMMUNITY)
Admission: EM | Admit: 2016-09-22 | Discharge: 2016-09-22 | Disposition: A | Discharge status: Home / Self Care | Payer: Medicaid Other | Attending: Emergency Medicine | Admitting: Emergency Medicine

## 2016-09-22 DIAGNOSIS — Z5321 Procedure and treatment not carried out due to patient leaving prior to being seen by health care provider: Secondary | ICD-10-CM | POA: Insufficient documentation

## 2016-09-22 DIAGNOSIS — R4589 Other symptoms and signs involving emotional state: Secondary | ICD-10-CM | POA: Diagnosis not present

## 2016-09-22 LAB — POC URINE PREG, ED: Preg Test, Ur: NEGATIVE

## 2016-09-22 NOTE — ED Triage Notes (Signed)
Pt states that he feels "weird" and that she wanted her BP evaluated as she feels that her BP is up as it was up when she checked it at school.  She states that she feels anxious due to many things going on in her life including [employer]. No pain, no fever, no illness.

## 2016-09-22 NOTE — ED Notes (Signed)
Pt called twice. No answer. Will try again.

## 2016-09-22 NOTE — ED Notes (Signed)
Called by Wannetta Sender x's 6 to be moved to RM. No Answer

## 2016-09-23 ENCOUNTER — Encounter (HOSPITAL_COMMUNITY): Payer: Self-pay | Admitting: *Deleted

## 2016-09-23 ENCOUNTER — Ambulatory Visit (HOSPITAL_COMMUNITY)
Admission: EM | Admit: 2016-09-23 | Discharge: 2016-09-23 | Disposition: A | Discharge status: Home / Self Care | Payer: Medicaid Other | Attending: Family Medicine | Admitting: Family Medicine

## 2016-09-23 DIAGNOSIS — R002 Palpitations: Secondary | ICD-10-CM

## 2016-09-23 LAB — POCT PREGNANCY, URINE: Preg Test, Ur: NEGATIVE

## 2016-09-23 NOTE — Discharge Instructions (Signed)
No alarming signs on exam. Urine pregnancy negative. Follow diet for to help with blood pressure. Keep hydrated, you urine should be clear to pale yellow in color. Follow up with PCP for further evaluation and treatment needed. If experiencing chest pain, shortness of breath, palpitations, weakness, dizziness, suicidal ideation, homicidal ideation, go to the emergency department for further evaluation.

## 2016-09-23 NOTE — ED Triage Notes (Signed)
Patient states that she doesn't feel right, states she feels very weird and something does not feel right. Took BP while at school yesterday and was elevated.   Patient checked in to ED last night but was LWBS.

## 2016-09-23 NOTE — ED Provider Notes (Signed)
[facility]    CSN: [id number] Arrival date & time: 09/23/16  1756     History   Chief Complaint Chief Complaint  Patient presents with  . Hypertension    HPI CALYN SIVILS is a 26 y.o. female.   26 year old female with history depression, asthma comes in for 2 day history of palpitations/anxiety/high blood pressure. States she had some high blood pressure of 149/100. States she just feels "weird", stating it is feels like an anticipation of something about to happen. Denies changes in life recently. Denies increased stress. Had sensation of shortness of breath, palpitations and that is why she had her blood pressure checked. Deneis weakness, dizziness. Feels that she has heavy breathing right now, denies chest tightness, current palpitations. Denies fever, chills, night sweats. Denies URI symptoms such as cough, congestion, rhinorrhea. Denies sick contact. Denies history of anxiety. Denies urinary symptoms such as frequency, dysuria, hematuria. Denies vaginal discharge, itchiness/pain, spotting. Sexually active with one partner, no condom use, no other forms of birth control use.      Past Medical History:  Diagnosis Date  . Asthma    inhaler used 3x month  . Depression   . Fibroid   . Trichomonas infection   . Vaginal Pap smear, abnormal     Patient Active Problem List   Diagnosis Date Noted  . Asthma exacerbation   . Metabolic acidosis   . Acute respiratory failure with hypoxemia ([place]) 09/23/2015  . SIRS (systemic inflammatory response syndrome) ([place]) 09/23/2015  . Normochromic normocytic anemia 09/23/2015  . Lactic acidosis   . Encounter for observation of infant for suspected infection   . Post term pregnancy, 41 weeks 11/22/2013  . Post-term pregnancy, 40-42 weeks of gestation   . [redacted] weeks gestation of pregnancy     Past Surgical History:  Procedure Laterality Date  . CESAREAN SECTION N/A 11/23/2013   Procedure: CESAREAN SECTION;  Surgeon:  Woodroe Mode, MD;  Location: [facility];  Service: Obstetrics;  Laterality: N/A;    OB History    Gravida Para Term Preterm AB Living   3 3 3     3    SAB TAB Ectopic Multiple Live Births         0 3       Home Medications    Prior to Admission medications   Medication Sig Start Date End Date Taking? Authorizing Provider  albuterol (PROVENTIL HFA;VENTOLIN HFA) 108 (90 Base) MCG/ACT inhaler Inhale 2 puffs into the lungs every 6 (six) hours as needed for wheezing or shortness of breath.   Yes [provider]  albuterol (PROVENTIL) (2.5 MG/3ML) 0.083% nebulizer solution Take 2.5 mg by nebulization every 6 (six) hours as needed for wheezing or shortness of breath.   Yes [provider]  buPROPion (WELLBUTRIN XL) 150 MG 24 hr tablet Take 150 mg by mouth daily.  04/28/16 04/28/17 Yes [provider]  busPIRone (BUSPAR) 5 MG tablet Take 5 mg by mouth 3 (three) times daily.   Yes [provider]  topiramate (TOPAMAX) 50 MG tablet One tab at bed time drink plenty of water 05/19/16 10/19/16 Yes [provider]  traZODone (DESYREL) 50 MG tablet Take 50 mg by mouth at bedtime as needed for sleep.    Yes [provider]    Family History Family History  Problem Relation Age of Onset  . Asthma Sister     Social History Social History  Substance Use Topics  . Smoking status:  Former Smoker  . Smokeless tobacco: Never Used  . Alcohol use No     Allergies   Penicillins   Review of Systems Review of Systems  Reason unable to perform ROS: See HPI as above.     Physical Exam Triage Vital Signs ED Triage Vitals [09/23/16 1829]  Enc Vitals Group     BP 135/77     Pulse Rate 72     Resp 18     Temp 98.6 F (37 C)     Temp Source Oral     SpO2 100 %     Weight      Height      Head Circumference      Peak Flow      Pain Score      Pain Loc      Pain Edu?      Excl. in [place]?    No data found.   Updated Vital Signs BP 135/77  (BP Location: Left Arm)   Pulse 72   Temp 98.6 F (37 C) (Oral)   Resp 18   SpO2 100%   Physical Exam  Constitutional: She is oriented to person, place, and time. She appears well-developed and well-nourished. No distress.  HENT:  Head: Normocephalic and atraumatic.  Right Ear: Tympanic membrane, external ear and ear canal normal. Tympanic membrane is not erythematous and not bulging.  Left Ear: Tympanic membrane, external ear and ear canal normal. Tympanic membrane is not erythematous and not bulging.  Nose: Nose normal. Right sinus exhibits no maxillary sinus tenderness and no frontal sinus tenderness. Left sinus exhibits no maxillary sinus tenderness and no frontal sinus tenderness.  Mouth/Throat: Uvula is midline, oropharynx is clear and moist and mucous membranes are normal.  Eyes: Pupils are equal, round, and reactive to light. Conjunctivae are normal.  Neck: Normal range of motion. Neck supple.  Cardiovascular: Normal rate, regular rhythm and normal heart sounds.  Exam reveals no gallop and no friction rub.   No murmur heard. Pulmonary/Chest: Effort normal and breath sounds normal. She has no decreased breath sounds. She has no wheezes. She has no rhonchi. She has no rales.  Lymphadenopathy:    She has no cervical adenopathy.  Neurological: She is alert and oriented to person, place, and time. She has normal strength. She is not disoriented. No cranial nerve deficit or sensory deficit. GCS eye subscore is 4. GCS verbal subscore is 5. GCS motor subscore is 6.  Skin: Skin is warm and dry.  Psychiatric: She has a normal mood and affect. Her behavior is normal. Judgment normal.     UC Treatments / Results  Labs (all labs ordered are listed, but only abnormal results are displayed) Labs Reviewed  POCT PREGNANCY, URINE    EKG  EKG Interpretation None       Radiology No results found.  Procedures Procedures (including critical care time)  Medications Ordered in  UC Medications - No data to display   Initial Impression / Assessment and Plan / UC Course  I have reviewed the triage vital signs and the nursing notes.  Pertinent labs & imaging results that were available during my care of the patient were reviewed by me and considered in my medical decision making (see chart for details).    Urine negative for pregnancy. Patient without palpitations and tachycardia right now. Given benign exam, will have patient follow up with PCP for further evaluation and treatment needed. DASH diet information given to patient for blood pressure  reduction. Return precautions given. Patient expresses understanding and agrees to plan.   Final Clinical Impressions(s) / UC Diagnoses   Final diagnoses:  Palpitations    New Prescriptions Discharge Medication List as of 09/23/2016  7:26 PM        Ok Edwards, PA-C 09/23/16 1933

## 2016-09-25 DIAGNOSIS — J45909 Unspecified asthma, uncomplicated: Secondary | ICD-10-CM | POA: Diagnosis not present

## 2016-09-25 DIAGNOSIS — E669 Obesity, unspecified: Secondary | ICD-10-CM | POA: Diagnosis not present

## 2016-09-25 DIAGNOSIS — F17211 Nicotine dependence, cigarettes, in remission: Secondary | ICD-10-CM | POA: Diagnosis not present

## 2016-09-25 DIAGNOSIS — F3162 Bipolar disorder, current episode mixed, moderate: Secondary | ICD-10-CM | POA: Diagnosis not present

## 2017-01-15 DIAGNOSIS — J45909 Unspecified asthma, uncomplicated: Secondary | ICD-10-CM | POA: Diagnosis not present

## 2017-01-15 DIAGNOSIS — E669 Obesity, unspecified: Secondary | ICD-10-CM | POA: Diagnosis not present

## 2017-01-15 DIAGNOSIS — F3162 Bipolar disorder, current episode mixed, moderate: Secondary | ICD-10-CM | POA: Diagnosis not present

## 2017-01-15 DIAGNOSIS — F17211 Nicotine dependence, cigarettes, in remission: Secondary | ICD-10-CM | POA: Diagnosis not present

## 2017-03-01 ENCOUNTER — Emergency Department (HOSPITAL_COMMUNITY): Payer: Self-pay

## 2017-03-01 ENCOUNTER — Emergency Department (HOSPITAL_COMMUNITY)
Admission: EM | Admit: 2017-03-01 | Discharge: 2017-03-01 | Disposition: A | Discharge status: Home / Self Care | Payer: Self-pay | Attending: Emergency Medicine | Admitting: Emergency Medicine

## 2017-03-01 ENCOUNTER — Encounter (HOSPITAL_COMMUNITY): Payer: Self-pay

## 2017-03-01 DIAGNOSIS — J069 Acute upper respiratory infection, unspecified: Secondary | ICD-10-CM | POA: Insufficient documentation

## 2017-03-01 DIAGNOSIS — J45909 Unspecified asthma, uncomplicated: Secondary | ICD-10-CM | POA: Insufficient documentation

## 2017-03-01 DIAGNOSIS — R51 Headache: Secondary | ICD-10-CM

## 2017-03-01 DIAGNOSIS — Z87891 Personal history of nicotine dependence: Secondary | ICD-10-CM | POA: Insufficient documentation

## 2017-03-01 DIAGNOSIS — Z79899 Other long term (current) drug therapy: Secondary | ICD-10-CM | POA: Insufficient documentation

## 2017-03-01 DIAGNOSIS — R519 Headache, unspecified: Secondary | ICD-10-CM

## 2017-03-01 LAB — URINALYSIS, ROUTINE W REFLEX MICROSCOPIC
Bilirubin Urine: NEGATIVE
GLUCOSE, UA: NEGATIVE mg/dL
HGB URINE DIPSTICK: NEGATIVE
KETONES UR: NEGATIVE mg/dL
LEUKOCYTES UA: NEGATIVE
Nitrite: NEGATIVE
PROTEIN: NEGATIVE mg/dL
Specific Gravity, Urine: 1.014 (ref 1.005–1.030)
pH: 6 (ref 5.0–8.0)

## 2017-03-01 LAB — POC URINE PREG, ED: PREG TEST UR: NEGATIVE

## 2017-03-01 MED ORDER — DEXAMETHASONE SODIUM PHOSPHATE 10 MG/ML IJ SOLN
10.0000 mg | Freq: Once | INTRAMUSCULAR | Status: AC
  Administered 2017-03-01: 10 mg via INTRAVENOUS
  Filled 2017-03-01: qty 1

## 2017-03-01 MED ORDER — ALBUTEROL SULFATE (2.5 MG/3ML) 0.083% IN NEBU
INHALATION_SOLUTION | RESPIRATORY_TRACT | Status: AC
  Administered 2017-03-01: 5 mg via RESPIRATORY_TRACT
  Filled 2017-03-01: qty 6

## 2017-03-01 MED ORDER — DIPHENHYDRAMINE HCL 50 MG/ML IJ SOLN
25.0000 mg | Freq: Once | INTRAMUSCULAR | Status: AC
  Administered 2017-03-01: 25 mg via INTRAVENOUS
  Filled 2017-03-01: qty 1

## 2017-03-01 MED ORDER — BENZONATATE 200 MG PO CAPS: 200.0000 mg | ORAL_CAPSULE | Freq: Three times a day (TID) | ORAL | 0 refills | Status: DC | PRN

## 2017-03-01 MED ORDER — METOCLOPRAMIDE HCL 5 MG/ML IJ SOLN
10.0000 mg | Freq: Once | INTRAMUSCULAR | Status: AC
  Administered 2017-03-01: 10 mg via INTRAVENOUS
  Filled 2017-03-01: qty 2

## 2017-03-01 MED ORDER — ACETAMINOPHEN 325 MG PO TABS
650.0000 mg | ORAL_TABLET | Freq: Once | ORAL | Status: AC | PRN
  Administered 2017-03-01: 650 mg via ORAL
  Filled 2017-03-01: qty 2

## 2017-03-01 MED ORDER — PREDNISONE 10 MG PO TABS: 20.0000 mg | ORAL_TABLET | Freq: Two times a day (BID) | ORAL | 0 refills | Status: DC

## 2017-03-01 MED ORDER — ALBUTEROL SULFATE (2.5 MG/3ML) 0.083% IN NEBU
5.0000 mg | INHALATION_SOLUTION | Freq: Once | RESPIRATORY_TRACT | Status: AC
  Administered 2017-03-01: 5 mg via RESPIRATORY_TRACT

## 2017-03-01 MED ORDER — SODIUM CHLORIDE 0.9 % IV SOLN
Freq: Once | INTRAVENOUS | Status: AC
  Administered 2017-03-01: 15:00:00 via INTRAVENOUS

## 2017-03-01 MED ORDER — GUAIFENESIN-CODEINE 100-10 MG/5ML PO SOLN: 5.0000 mL | Freq: Every evening | ORAL | 0 refills | Status: DC | PRN

## 2017-03-01 NOTE — ED Notes (Signed)
Pt advised she is unable to urinate at this time. Advised her as soon as she could give sample let us know.

## 2017-03-01 NOTE — ED Provider Notes (Signed)
[facility] EMERGENCY DEPARTMENT Provider Note   CSN: [id number] Arrival date & time: 03/01/17  1228     History   Chief Complaint Chief Complaint  Patient presents with  . Fever  . Headache    HPI Kathy Flores is a 27 y.o. female who presents to the ED with headache, congestion, fever, chills and cough. Symptoms started 2 days ago and have gotten worse. Patient reports hx of migraines.   The history is provided by the patient. No language interpreter was used.  Fever   This is a new problem. The current episode started 2 days ago. Associated symptoms include congestion, headaches and cough. Pertinent negatives include no diarrhea, no vomiting and no sore throat. She has tried nothing for the symptoms.  Headache   This is a new problem. The current episode started yesterday. The problem occurs constantly. The problem has been gradually worsening. The pain is located in the frontal region. The quality of the pain is described as sharp. The pain is at a severity of 8/10. The pain does not radiate. Associated symptoms include a fever. Pertinent negatives include no syncope, no nausea and no vomiting. She has tried nothing for the symptoms.  URI   This is a new problem. The current episode started 2 days ago. The problem has been gradually worsening. The maximum temperature recorded prior to her arrival was 102 to 102.9 F. Associated symptoms include congestion, headaches, rhinorrhea and cough. Pertinent negatives include no abdominal pain, no diarrhea, no nausea, no vomiting, no dysuria, no ear pain, no sore throat, no rash and no wheezing. She has tried nothing for the symptoms.    Past Medical History:  Diagnosis Date  . Asthma    inhaler used 3x month  . Depression   . Fibroid   . Trichomonas infection   . Vaginal Pap smear, abnormal     Patient Active Problem List   Diagnosis Date Noted  . Asthma exacerbation   . Metabolic acidosis   . Acute  respiratory failure with hypoxemia ([place]) 09/23/2015  . SIRS (systemic inflammatory response syndrome) ([place]) 09/23/2015  . Normochromic normocytic anemia 09/23/2015  . Lactic acidosis   . Encounter for observation of infant for suspected infection   . Post term pregnancy, 41 weeks 11/22/2013  . Post-term pregnancy, 40-42 weeks of gestation   . [redacted] weeks gestation of pregnancy     Past Surgical History:  Procedure Laterality Date  . CESAREAN SECTION N/A 11/23/2013   Procedure: CESAREAN SECTION;  Surgeon: Woodroe Mode, MD;  Location: [facility];  Service: Obstetrics;  Laterality: N/A;    OB History    Gravida Para Term Preterm AB Living   3 3 3     3    SAB TAB Ectopic Multiple Live Births         0 3       Home Medications    Prior to Admission medications   Medication Sig Start Date End Date Taking? Authorizing Provider  albuterol (PROVENTIL HFA;VENTOLIN HFA) 108 (90 Base) MCG/ACT inhaler Inhale 2 puffs into the lungs every 6 (six) hours as needed for wheezing or shortness of breath.    [provider]  albuterol (PROVENTIL) (2.5 MG/3ML) 0.083% nebulizer solution Take 2.5 mg by nebulization every 6 (six) hours as needed for wheezing or shortness of breath.    [provider]  benzonatate (TESSALON) 200 MG capsule Take 1 capsule (200 mg total) by mouth 3 (three) times daily as  needed for cough. 03/01/17   Ashley Murrain, NP  buPROPion (WELLBUTRIN XL) 150 MG 24 hr tablet Take 150 mg by mouth daily.  04/28/16 04/28/17  [provider]  busPIRone (BUSPAR) 5 MG tablet Take 5 mg by mouth 3 (three) times daily.    [provider]  topiramate (TOPAMAX) 50 MG tablet One tab at bed time drink plenty of water 05/19/16 10/19/16  [provider]  traZODone (DESYREL) 50 MG tablet Take 50 mg by mouth at bedtime as needed for sleep.     [provider]    Family History Family History  Problem Relation Age of Onset  . Asthma Sister     Social  History Social History   Tobacco Use  . Smoking status: Former Research scientist (life sciences)  . Smokeless tobacco: Never Used  Substance Use Topics  . Alcohol use: No  . Drug use: No     Allergies   Penicillins   Review of Systems Review of Systems  Constitutional: Positive for chills and fever.  HENT: Positive for congestion, postnasal drip, rhinorrhea and sinus pressure. Negative for ear pain and sore throat.   Respiratory: Positive for cough. Negative for wheezing.   Cardiovascular: Negative for syncope.  Gastrointestinal: Negative for abdominal pain, diarrhea, nausea and vomiting.  Genitourinary: Negative for dysuria, frequency and urgency.  Musculoskeletal: Positive for myalgias.  Skin: Negative for rash.  Neurological: Positive for light-headedness and headaches. Negative for syncope.  Psychiatric/Behavioral: Negative for confusion.     Physical Exam Updated Vital Signs BP 125/72   Pulse 84   Temp 99.9 F (37.7 C) (Oral)   Resp 16   SpO2 100%   Physical Exam  Constitutional: She is oriented to person, place, and time. She appears well-developed and well-nourished. No distress.  HENT:  Head: Normocephalic and atraumatic.  Right Ear: Tympanic membrane normal.  Left Ear: Tympanic membrane normal.  Nose: Mucosal edema and rhinorrhea present.  Mouth/Throat: Uvula is midline and mucous membranes are normal. Posterior oropharyngeal erythema present. No oropharyngeal exudate or posterior oropharyngeal edema.  Eyes: Conjunctivae and EOM are normal. Pupils are equal, round, and reactive to light.  Neck: Normal range of motion. Neck supple.  No meningeal signs.  Cardiovascular: Normal rate, regular rhythm, normal heart sounds and intact distal pulses.  Pulmonary/Chest: Effort normal. No respiratory distress. Wheezes: occasional. She has no rales. She exhibits no tenderness.  Abdominal: Soft. There is no tenderness.  Musculoskeletal: Normal range of motion.  Neurological: She is alert and  oriented to person, place, and time. She has normal strength. No cranial nerve deficit or sensory deficit. She displays a negative Romberg sign. Gait normal.  Reflex Scores:      Bicep reflexes are 2+ on the right side and 2+ on the left side.      Brachioradialis reflexes are 2+ on the right side and 2+ on the left side.      Patellar reflexes are 2+ on the right side and 2+ on the left side. Rapid alternating movement without difficulty.  Skin: Skin is warm and dry.  Psychiatric: She has a normal mood and affect.  Nursing note and vitals reviewed.    ED Treatments / Results  Labs (all labs ordered are listed, but only abnormal results are displayed) Labs Reviewed  URINALYSIS, ROUTINE W REFLEX MICROSCOPIC - Abnormal; Notable for the following components:      Result Value   APPearance CLOUDY (*)    All other components within normal limits  POC URINE  PREG, ED    Radiology No results found.  Procedures Procedures (including critical care time)  Medications Ordered in ED Medications  acetaminophen (TYLENOL) tablet 650 mg (650 mg Oral Given 03/01/17 1244)  albuterol (PROVENTIL) (2.5 MG/3ML) 0.083% nebulizer solution 5 mg (5 mg Nebulization Given 03/01/17 1244)  0.9 %  sodium chloride infusion ( Intravenous Stopped 03/01/17 1620)  diphenhydrAMINE (BENADRYL) injection 25 mg (25 mg Intravenous Given 03/01/17 1512)  dexamethasone (DECADRON) injection 10 mg (10 mg Intravenous Given 03/01/17 1512)  metoCLOPramide (REGLAN) injection 10 mg (10 mg Intravenous Given 03/01/17 1512)   4:45 pm headache completely resolved with IV hydration and medication. Patient reports that she feels much better and drinking PO fluids and ready to go home.   Initial Impression / Assessment and Plan / ED Course  I have reviewed the triage vital signs and the nursing notes. 27 y.o. female here with cough and congestin fever and chills Pt CXR negative for acute infiltrate. Patients symptoms are consistent with  URI, likely viral etiology. Discussed that antibiotics are not indicated for viral infections. Pt will be discharged with symptomatic treatment.  Verbalizes understanding and is agreeable with plan. Pt is hemodynamically stable & in NAD prior to dc. SUBJECTIVE: Kathy Flores is a 27 y.o. female who complains of headaches for 2 days Description of pain: bilateral in the frontal area. Duration of individual headaches: Associated symptoms: congestion, facial pain, light sensitivity and nausea. Pain relief: nothing tried. She does a history of migraine headaches.  OBJECTIVE: Appearance: alert, well appearing, and in no distress. Neurological Exam: alert, oriented, normal speech, no focal findings or movement disorder noted, neck supple without rigidity, DTR's normal and symmetric, Romberg sign negative, normal gait and station.  ASSESSMENT: URI symptoms with migraine headache that resolved with treatment in the ED. Patient stable for d/c without neuro deficits.   PLAN: Recommendations: will treat URI symptoms and patient to f/u with PCP later this week. Return precautions discussed.  Final Clinical Impressions(s) / ED Diagnoses   Final diagnoses:  Acute URI  Bad headache    ED Discharge Orders        Ordered    benzonatate (TESSALON) 200 MG capsule  3 times daily PRN     03/01/17 1701       Janit Bern Goodman, NP 03/01/17 2234    Dorie Rank, MD 03/02/17 1159

## 2017-03-01 NOTE — ED Notes (Signed)
Patient transported to X-ray 

## 2017-06-18 ENCOUNTER — Ambulatory Visit (HOSPITAL_COMMUNITY)
Admission: EM | Admit: 2017-06-18 | Discharge: 2017-06-18 | Disposition: A | Discharge status: Home / Self Care | Payer: Medicaid Other | Attending: Emergency Medicine | Admitting: Emergency Medicine

## 2017-06-18 ENCOUNTER — Encounter (HOSPITAL_COMMUNITY): Payer: Self-pay

## 2017-06-18 DIAGNOSIS — T7840XA Allergy, unspecified, initial encounter: Secondary | ICD-10-CM

## 2017-06-18 MED ORDER — EPINEPHRINE 0.3 MG/0.3ML IJ SOAJ: 0.3000 mg | Freq: Once | INTRAMUSCULAR | 1 refills | Status: AC

## 2017-06-18 MED ORDER — NITROFURANTOIN MONOHYD MACRO 100 MG PO CAPS: 100.0000 mg | ORAL_CAPSULE | Freq: Two times a day (BID) | ORAL | 0 refills | Status: DC

## 2017-06-18 NOTE — ED Provider Notes (Signed)
HPI  SUBJECTIVE:  Kathy Flores is a 27 y.o. female who presents with facial itching starting last night.  States that she woke up this morning with puffy, itchy, watery eyes and itchy nose this morning.  She is currently on Keflex for UTI which she started 2 or 3 days ago.  She is also currently on Flagyl, but states that she has taken this many times before without any problem.  She has never taken Keflex before.  She denies any other new lotions, soaps, detergents, food or drink.  She denies tongue or lip swelling, difficulty breathing, shortness of breath, wheezing, abdominal pain, diarrhea, rash, hives, erythema, syncope, lip tingling.  There are no aggravating or alleviating factors.  She has not tried anything for this.  She has not had any other change in her baseline medications.  She has a past medical history of asthma, allergies.  She reports hives when she takes penicillins.  No history of diabetes, anaphylaxis.  LMP: 5/23.  Denies the possibility of being pregnant.  PMD: [facility].   Past Medical History:  Diagnosis Date  . Asthma    inhaler used 3x month  . Depression   . Fibroid   . Trichomonas infection   . Vaginal Pap smear, abnormal     Past Surgical History:  Procedure Laterality Date  . CESAREAN SECTION N/A 11/23/2013   Procedure: CESAREAN SECTION;  Surgeon: Woodroe Mode, MD;  Location: [facility];  Service: Obstetrics;  Laterality: N/A;    Family History  Problem Relation Age of Onset  . Asthma Sister   . Healthy Mother   . Healthy Father     Social History   Tobacco Use  . Smoking status: Former Research scientist (life sciences)  . Smokeless tobacco: Never Used  Substance Use Topics  . Alcohol use: No  . Drug use: No    No current facility-administered medications for this encounter.   Current Outpatient Medications:  .  traZODone (DESYREL) 50 MG tablet, Take 50 mg by mouth at bedtime as needed for sleep. , Disp: , Rfl:  .  albuterol (PROVENTIL HFA;VENTOLIN HFA)  108 (90 Base) MCG/ACT inhaler, Inhale 2 puffs into the lungs every 6 (six) hours as needed for wheezing or shortness of breath., Disp: , Rfl:  .  albuterol (PROVENTIL) (2.5 MG/3ML) 0.083% nebulizer solution, Take 2.5 mg by nebulization every 6 (six) hours as needed for wheezing or shortness of breath., Disp: , Rfl:  .  buPROPion (WELLBUTRIN XL) 150 MG 24 hr tablet, Take 150 mg by mouth daily. , Disp: , Rfl:  .  busPIRone (BUSPAR) 5 MG tablet, Take 5 mg by mouth 3 (three) times daily., Disp: , Rfl:  .  EPINEPHrine 0.3 mg/0.3 mL IJ SOAJ injection, Inject 0.3 mLs (0.3 mg total) into the muscle once for 1 dose., Disp: 1 Device, Rfl: 1 .  nitrofurantoin, macrocrystal-monohydrate, (MACROBID) 100 MG capsule, Take 1 capsule (100 mg total) by mouth 2 (two) times daily. X 5 days, Disp: 10 capsule, Rfl: 0 .  topiramate (TOPAMAX) 50 MG tablet, One tab at bed time drink plenty of water, Disp: , Rfl:   Allergies  Allergen Reactions  . Keflet [Cephalexin] Itching  . Penicillins Hives and Rash    Has patient had a PCN reaction causing immediate rash, facial/tongue/throat swelling, SOB or lightheadedness with hypotension: Yes Has patient had a PCN reaction causing severe rash involving mucus membranes or skin necrosis: No Has patient had a PCN reaction that required hospitalization No Has  patient had a PCN reaction occurring within the last 10 years: No If all of the above answers are "NO", then may proceed with Cephalosporin use.      ROS  As noted in HPI.   Physical Exam  BP 122/81 (BP Location: Right Arm)   Pulse 66   Temp 98.4 F (36.9 C) (Oral)   Resp 19   LMP 05/28/2017   SpO2 100%   Constitutional: Well developed, well nourished, no acute distress Eyes:  EOMI, conjunctiva normal bilaterally HENT: Normocephalic, atraumatic,mucus membranes moist. no angioedema of the lips or tongue.  No appreciable facial swelling.  Airway widely patent. Respiratory: Normal inspiratory effort, lungs clear  bilaterally, no wheezing Cardiovascular: Normal rate regular rhythm, no murmurs, rubs, gallops GI: nondistended skin: No rash, facial erythema, urticaria over extremities, skin intact Musculoskeletal: no deformities Neurologic: Alert & oriented x 3, no focal neuro deficits Psychiatric: Speech and behavior appropriate   ED Course   Medications - No data to display  No orders of the defined types were placed in this encounter.   No results found for this or any previous visit (from the past 24 hour(s)). No results found.  ED Clinical Impression  Allergic reaction to drug, initial encounter   ED Assessment/Plan  Suspect adverse reaction versus early allergic reaction.  From the Keflex.  There is no evidence of anaphylaxis.  We will have her discontinue the Keflex, put her on Macrobid for 5 days for her UTI.  Advised an antihistamine of her choice such as Claritin, Allegra, or Zyrtec.  We will also send home with an EpiPen.  Discussed signs of anaphylaxis and advised her to use this, take 50 mg of Benadryl and go immediately to the ER for any signs of anaphylaxis.  Discussed MDM, treatment plan, and plan for follow-up with patient. Discussed sn/sx that should prompt return to the ED. patient agrees with plan.   Meds ordered this encounter  Medications  . nitrofurantoin, macrocrystal-monohydrate, (MACROBID) 100 MG capsule    Sig: Take 1 capsule (100 mg total) by mouth 2 (two) times daily. X 5 days    Dispense:  10 capsule    Refill:  0  . EPINEPHrine 0.3 mg/0.3 mL IJ SOAJ injection    Sig: Inject 0.3 mLs (0.3 mg total) into the muscle once for 1 dose.    Dispense:  1 Device    Refill:  1    *This clinic note was created using [employer]. Therefore, there may be occasional mistakes despite careful proofreading.   ?   Melynda Ripple, MD 06/18/17 [phone]

## 2017-06-18 NOTE — ED Triage Notes (Signed)
Pt states she believes she is having an allergic reaction to recent medication. Pt states she feels itchness and swelling in her face and upper extremities.

## 2017-08-04 ENCOUNTER — Encounter (HOSPITAL_COMMUNITY): Payer: Self-pay | Admitting: Emergency Medicine

## 2017-08-04 ENCOUNTER — Other Ambulatory Visit: Payer: Self-pay

## 2017-08-04 ENCOUNTER — Emergency Department (HOSPITAL_COMMUNITY)
Admission: EM | Admit: 2017-08-04 | Discharge: 2017-08-04 | Disposition: A | Discharge status: Home / Self Care | Payer: Medicaid Other | Attending: Emergency Medicine | Admitting: Emergency Medicine

## 2017-08-04 DIAGNOSIS — Z87891 Personal history of nicotine dependence: Secondary | ICD-10-CM | POA: Diagnosis not present

## 2017-08-04 DIAGNOSIS — Z79899 Other long term (current) drug therapy: Secondary | ICD-10-CM | POA: Insufficient documentation

## 2017-08-04 DIAGNOSIS — R197 Diarrhea, unspecified: Secondary | ICD-10-CM | POA: Diagnosis present

## 2017-08-04 DIAGNOSIS — J45901 Unspecified asthma with (acute) exacerbation: Secondary | ICD-10-CM | POA: Diagnosis not present

## 2017-08-04 DIAGNOSIS — K529 Noninfective gastroenteritis and colitis, unspecified: Secondary | ICD-10-CM

## 2017-08-04 LAB — CBC
HCT: 42.2 % (ref 36.0–46.0)
Hemoglobin: 13.1 g/dL (ref 12.0–15.0)
MCH: 27.6 pg (ref 26.0–34.0)
MCHC: 31 g/dL (ref 30.0–36.0)
MCV: 88.8 fL (ref 78.0–100.0)
Platelets: 268 10*3/uL (ref 150–400)
RBC: 4.75 MIL/uL (ref 3.87–5.11)
RDW: 13.5 % (ref 11.5–15.5)
WBC: 7.1 10*3/uL (ref 4.0–10.5)

## 2017-08-04 LAB — URINALYSIS, ROUTINE W REFLEX MICROSCOPIC
BILIRUBIN URINE: NEGATIVE
Bacteria, UA: NONE SEEN
GLUCOSE, UA: NEGATIVE mg/dL
Hgb urine dipstick: NEGATIVE
KETONES UR: NEGATIVE mg/dL
Nitrite: NEGATIVE
PH: 5 (ref 5.0–8.0)
Protein, ur: NEGATIVE mg/dL
Specific Gravity, Urine: 1.026 (ref 1.005–1.030)

## 2017-08-04 LAB — COMPREHENSIVE METABOLIC PANEL
ALK PHOS: 56 U/L (ref 38–126)
ALT: 22 U/L (ref 0–44)
AST: 16 U/L (ref 15–41)
Albumin: 3.6 g/dL (ref 3.5–5.0)
Anion gap: 8 (ref 5–15)
BUN: 13 mg/dL (ref 6–20)
CALCIUM: 8.7 mg/dL — AB (ref 8.9–10.3)
CO2: 20 mmol/L — ABNORMAL LOW (ref 22–32)
CREATININE: 1.03 mg/dL — AB (ref 0.44–1.00)
Chloride: 108 mmol/L (ref 98–111)
GFR calc Af Amer: >60 mL/min (ref >60)
Glucose, Bld: 86 mg/dL (ref 70–99)
Potassium: 3.9 mmol/L (ref 3.5–5.1)
Sodium: 136 mmol/L (ref 135–145)
Total Bilirubin: 0.5 mg/dL (ref 0.3–1.2)
Total Protein: 7.3 g/dL (ref 6.5–8.1)

## 2017-08-04 LAB — I-STAT BETA HCG BLOOD, ED (MC, WL, AP ONLY): I-stat hCG, quantitative: <5 m[IU]/mL (ref <5)

## 2017-08-04 LAB — LIPASE, BLOOD: Lipase: 41 U/L (ref 11–51)

## 2017-08-04 MED ORDER — LOPERAMIDE HCL 2 MG PO CAPS: 2.0000 mg | ORAL_CAPSULE | Freq: Four times a day (QID) | ORAL | 0 refills | Status: DC | PRN

## 2017-08-04 MED ORDER — ONDANSETRON 4 MG PO TBDP
4.0000 mg | ORAL_TABLET | Freq: Once | ORAL | Status: AC
Start: 2017-08-04 — End: 2017-08-04
  Administered 2017-08-04: 4 mg via ORAL
  Filled 2017-08-04: qty 1

## 2017-08-04 NOTE — Discharge Instructions (Addendum)
Please read attached information. If you experience any new or worsening signs or symptoms please return to the emergency room for evaluation. Please follow-up with your primary care provider or specialist as discussed. Please use medication prescribed only as directed and discontinue taking if you have any concerning signs or symptoms.   °

## 2017-08-04 NOTE — ED Triage Notes (Signed)
Pt. Stated, Im dehydrated, Im having N/V/D since last night.

## 2017-08-04 NOTE — ED Provider Notes (Signed)
[facility] EMERGENCY DEPARTMENT Provider Note   CSN: [id number] Arrival date & time: 08/04/17  1002     History   Chief Complaint Chief Complaint  Patient presents with  . Emesis  . Nausea  . Diarrhea    HPI Kathy ROTHENBERGER is a 27 y.o. female.  HPI   37 YOF presents today with complaints of nausea and diarrhea. Pt notes last night she had several episodes of non blood diarrhea with associated nausea, no vomiting. She denies any associated abd pain, vaginal bleeding or discharge, or fever. No recent antibiotics. Family sick with similar symptoms several days prior. Patient does note that she had chicken the night before symptoms started. She denies urinary symptoms. Patient is taking Pepto-Bismol last night.   Past Medical History:  Diagnosis Date  . Asthma    inhaler used 3x month  . Depression   . Fibroid   . Trichomonas infection   . Vaginal Pap smear, abnormal     Patient Active Problem List   Diagnosis Date Noted  . Asthma exacerbation   . Metabolic acidosis   . Acute respiratory failure with hypoxemia ([place]) 09/23/2015  . SIRS (systemic inflammatory response syndrome) ([place]) 09/23/2015  . Normochromic normocytic anemia 09/23/2015  . Lactic acidosis   . Encounter for observation of infant for suspected infection   . Post term pregnancy, 41 weeks 11/22/2013  . Post-term pregnancy, 40-42 weeks of gestation   . [redacted] weeks gestation of pregnancy     Past Surgical History:  Procedure Laterality Date  . CESAREAN SECTION N/A 11/23/2013   Procedure: CESAREAN SECTION;  Surgeon: Woodroe Mode, MD;  Location: [facility];  Service: Obstetrics;  Laterality: N/A;     OB History    Gravida  3   Para  3   Term  3   Preterm      AB      Living  3     SAB      TAB      Ectopic      Multiple  0   Live Births  3            Home Medications    Prior to Admission medications   Medication Sig Start Date End Date Taking? Authorizing  Provider  albuterol (PROVENTIL HFA;VENTOLIN HFA) 108 (90 Base) MCG/ACT inhaler Inhale 2 puffs into the lungs every 6 (six) hours as needed for wheezing or shortness of breath.    [provider]  albuterol (PROVENTIL) (2.5 MG/3ML) 0.083% nebulizer solution Take 2.5 mg by nebulization every 6 (six) hours as needed for wheezing or shortness of breath.    [provider]  buPROPion (WELLBUTRIN XL) 150 MG 24 hr tablet Take 150 mg by mouth daily.  04/28/16 04/28/17  [provider]  busPIRone (BUSPAR) 5 MG tablet Take 5 mg by mouth 3 (three) times daily.    [provider]  loperamide (IMODIUM) 2 MG capsule Take 1 capsule (2 mg total) by mouth 4 (four) times daily as needed for diarrhea or loose stools. 08/04/17   Avary Eichenberger, Dellis Filbert, PA-C  nitrofurantoin, macrocrystal-monohydrate, (MACROBID) 100 MG capsule Take 1 capsule (100 mg total) by mouth 2 (two) times daily. X 5 days 06/18/17   Melynda Ripple, MD  topiramate (TOPAMAX) 50 MG tablet One tab at bed time drink plenty of water 05/19/16 10/19/16  [provider]  traZODone (DESYREL) 50 MG tablet Take 50 mg by mouth at bedtime as needed for sleep.  [provider]    Family History Family History  Problem Relation Age of Onset  . Asthma Sister   . Healthy Mother   . Healthy Father     Social History Social History   Tobacco Use  . Smoking status: Former Research scientist (life sciences)  . Smokeless tobacco: Never Used  Substance Use Topics  . Alcohol use: No  . Drug use: No     Allergies   Keflet [cephalexin] and Penicillins   Review of Systems Review of Systems  All other systems reviewed and are negative.  Physical Exam Updated Vital Signs BP (!) 113/55 (BP Location: Right Arm)   Pulse 77   Temp 98.7 F (37.1 C) (Oral)   Resp 16   Ht 5\' 3"  (1.6 m)   LMP 06/28/2017   SpO2 100%   BMI 35.96 kg/m   Physical Exam  Constitutional: She is oriented to person, place, and time. She appears  well-developed and well-nourished.  HENT:  Head: Normocephalic and atraumatic.  Eyes: Pupils are equal, round, and reactive to light. Conjunctivae are normal. Right eye exhibits no discharge. Left eye exhibits no discharge. No scleral icterus.  Neck: Normal range of motion. No JVD present. No tracheal deviation present.  Pulmonary/Chest: Effort normal. No stridor.  Abdominal: Soft. She exhibits no distension and no mass. There is no tenderness. There is no rebound and no guarding. No hernia.  Neurological: She is alert and oriented to person, place, and time. Coordination normal.  Psychiatric: She has a normal mood and affect. Her behavior is normal. Judgment and thought content normal.  Nursing note and vitals reviewed.     ED Treatments / Results  Labs (all labs ordered are listed, but only abnormal results are displayed) Labs Reviewed  COMPREHENSIVE METABOLIC PANEL - Abnormal; Notable for the following components:      Result Value   CO2 20 (*)    Creatinine, Ser 1.03 (*)    Calcium 8.7 (*)    All other components within normal limits  URINALYSIS, ROUTINE W REFLEX MICROSCOPIC - Abnormal; Notable for the following components:   APPearance HAZY (*)    Leukocytes, UA SMALL (*)    All other components within normal limits  LIPASE, BLOOD  CBC  I-STAT BETA HCG BLOOD, ED ([facility], [facility], [facility] ONLY)    EKG None  Radiology No results found.  Procedures Procedures (including critical care time)  Medications Ordered in ED Medications  ondansetron (ZOFRAN-ODT) disintegrating tablet 4 mg (4 mg Oral Given 08/04/17 1349)     Initial Impression / Assessment and Plan / ED Course  I have reviewed the triage vital signs and the nursing notes.  Pertinent labs & imaging results that were available during my care of the patient were reviewed by me and considered in my medical decision making (see chart for details).     Labs: urinalysis, CBC, CMP, I-STAT beta-hCG,  lipase  Imaging:  Consults:  Therapeutics:  Discharge Meds:    Assessment/Plan:  27 year old female since today with likely viral gastroenteritis. Reassuring workup with no significant abnormalities noted on laboratory analysis or vital signs. Tolerating by mouth, no abdominal pain.  TURP cautions given.   Final Clinical Impressions(s) / ED Diagnoses   Final diagnoses:  Gastroenteritis    ED Discharge Orders        Ordered    loperamide (IMODIUM) 2 MG capsule  4 times daily PRN     08/04/17 1454       Okey Regal, PA-C 08/04/17 1459  Charlesetta Shanks, MD 08/10/17 [phone]

## 2017-08-10 ENCOUNTER — Encounter (HOSPITAL_COMMUNITY): Payer: Self-pay

## 2017-08-10 ENCOUNTER — Emergency Department (HOSPITAL_COMMUNITY)
Admission: EM | Admit: 2017-08-10 | Discharge: 2017-08-10 | Disposition: A | Discharge status: Home / Self Care | Payer: Self-pay | Attending: Emergency Medicine | Admitting: Emergency Medicine

## 2017-08-10 ENCOUNTER — Other Ambulatory Visit: Payer: Self-pay

## 2017-08-10 ENCOUNTER — Emergency Department (HOSPITAL_COMMUNITY): Payer: Self-pay

## 2017-08-10 DIAGNOSIS — Z87891 Personal history of nicotine dependence: Secondary | ICD-10-CM | POA: Insufficient documentation

## 2017-08-10 DIAGNOSIS — J069 Acute upper respiratory infection, unspecified: Secondary | ICD-10-CM | POA: Insufficient documentation

## 2017-08-10 DIAGNOSIS — J45909 Unspecified asthma, uncomplicated: Secondary | ICD-10-CM | POA: Insufficient documentation

## 2017-08-10 DIAGNOSIS — Z79899 Other long term (current) drug therapy: Secondary | ICD-10-CM | POA: Insufficient documentation

## 2017-08-10 DIAGNOSIS — B9789 Other viral agents as the cause of diseases classified elsewhere: Secondary | ICD-10-CM | POA: Insufficient documentation

## 2017-08-10 LAB — GROUP A STREP BY PCR: GROUP A STREP BY PCR: NOT DETECTED

## 2017-08-10 MED ORDER — BENZONATATE 100 MG PO CAPS: 100.0000 mg | ORAL_CAPSULE | Freq: Three times a day (TID) | ORAL | 0 refills | Status: DC

## 2017-08-10 MED ORDER — ACETAMINOPHEN 325 MG PO TABS
650.0000 mg | ORAL_TABLET | Freq: Once | ORAL | Status: AC | PRN
  Administered 2017-08-10: 650 mg via ORAL
  Filled 2017-08-10: qty 2

## 2017-08-10 NOTE — ED Triage Notes (Signed)
Pt endorses sore throat since last night and her asthma has been acting up during same time. 102.2 fever in triage. Denies taking any meds.

## 2017-08-10 NOTE — ED Notes (Signed)
Pt verbalizes understanding of d/c instructions. Pt received prescriptions. Pt ambulatory at d/c with all belongings.  

## 2017-08-10 NOTE — ED Notes (Signed)
Patient transported to X-ray 

## 2017-08-10 NOTE — ED Notes (Signed)
ED Provider at bedside. 

## 2017-08-10 NOTE — Discharge Instructions (Signed)
Please read attached information. If you experience any new or worsening signs or symptoms please return to the emergency room for evaluation. Please follow-up with your primary care provider or specialist as discussed. Please use medication prescribed only as directed and discontinue taking if you have any concerning signs or symptoms.   °

## 2017-08-10 NOTE — ED Provider Notes (Signed)
[facility] EMERGENCY DEPARTMENT Provider Note   CSN: [id number] Arrival date & time: 08/10/17  1616   History   Chief Complaint Chief Complaint  Patient presents with  . Sore Throat    HPI Kathy Flores is a 27 y.o. female.  HPI   27 year old female presents today with complaints of sore throat and cough. Patient notes symptoms started last night with cough, upper respiratory congestion and sore throat. Patient is she's been using her inhaler at home without significant improvement in symptoms. She denied any fever last night, but notes fever here. Patient denies any close sick contacts.    Past Medical History:  Diagnosis Date  . Asthma    inhaler used 3x month  . Depression   . Fibroid   . Trichomonas infection   . Vaginal Pap smear, abnormal     Patient Active Problem List   Diagnosis Date Noted  . Asthma exacerbation   . Metabolic acidosis   . Acute respiratory failure with hypoxemia ([place]) 09/23/2015  . SIRS (systemic inflammatory response syndrome) ([place]) 09/23/2015  . Normochromic normocytic anemia 09/23/2015  . Lactic acidosis   . Encounter for observation of infant for suspected infection   . Post term pregnancy, 41 weeks 11/22/2013  . Post-term pregnancy, 40-42 weeks of gestation   . [redacted] weeks gestation of pregnancy     Past Surgical History:  Procedure Laterality Date  . CESAREAN SECTION N/A 11/23/2013   Procedure: CESAREAN SECTION;  Surgeon: Woodroe Mode, MD;  Location: [facility];  Service: Obstetrics;  Laterality: N/A;     OB History    Gravida  3   Para  3   Term  3   Preterm      AB      Living  3     SAB      TAB      Ectopic      Multiple  0   Live Births  3            Home Medications    Prior to Admission medications   Medication Sig Start Date End Date Taking? Authorizing Provider  albuterol (PROVENTIL HFA;VENTOLIN HFA) 108 (90 Base) MCG/ACT inhaler Inhale 2 puffs into the lungs every 6 (six)  hours as needed for wheezing or shortness of breath.    [provider]  albuterol (PROVENTIL) (2.5 MG/3ML) 0.083% nebulizer solution Take 2.5 mg by nebulization every 6 (six) hours as needed for wheezing or shortness of breath.    [provider]  benzonatate (TESSALON) 100 MG capsule Take 1 capsule (100 mg total) by mouth every 8 (eight) hours. 08/10/17   Citlaly Camplin, Dellis Filbert, PA-C  buPROPion (WELLBUTRIN XL) 150 MG 24 hr tablet Take 150 mg by mouth daily.  04/28/16 04/28/17  [provider]  busPIRone (BUSPAR) 5 MG tablet Take 5 mg by mouth 3 (three) times daily.    [provider]  loperamide (IMODIUM) 2 MG capsule Take 1 capsule (2 mg total) by mouth 4 (four) times daily as needed for diarrhea or loose stools. 08/04/17   Hallee Mckenny, Dellis Filbert, PA-C  nitrofurantoin, macrocrystal-monohydrate, (MACROBID) 100 MG capsule Take 1 capsule (100 mg total) by mouth 2 (two) times daily. X 5 days 06/18/17   Melynda Ripple, MD  topiramate (TOPAMAX) 50 MG tablet One tab at bed time drink plenty of water 05/19/16 10/19/16  [provider]  traZODone (DESYREL) 50 MG tablet Take 50 mg by mouth at bedtime as needed for  sleep.     [provider]    Family History Family History  Problem Relation Age of Onset  . Asthma Sister   . Healthy Mother   . Healthy Father     Social History Social History   Tobacco Use  . Smoking status: Former Research scientist (life sciences)  . Smokeless tobacco: Never Used  Substance Use Topics  . Alcohol use: No  . Drug use: No     Allergies   Keflet [cephalexin] and Penicillins   Review of Systems Review of Systems  All other systems reviewed and are negative.    Physical Exam Updated Vital Signs BP (!) 101/46   Pulse 90   Temp 100 F (37.8 C) (Oral)   Resp 16   Ht 5\' 3"  (1.6 m)   Wt 88.5 kg (195 lb)   LMP 08/06/2017 (Exact Date)   SpO2 99%   Breastfeeding? No   BMI 34.54 kg/m   Physical Exam  Constitutional: She is oriented to  person, place, and time. She appears well-developed and well-nourished.  HENT:  Head: Normocephalic and atraumatic.  Eyes: Pupils are equal, round, and reactive to light. Conjunctivae are normal. Right eye exhibits no discharge. Left eye exhibits no discharge. No scleral icterus.  Neck: Normal range of motion. No JVD present. No tracheal deviation present.  Pulmonary/Chest: Effort normal. No stridor.  Neurological: She is alert and oriented to person, place, and time. Coordination normal.  Psychiatric: She has a normal mood and affect. Her behavior is normal. Judgment and thought content normal.  Nursing note and vitals reviewed.   ED Treatments / Results  Labs (all labs ordered are listed, but only abnormal results are displayed) Labs Reviewed  GROUP A STREP BY PCR    EKG None  Radiology Dg Chest 2 View  Result Date: 08/10/2017 CLINICAL DATA:  Fever and cough EXAM: CHEST - 2 VIEW COMPARISON:  03/01/2017 FINDINGS: The heart size and mediastinal contours are within normal limits. Both lungs are clear. The visualized skeletal structures are unremarkable. IMPRESSION: No active cardiopulmonary disease. Electronically Signed   By: Ulyses Jarred M.D.   On: 08/10/2017 21:14    Procedures Procedures (including critical care time)  Medications Ordered in ED Medications  acetaminophen (TYLENOL) tablet 650 mg (650 mg Oral Given 08/10/17 1702)     Initial Impression / Assessment and Plan / ED Course  I have reviewed the triage vital signs and the nursing notes.  Pertinent labs & imaging results that were available during my care of the patient were reviewed by me and considered in my medical decision making (see chart for details).     Labs: Group A strep  Imaging: DG chest 2 view   Consults:  Therapeutics:  Discharge Meds:   Assessment/Plan: 27 year old female presents today with likely viral URI. She is well-appearing in no acute distress. She has clear lung sounds with no  wheeze. Negative chest x-ray. She is febrile here.patient does have albuterol at home.  Very low suspicion for acute bacterial infection, patient will be discharged with symptomatic care, strict return precautions. She verbalized understanding and agreement to today's plan.    Final Clinical Impressions(s) / ED Diagnoses   Final diagnoses:  Viral URI with cough    ED Discharge Orders        Ordered    benzonatate (TESSALON) 100 MG capsule  Every 8 hours     08/10/17 2135       Okey Regal, PA-C 08/10/17 2145    Bero,  Barth Kirks, MD 08/10/17 2209

## 2017-11-06 DIAGNOSIS — Z113 Encounter for screening for infections with a predominantly sexual mode of transmission: Secondary | ICD-10-CM | POA: Diagnosis not present

## 2017-11-06 DIAGNOSIS — Z124 Encounter for screening for malignant neoplasm of cervix: Secondary | ICD-10-CM | POA: Diagnosis not present

## 2017-11-06 DIAGNOSIS — N760 Acute vaginitis: Secondary | ICD-10-CM | POA: Diagnosis not present

## 2017-11-06 DIAGNOSIS — Z01419 Encounter for gynecological examination (general) (routine) without abnormal findings: Secondary | ICD-10-CM | POA: Diagnosis not present

## 2017-11-06 DIAGNOSIS — L508 Other urticaria: Secondary | ICD-10-CM | POA: Diagnosis not present

## 2017-11-06 DIAGNOSIS — F411 Generalized anxiety disorder: Secondary | ICD-10-CM | POA: Diagnosis not present

## 2017-11-06 DIAGNOSIS — Z1151 Encounter for screening for human papillomavirus (HPV): Secondary | ICD-10-CM | POA: Diagnosis not present

## 2017-11-06 DIAGNOSIS — Z118 Encounter for screening for other infectious and parasitic diseases: Secondary | ICD-10-CM | POA: Diagnosis not present

## 2017-12-14 ENCOUNTER — Ambulatory Visit: Payer: BLUE CROSS/BLUE SHIELD | Admitting: Psychology

## 2018-02-20 ENCOUNTER — Other Ambulatory Visit: Payer: Self-pay

## 2018-02-20 ENCOUNTER — Encounter (HOSPITAL_COMMUNITY): Payer: Self-pay | Admitting: Emergency Medicine

## 2018-02-20 ENCOUNTER — Emergency Department (HOSPITAL_COMMUNITY)
Admission: EM | Admit: 2018-02-20 | Discharge: 2018-02-21 | Disposition: A | Discharge status: Home / Self Care | Payer: BLUE CROSS/BLUE SHIELD | Attending: Emergency Medicine | Admitting: Emergency Medicine

## 2018-02-20 ENCOUNTER — Emergency Department (HOSPITAL_COMMUNITY): Payer: BLUE CROSS/BLUE SHIELD

## 2018-02-20 DIAGNOSIS — Z79899 Other long term (current) drug therapy: Secondary | ICD-10-CM | POA: Diagnosis not present

## 2018-02-20 DIAGNOSIS — J45909 Unspecified asthma, uncomplicated: Secondary | ICD-10-CM | POA: Insufficient documentation

## 2018-02-20 DIAGNOSIS — R1031 Right lower quadrant pain: Secondary | ICD-10-CM | POA: Diagnosis not present

## 2018-02-20 DIAGNOSIS — N739 Female pelvic inflammatory disease, unspecified: Secondary | ICD-10-CM | POA: Insufficient documentation

## 2018-02-20 DIAGNOSIS — N730 Acute parametritis and pelvic cellulitis: Secondary | ICD-10-CM | POA: Diagnosis not present

## 2018-02-20 DIAGNOSIS — N3000 Acute cystitis without hematuria: Secondary | ICD-10-CM | POA: Diagnosis not present

## 2018-02-20 DIAGNOSIS — Z87891 Personal history of nicotine dependence: Secondary | ICD-10-CM | POA: Insufficient documentation

## 2018-02-20 DIAGNOSIS — E871 Hypo-osmolality and hyponatremia: Secondary | ICD-10-CM | POA: Diagnosis not present

## 2018-02-20 DIAGNOSIS — R102 Pelvic and perineal pain: Secondary | ICD-10-CM | POA: Diagnosis present

## 2018-02-20 LAB — I-STAT BETA HCG BLOOD, ED (MC, WL, AP ONLY): I-stat hCG, quantitative: <5 m[IU]/mL (ref <5)

## 2018-02-20 LAB — URINALYSIS, ROUTINE W REFLEX MICROSCOPIC
Bacteria, UA: NONE SEEN
Bilirubin Urine: NEGATIVE
Glucose, UA: NEGATIVE mg/dL
Ketones, ur: NEGATIVE mg/dL
Nitrite: NEGATIVE
PROTEIN: NEGATIVE mg/dL
SPECIFIC GRAVITY, URINE: 1.018 (ref 1.005–1.030)
pH: 6 (ref 5.0–8.0)

## 2018-02-20 LAB — WET PREP, GENITAL
Clue Cells Wet Prep HPF POC: NONE SEEN
SPERM: NONE SEEN
Yeast Wet Prep HPF POC: NONE SEEN

## 2018-02-20 MED ORDER — ACETAMINOPHEN 500 MG PO TABS
1000.0000 mg | ORAL_TABLET | Freq: Once | ORAL | Status: AC
  Administered 2018-02-20: 1000 mg via ORAL
  Filled 2018-02-20: qty 2

## 2018-02-20 NOTE — ED Provider Notes (Signed)
[facility] EMERGENCY DEPARTMENT Provider Note   CSN: [id number] Arrival date & time: 02/20/18  2143     History   Chief Complaint Chief Complaint  Patient presents with  . Pelvic Pain    HPI Kathy Flores is a 28 y.o. female.  HPI  Patient is a 28 year old female with a history of asthma, STI presenting for pelvic pain.  Patient reports that she began having pelvic pain when she was on her menstrual cycle approximately 6 days ago.  She reports it is worsened throughout the week.  Patient reports that she had a normal cycle for her approximately 5 days.  She has a regular menses approximately every 3 months.  She denies concern for pregnancy, however she does not use any birth control.  Patient reports that the pain is crampy in nature but worse than her menstrual cramps.  She reports that the pain persists after her menses have ended.  This pain is bilateral and not focal on the right or left lower abdomen.  It radiates to her back.  Patient denies any nausea, vomiting, fevers.  She does report that she has had some increased vaginal discharge but that is typical for her during her menses.  Patient denies dysuria, urgency, or frequency.  Abdominal surgical history significant for cesarean section in 2015.  Patient reports 3 sexual partners in the last 6 months.  Patient reports engaging in unprotected oral and vaginal intercourse.  She states that she has been with her current partner for 1 month, however states that she may have been exposed to STI from her previous partner 1 month ago.  Past Medical History:  Diagnosis Date  . Asthma    inhaler used 3x month  . Depression   . Fibroid   . Trichomonas infection   . Vaginal Pap smear, abnormal     Patient Active Problem List   Diagnosis Date Noted  . Asthma exacerbation   . Metabolic acidosis   . Acute respiratory failure with hypoxemia ([place]) 09/23/2015  . SIRS (systemic inflammatory response syndrome)  ([place]) 09/23/2015  . Normochromic normocytic anemia 09/23/2015  . Lactic acidosis   . Encounter for observation of infant for suspected infection   . Post term pregnancy, 41 weeks 11/22/2013  . Post-term pregnancy, 40-42 weeks of gestation   . [redacted] weeks gestation of pregnancy     Past Surgical History:  Procedure Laterality Date  . CESAREAN SECTION N/A 11/23/2013   Procedure: CESAREAN SECTION;  Surgeon: Woodroe Mode, MD;  Location: [facility];  Service: Obstetrics;  Laterality: N/A;     OB History    Gravida  3   Para  3   Term  3   Preterm      AB      Living  3     SAB      TAB      Ectopic      Multiple  0   Live Births  3            Home Medications    Prior to Admission medications   Medication Sig Start Date End Date Taking? Authorizing Provider  albuterol (PROVENTIL HFA;VENTOLIN HFA) 108 (90 Base) MCG/ACT inhaler Inhale 2 puffs into the lungs every 6 (six) hours as needed for wheezing or shortness of breath.    [provider]  albuterol (PROVENTIL) (2.5 MG/3ML) 0.083% nebulizer solution Take 2.5 mg by nebulization every 6 (six) hours as needed for wheezing or  shortness of breath.    [provider]  benzonatate (TESSALON) 100 MG capsule Take 1 capsule (100 mg total) by mouth every 8 (eight) hours. 08/10/17   Hedges, Dellis Filbert, PA-C  buPROPion (WELLBUTRIN XL) 150 MG 24 hr tablet Take 150 mg by mouth daily.  04/28/16 04/28/17  [provider]  busPIRone (BUSPAR) 5 MG tablet Take 5 mg by mouth 3 (three) times daily.    [provider]  loperamide (IMODIUM) 2 MG capsule Take 1 capsule (2 mg total) by mouth 4 (four) times daily as needed for diarrhea or loose stools. 08/04/17   Hedges, Dellis Filbert, PA-C  nitrofurantoin, macrocrystal-monohydrate, (MACROBID) 100 MG capsule Take 1 capsule (100 mg total) by mouth 2 (two) times daily. X 5 days 06/18/17   Melynda Ripple, MD  topiramate (TOPAMAX) 50 MG tablet One tab at bed time drink plenty  of water 05/19/16 10/19/16  [provider]  traZODone (DESYREL) 50 MG tablet Take 50 mg by mouth at bedtime as needed for sleep.     [provider]    Family History Family History  Problem Relation Age of Onset  . Asthma Sister   . Healthy Mother   . Healthy Father     Social History Social History   Tobacco Use  . Smoking status: Former Research scientist (life sciences)  . Smokeless tobacco: Never Used  Substance Use Topics  . Alcohol use: No  . Drug use: No     Allergies   Keflet [cephalexin] and Penicillins   Review of Systems Review of Systems  Constitutional: Negative for chills and fever.  HENT: Negative for sore throat.   Respiratory: Negative for shortness of breath.   Cardiovascular: Negative for chest pain.  Gastrointestinal: Negative for abdominal pain, nausea and vomiting.  Genitourinary: Positive for pelvic pain and vaginal discharge. Negative for dysuria, flank pain, urgency and vaginal bleeding.  All other systems reviewed and are negative.    Physical Exam Updated Vital Signs BP 137/88 (BP Location: Right Arm)   Pulse 75   Temp 99.2 F (37.3 C) (Oral)   Resp 16   LMP 02/07/2018   SpO2 97%   Physical Exam Vitals signs and nursing note reviewed.  Constitutional:      General: She is not in acute distress.    Appearance: She is well-developed.  HENT:     Head: Normocephalic and atraumatic.     Mouth/Throat:     Mouth: Mucous membranes are moist.  Eyes:     Conjunctiva/sclera: Conjunctivae normal.     Pupils: Pupils are equal, round, and reactive to light.  Neck:     Musculoskeletal: Normal range of motion and neck supple.  Cardiovascular:     Rate and Rhythm: Normal rate and regular rhythm.     Heart sounds: S1 normal and S2 normal. No murmur.  Pulmonary:     Effort: Pulmonary effort is normal.     Breath sounds: Normal breath sounds. No wheezing or rales.  Abdominal:     General: There is no distension.     Palpations: Abdomen is soft.      Tenderness: There is abdominal tenderness. There is no guarding.     Comments: Suprapubic tenderness palpation.  No focal right lower or left lower quadrant tenderness.  Genitourinary:    Comments: Pelvic examination performed with RN chaperone present.  Patient has no palpable lymphadenopathy of the inguinal region.  Vaginal tissue pink and rugated.  Cervix is erythematous around os.  Large amount of yellow discharge.  On bimanual exam, patient is cervical motion tenderness as well as right adnexal, uterine fundus tenderness.  Minimal left adnexal tenderness. Musculoskeletal: Normal range of motion.        General: No deformity.  Lymphadenopathy:     Cervical: No cervical adenopathy.  Skin:    General: Skin is warm and dry.     Findings: No erythema or rash.  Neurological:     Mental Status: She is alert.     Comments: Cranial nerves grossly intact. Patient moves extremities symmetrically and with good coordination.  Psychiatric:        Behavior: Behavior normal.        Thought Content: Thought content normal.        Judgment: Judgment normal.      ED Treatments / Results  Labs (all labs ordered are listed, but only abnormal results are displayed) Labs Reviewed  WET PREP, GENITAL - Abnormal; Notable for the following components:      Result Value   Trich, Wet Prep PRESENT (*)    WBC, Wet Prep HPF POC MANY (*)    All other components within normal limits  URINALYSIS, ROUTINE W REFLEX MICROSCOPIC - Abnormal; Notable for the following components:   Color, Urine STRAW (*)    APPearance HAZY (*)    Hgb urine dipstick SMALL (*)    Leukocytes,Ua LARGE (*)    All other components within normal limits  CBC WITH DIFFERENTIAL/PLATELET - Abnormal; Notable for the following components:   WBC 15.9 (*)    Hemoglobin 11.4 (*)    Neutro Abs 11.7 (*)    Eosinophils Absolute 0.7 (*)    All other components within normal limits  BASIC METABOLIC PANEL - Abnormal; Notable for the following  components:   Sodium 129 (*)    CO2 17 (*)    Calcium 8.7 (*)    All other components within normal limits  RPR  HIV ANTIBODY (ROUTINE TESTING W REFLEX)  I-STAT BETA HCG BLOOD, ED ([facility], [facility], [facility] ONLY)  GC/CHLAMYDIA PROBE AMP ([place]) NOT AT [facility]    EKG None  Radiology US Transvaginal Non-ob  Result Date: 02/21/2018 CLINICAL DATA:  Right lower quadrant abdomen pain EXAM: TRANSABDOMINAL AND TRANSVAGINAL ULTRASOUND OF PELVIS DOPPLER ULTRASOUND OF OVARIES TECHNIQUE: Both transabdominal and transvaginal ultrasound examinations of the pelvis were performed. Transabdominal technique was performed for global imaging of the pelvis including uterus, ovaries, adnexal regions, and pelvic cul-de-sac. It was necessary to proceed with endovaginal exam following the transabdominal exam to visualize the ovaries. Color and duplex Doppler ultrasound was utilized to evaluate blood flow to the ovaries. COMPARISON:  None. FINDINGS: Uterus Measurements: 8.9 x 4.6 x 4.6 cm = volume: 101.9 mL. No fibroids or other mass visualized. Endometrium Thickness: 5.3 mm.  No focal abnormality visualized. Right ovary Measurements: 3.9 x 2.7 x 2.7 cm = volume: 15.5 mL. 2.2 cm septated cyst is identified likely follicular cyst. Left ovary Measurements: 3.7 x 2 x 2 cm = volume: 8.2 mL. Small follicular cysts are noted. Pulsed Doppler evaluation of both ovaries demonstrates normal low-resistance arterial and venous waveforms. Other findings No abnormal free fluid. Incidental finding of mild diffuse thickened bladder wall measuring 6.8 mm in a partial decompressed bladder. IMPRESSION: No evidence of ovarian torsion. Incidental finding of mild diffuse thickened bladder wall measuring 6.8 mm in a partial decompressed bladder. Electronically Signed   By: Abelardo Diesel M.D.   On: 02/21/2018 00:25   US Pelvis Complete  Result Date: 02/21/2018 CLINICAL  DATA:  Right lower quadrant abdomen pain EXAM: TRANSABDOMINAL AND TRANSVAGINAL  ULTRASOUND OF PELVIS DOPPLER ULTRASOUND OF OVARIES TECHNIQUE: Both transabdominal and transvaginal ultrasound examinations of the pelvis were performed. Transabdominal technique was performed for global imaging of the pelvis including uterus, ovaries, adnexal regions, and pelvic cul-de-sac. It was necessary to proceed with endovaginal exam following the transabdominal exam to visualize the ovaries. Color and duplex Doppler ultrasound was utilized to evaluate blood flow to the ovaries. COMPARISON:  None. FINDINGS: Uterus Measurements: 8.9 x 4.6 x 4.6 cm = volume: 101.9 mL. No fibroids or other mass visualized. Endometrium Thickness: 5.3 mm.  No focal abnormality visualized. Right ovary Measurements: 3.9 x 2.7 x 2.7 cm = volume: 15.5 mL. 2.2 cm septated cyst is identified likely follicular cyst. Left ovary Measurements: 3.7 x 2 x 2 cm = volume: 8.2 mL. Small follicular cysts are noted. Pulsed Doppler evaluation of both ovaries demonstrates normal low-resistance arterial and venous waveforms. Other findings No abnormal free fluid. Incidental finding of mild diffuse thickened bladder wall measuring 6.8 mm in a partial decompressed bladder. IMPRESSION: No evidence of ovarian torsion. Incidental finding of mild diffuse thickened bladder wall measuring 6.8 mm in a partial decompressed bladder. Electronically Signed   By: Abelardo Diesel M.D.   On: 02/21/2018 00:25   [place] Art/ven Flow Abd Pelv Doppler  Result Date: 02/21/2018 CLINICAL DATA:  Right lower quadrant abdomen pain EXAM: TRANSABDOMINAL AND TRANSVAGINAL ULTRASOUND OF PELVIS DOPPLER ULTRASOUND OF OVARIES TECHNIQUE: Both transabdominal and transvaginal ultrasound examinations of the pelvis were performed. Transabdominal technique was performed for global imaging of the pelvis including uterus, ovaries, adnexal regions, and pelvic cul-de-sac. It was necessary to proceed with endovaginal exam following the transabdominal exam to visualize the ovaries. Color and duplex  Doppler ultrasound was utilized to evaluate blood flow to the ovaries. COMPARISON:  None. FINDINGS: Uterus Measurements: 8.9 x 4.6 x 4.6 cm = volume: 101.9 mL. No fibroids or other mass visualized. Endometrium Thickness: 5.3 mm.  No focal abnormality visualized. Right ovary Measurements: 3.9 x 2.7 x 2.7 cm = volume: 15.5 mL. 2.2 cm septated cyst is identified likely follicular cyst. Left ovary Measurements: 3.7 x 2 x 2 cm = volume: 8.2 mL. Small follicular cysts are noted. Pulsed Doppler evaluation of both ovaries demonstrates normal low-resistance arterial and venous waveforms. Other findings No abnormal free fluid. Incidental finding of mild diffuse thickened bladder wall measuring 6.8 mm in a partial decompressed bladder. IMPRESSION: No evidence of ovarian torsion. Incidental finding of mild diffuse thickened bladder wall measuring 6.8 mm in a partial decompressed bladder. Electronically Signed   By: Abelardo Diesel M.D.   On: 02/21/2018 00:25    Procedures Procedures (including critical care time)  Medications Ordered in ED Medications - No data to display   Initial Impression / Assessment and Plan / ED Course  I have reviewed the triage vital signs and the nursing notes.  Pertinent labs & imaging results that were available during my care of the patient were reviewed by me and considered in my medical decision making (see chart for details).  Clinical Course as of Feb 21 226  Sat Feb 20, 2018  2312 Will treat for PID.   Wet prep, genital(!) [AM]  2312 Pt also has thickened urinary bladder on ultrasound.  Leukocytes,Ua(!): LARGE [AM]  Sun Feb 21, 2018  0056 Spoke with ED pharmacist Apolonio Schneiders who states that on review of records patient has tolerated multiple second and third generation cephalosporins.  Her reaction is isolated  to Keflex.  Patient is cleared to leave 30 minutes after injectable medications.   [AM]  0223 Patient has not previously had hyponatremia. May be secondary to decreased  oral intake or trazodone. Will have pt recheck in 72 hours.   Sodium(!): 129 [AM]  0224 Pt is nontoxic-appearing does not have any other signs of metabolic acidosis.  No laboratory values suggesting sepsis, or other cause of metabolic acidosis.  CO2(!): 17 [AM]    Clinical Course User Index [AM] Langston Masker B, PA-C    Differential diagnosis includes PID, ovarian torsion, ovarian cyst, cystitis, appendicitis.  Doubtful of appendicitis given pain that is more adnexal and not right lower quadrant.  She is also having pain across the pelvis.  Patient also has pelvic examination consistent with PID.  Work-up showing leukocytosis of 15.9, likely secondary to PID versus UTI.  Urinalysis is with large leukocyte Estrace.  Patient also has urinary thickening on pelvic ultrasound.  Patient has hyponatremia of 129.  Unclear etiology.  She appears asymptomatic of this.  She also has CO2 of 17.  These may be due to poor oral intake, although patient does not appear overtly dehydrated.  I encouraged p.o. hydration and patient.  Wet prep with trichomonas and many WBCs.  Pelvic ultrasound demonstrating follicular appearing cyst.  No torsion or abscess.  Incidental finding of thickened urinary bladder.  Will treat for cystitis and PID.  Patient is instructed to the return to the emergency department should she develop any worsening pain, intractable nausea or vomiting, or fever.  She is also instructed to be rechecked with a BMP in 72 hours patient does not currently have a primary care provider, and did encourage recheck in emergency department versus [facility] in 72 hours.  Case discussed with attending physician Dr. Jola Schmidt who is in agreement with this plan.  Patient treated with Rocephin, doxycycline, and Flagyl for trichomonas.  Patient discharged with doxycycline and Macrobid.  She is instructed to follow-up with OB/GYN.  Patient also instructed to notify partners.  Patient discharged in good  condition all questions answered.  This is a supervised visit with Dr. Jola Schmidt. Evaluation, management, and discharge planning discussed with this attending physician.  Final Clinical Impressions(s) / ED Diagnoses   Final diagnoses:  PID (acute pelvic inflammatory disease)  Acute cystitis without hematuria  Hyponatremia    ED Discharge Orders         Ordered    doxycycline (VIBRAMYCIN) 100 MG capsule  2 times daily     02/21/18 0108    nitrofurantoin, macrocrystal-monohydrate, (MACROBID) 100 MG capsule  2 times daily     02/21/18 0108    ondansetron (ZOFRAN ODT) 4 MG disintegrating tablet  Every 8 hours PRN     02/21/18 0108           Albesa Seen, PA-C 02/21/18 0086    Jola Schmidt, MD 02/21/18 1505

## 2018-02-20 NOTE — ED Triage Notes (Signed)
C/o pelvic pain x 1 week that is worse today.  Reports brown vaginal discharge.  Denies urinary complaints.

## 2018-02-21 DIAGNOSIS — R1031 Right lower quadrant pain: Secondary | ICD-10-CM | POA: Diagnosis not present

## 2018-02-21 LAB — BASIC METABOLIC PANEL
Anion gap: 7 (ref 5–15)
BUN: 17 mg/dL (ref 6–20)
CO2: 17 mmol/L — AB (ref 22–32)
Calcium: 8.7 mg/dL — ABNORMAL LOW (ref 8.9–10.3)
Chloride: 105 mmol/L (ref 98–111)
Creatinine, Ser: 0.81 mg/dL (ref 0.44–1.00)
GFR calc Af Amer: >60 mL/min (ref >60)
GFR calc non Af Amer: >60 mL/min (ref >60)
Glucose, Bld: 88 mg/dL (ref 70–99)
Potassium: 3.5 mmol/L (ref 3.5–5.1)
Sodium: 129 mmol/L — ABNORMAL LOW (ref 135–145)

## 2018-02-21 LAB — CBC WITH DIFFERENTIAL/PLATELET
Abs Immature Granulocytes: 0.06 10*3/uL (ref 0.00–0.07)
Basophils Absolute: 0.1 10*3/uL (ref 0.0–0.1)
Basophils Relative: 0 %
EOS PCT: 4 %
Eosinophils Absolute: 0.7 10*3/uL — ABNORMAL HIGH (ref 0.0–0.5)
HEMATOCRIT: 36.2 % (ref 36.0–46.0)
Hemoglobin: 11.4 g/dL — ABNORMAL LOW (ref 12.0–15.0)
Immature Granulocytes: 0 %
LYMPHS ABS: 2.4 10*3/uL (ref 0.7–4.0)
Lymphocytes Relative: 15 %
MCH: 27.7 pg (ref 26.0–34.0)
MCHC: 31.5 g/dL (ref 30.0–36.0)
MCV: 88.1 fL (ref 80.0–100.0)
Monocytes Absolute: 1 10*3/uL (ref 0.1–1.0)
Monocytes Relative: 7 %
Neutro Abs: 11.7 10*3/uL — ABNORMAL HIGH (ref 1.7–7.7)
Neutrophils Relative %: 74 %
Platelets: 270 10*3/uL (ref 150–400)
RBC: 4.11 MIL/uL (ref 3.87–5.11)
RDW: 14 % (ref 11.5–15.5)
WBC: 15.9 10*3/uL — ABNORMAL HIGH (ref 4.0–10.5)
nRBC: 0 % (ref 0.0–0.2)

## 2018-02-21 LAB — RPR: RPR: NONREACTIVE

## 2018-02-21 LAB — HIV ANTIBODY (ROUTINE TESTING W REFLEX): HIV Screen 4th Generation wRfx: NONREACTIVE

## 2018-02-21 MED ORDER — METRONIDAZOLE 500 MG PO TABS
2000.0000 mg | ORAL_TABLET | Freq: Once | ORAL | Status: AC
  Administered 2018-02-21: 2000 mg via ORAL
  Filled 2018-02-21: qty 4

## 2018-02-21 MED ORDER — DOXYCYCLINE HYCLATE 100 MG PO TABS
100.0000 mg | ORAL_TABLET | Freq: Once | ORAL | Status: AC
  Administered 2018-02-21: 100 mg via ORAL
  Filled 2018-02-21: qty 1

## 2018-02-21 MED ORDER — AZITHROMYCIN 250 MG PO TABS
1000.0000 mg | ORAL_TABLET | Freq: Once | ORAL | Status: AC
  Administered 2018-02-21: 1000 mg via ORAL
  Filled 2018-02-21: qty 4

## 2018-02-21 MED ORDER — DOXYCYCLINE HYCLATE 100 MG PO CAPS: 100.0000 mg | ORAL_CAPSULE | Freq: Two times a day (BID) | ORAL | 0 refills | Status: AC

## 2018-02-21 MED ORDER — ONDANSETRON 4 MG PO TBDP: 4.0000 mg | ORAL_TABLET | Freq: Three times a day (TID) | ORAL | 0 refills | Status: DC | PRN

## 2018-02-21 MED ORDER — NITROFURANTOIN MONOHYD MACRO 100 MG PO CAPS: 100.0000 mg | ORAL_CAPSULE | Freq: Two times a day (BID) | ORAL | 0 refills | Status: DC

## 2018-02-21 MED ORDER — CEFTRIAXONE SODIUM 250 MG IJ SOLR
250.0000 mg | Freq: Once | INTRAMUSCULAR | Status: AC
  Administered 2018-02-21: 250 mg via INTRAMUSCULAR
  Filled 2018-02-21: qty 250

## 2018-02-21 MED ORDER — LIDOCAINE HCL (PF) 1 % IJ SOLN
INTRAMUSCULAR | Status: AC
  Administered 2018-02-21: 0.9 mL
  Filled 2018-02-21: qty 5

## 2018-02-21 MED ORDER — NITROFURANTOIN MONOHYD MACRO 100 MG PO CAPS
100.0000 mg | ORAL_CAPSULE | Freq: Once | ORAL | Status: AC
  Administered 2018-02-21: 100 mg via ORAL
  Filled 2018-02-21: qty 1

## 2018-02-21 MED ORDER — ONDANSETRON 4 MG PO TBDP
4.0000 mg | ORAL_TABLET | Freq: Once | ORAL | Status: AC
  Administered 2018-02-21: 4 mg via ORAL
  Filled 2018-02-21: qty 1

## 2018-02-21 NOTE — Discharge Instructions (Addendum)
You have been treated for gonorrhea, chlamydia, and trichomonas.  You are also being treated for urinary tract infection.  Doxycycline is an antibiotic that fights infection in the the pelvic tract. This medication can make your skin sensitive to the sun, so please ensure that you wear sunscreen, hats, or other coverage over your skin while taking this. This medicine CANNOT be taken by women while pregnant, breastfeeding, or trying to become pregnant.  Please speak with a healthcare provider if any of these situations apply to you.  Please take all of your antibiotics until finished.   You may develop abdominal discomfort or nausea from the antibiotic. If this occurs, you may take it with food. Some patients also get diarrhea with antibiotics. You may help offset this with probiotics which you can buy or get in yogurt. Do not eat or take the probiotics until 2 hours after your antibiotic. Some women develop vaginal yeast infections after antibiotics. If you develop unusual vaginal discharge after being on this medication, please see your primary care provider.   Some people develop allergies to antibiotics. Symptoms of antibiotic allergy can be mild and include a flat rash and itching. They can also be more serious and include:  ?Hives - Hives are raised, red patches of skin that are usually very itchy.  ?Lip or tongue swelling  ?Trouble swallowing or breathing  ?Blistering of the skin or mouth.  If you have any of these serious symptoms, please seek emergency medical care immediately.  Use a condom with every sexual encounter Follow up with your doctor or OBGYN in regards to today's visit.   Please return to the ER for worsening symptoms, high fevers or persistent vomiting.  You have been tested for HIV, syphilis, chlamydia and gonorrhea. These results will be available in approximately 3 days. You will be notified if they are positive.   It is very important to practice safe sex and use  condoms when sexually active. If your results are positive you need to notify all sexual partners so they can be treated as well. The website https://dean.info/ can be used to send anonymous text messages or emails to alert sexual contacts.   SEEK IMMEDIATE MEDICAL CARE IF:  You develop an oral temperature above 102 F (38.9 C), not controlled by medications or lasting more than 2 days.  You develop an increase in pain.  You develop vaginal bleeding and it is not time for your period.  You develop painful intercourse.  Please return the emergency department for any dizziness, lightheadedness, or passing out spells.   You will need follow-up in 3 days either at [facility] urgent care or here in the emergency department to get a recheck of your sodium level and a basic metabolic panel.  Should you develop any symptoms such as dizziness, lightheadedness, passing out, weakness, numbness please return sooner.

## 2018-02-21 NOTE — ED Notes (Signed)
ED Provider at bedside. 

## 2018-02-22 LAB — GC/CHLAMYDIA PROBE AMP (~~LOC~~) NOT AT ARMC
Chlamydia: NEGATIVE
Neisseria Gonorrhea: POSITIVE — AB

## 2018-03-23 ENCOUNTER — Encounter (HOSPITAL_COMMUNITY): Payer: Self-pay | Admitting: Emergency Medicine

## 2018-03-23 ENCOUNTER — Other Ambulatory Visit: Payer: Self-pay

## 2018-03-23 ENCOUNTER — Inpatient Hospital Stay (HOSPITAL_COMMUNITY)
Admission: AD | Admit: 2018-03-23 | Discharge: 2018-03-23 | Payer: BLUE CROSS/BLUE SHIELD | Source: Home / Self Care | Attending: Family Medicine | Admitting: Family Medicine

## 2018-03-23 ENCOUNTER — Emergency Department (HOSPITAL_COMMUNITY)
Admission: EM | Admit: 2018-03-23 | Discharge: 2018-03-23 | Disposition: A | Discharge status: Home / Self Care | Payer: BLUE CROSS/BLUE SHIELD | Attending: Emergency Medicine | Admitting: Emergency Medicine

## 2018-03-23 DIAGNOSIS — Z79899 Other long term (current) drug therapy: Secondary | ICD-10-CM | POA: Insufficient documentation

## 2018-03-23 DIAGNOSIS — B373 Candidiasis of vulva and vagina: Secondary | ICD-10-CM | POA: Insufficient documentation

## 2018-03-23 DIAGNOSIS — N898 Other specified noninflammatory disorders of vagina: Secondary | ICD-10-CM | POA: Diagnosis present

## 2018-03-23 DIAGNOSIS — Z87891 Personal history of nicotine dependence: Secondary | ICD-10-CM | POA: Diagnosis not present

## 2018-03-23 DIAGNOSIS — J45909 Unspecified asthma, uncomplicated: Secondary | ICD-10-CM | POA: Insufficient documentation

## 2018-03-23 DIAGNOSIS — B3731 Acute candidiasis of vulva and vagina: Secondary | ICD-10-CM

## 2018-03-23 LAB — URINALYSIS, ROUTINE W REFLEX MICROSCOPIC
Bilirubin Urine: NEGATIVE
Glucose, UA: NEGATIVE mg/dL
Hgb urine dipstick: NEGATIVE
KETONES UR: NEGATIVE mg/dL
Nitrite: NEGATIVE
Protein, ur: NEGATIVE mg/dL
Specific Gravity, Urine: 1.02 (ref 1.005–1.030)
pH: 6 (ref 5.0–8.0)

## 2018-03-23 LAB — WET PREP, GENITAL
Clue Cells Wet Prep HPF POC: NONE SEEN
Sperm: NONE SEEN
Trich, Wet Prep: NONE SEEN

## 2018-03-23 LAB — POC URINE PREG, ED: Preg Test, Ur: NEGATIVE

## 2018-03-23 MED ORDER — FLUCONAZOLE 150 MG PO TABS
150.0000 mg | ORAL_TABLET | Freq: Once | ORAL | Status: AC
  Administered 2018-03-23: 150 mg via ORAL
  Filled 2018-03-23: qty 1

## 2018-03-23 MED ORDER — FLUCONAZOLE 150 MG PO TABS: 150.0000 mg | ORAL_TABLET | Freq: Once | ORAL | 0 refills | Status: AC

## 2018-03-23 NOTE — ED Notes (Signed)
Patient verbalizes understanding of discharge instructions. Opportunity for questioning and answers were provided. Armband removed by staff, pt discharged from ED ambulatory to home.  

## 2018-03-23 NOTE — Discharge Instructions (Signed)
Follow-up with your family doctor or OB/GYN.  I have written your prescription for another dose of the antifungal medication in case her symptoms persist.

## 2018-03-23 NOTE — ED Triage Notes (Signed)
Pt complains of vaginal irritation and itching since yesterday. Pt states she has milky discharge. Pt has taken over the counter monistat but hasnt helped.

## 2018-03-23 NOTE — ED Provider Notes (Signed)
[facility] EMERGENCY DEPARTMENT Provider Note   CSN: [id number] Arrival date & time: 03/23/18  2008    History   Chief Complaint No chief complaint on file.   HPI Kathy Flores is a 28 y.o. female.     28 yo F with a chief complaint of vaginal itching.  This been going on for about a week.  She tried Monistat without improvement.  Feels that she has been having whitish discharge as well.  Denies abdominal pain denies pelvic pain or vaginal bleeding.  Denies likelihood of being pregnant.  Sexually active not on birth control.  Last menstrual cycle was a month ago.  Denies dysuria increased frequency or hesitancy denies flank pain.  The history is provided by the patient.  Illness  Severity:  Mild Onset quality:  Gradual Duration:  1 week Timing:  Constant Progression:  Worsening Chronicity:  New Associated symptoms: no chest pain, no congestion, no fever, no headaches, no myalgias, no nausea, no rhinorrhea, no shortness of breath, no vomiting and no wheezing     Past Medical History:  Diagnosis Date  . Asthma    inhaler used 3x month  . Depression   . Fibroid   . Trichomonas infection   . Vaginal Pap smear, abnormal     Patient Active Problem List   Diagnosis Date Noted  . Asthma exacerbation   . Metabolic acidosis   . Acute respiratory failure with hypoxemia ([place]) 09/23/2015  . SIRS (systemic inflammatory response syndrome) ([place]) 09/23/2015  . Normochromic normocytic anemia 09/23/2015  . Lactic acidosis   . Encounter for observation of infant for suspected infection   . Post term pregnancy, 41 weeks 11/22/2013  . Post-term pregnancy, 40-42 weeks of gestation   . [redacted] weeks gestation of pregnancy     Past Surgical History:  Procedure Laterality Date  . CESAREAN SECTION N/A 11/23/2013   Procedure: CESAREAN SECTION;  Surgeon: Woodroe Mode, MD;  Location: [facility];  Service: Obstetrics;  Laterality: N/A;     OB History    Gravida  3   Para  3   Term  3   Preterm      AB      Living  3     SAB      TAB      Ectopic      Multiple  0   Live Births  3            Home Medications    Prior to Admission medications   Medication Sig Start Date End Date Taking? Authorizing Provider  albuterol (PROVENTIL HFA;VENTOLIN HFA) 108 (90 Base) MCG/ACT inhaler Inhale 2 puffs into the lungs every 6 (six) hours as needed for wheezing or shortness of breath.   Yes [provider]  albuterol (PROVENTIL) (2.5 MG/3ML) 0.083% nebulizer solution Take 2.5 mg by nebulization every 6 (six) hours as needed for wheezing or shortness of breath.   Yes [provider]  ibuprofen (ADVIL,MOTRIN) 200 MG tablet Take 800 mg by mouth every 6 (six) hours as needed (pain).   Yes [provider]  Melatonin 5 MG TABS Take 5 mg by mouth at bedtime.   Yes [provider]  ondansetron (ZOFRAN ODT) 4 MG disintegrating tablet Take 1 tablet (4 mg total) by mouth every 8 (eight) hours as needed for nausea or vomiting. 02/21/18  Yes Valere Dross, Alyssa B, PA-C  traZODone (DESYREL) 50 MG tablet Take 50 mg by mouth at bedtime.  Yes [provider]  benzonatate (TESSALON) 100 MG capsule Take 1 capsule (100 mg total) by mouth every 8 (eight) hours. Patient not taking: Reported on 02/20/2018 08/10/17   Hedges, Dellis Filbert, PA-C  fluconazole (DIFLUCAN) 150 MG tablet Take 1 tablet (150 mg total) by mouth once for 1 dose. In one week if your symptoms have persisted 03/23/18 03/23/18  Deno Etienne, DO  loperamide (IMODIUM) 2 MG capsule Take 1 capsule (2 mg total) by mouth 4 (four) times daily as needed for diarrhea or loose stools. Patient not taking: Reported on 02/20/2018 08/04/17   Hedges, Dellis Filbert, PA-C  nitrofurantoin, macrocrystal-monohydrate, (MACROBID) 100 MG capsule Take 1 capsule (100 mg total) by mouth 2 (two) times daily. Patient not taking: Reported on 03/23/2018 02/21/18   Albesa Seen, PA-C    Family History Family  History  Problem Relation Age of Onset  . Asthma Sister   . Healthy Mother   . Healthy Father     Social History Social History   Tobacco Use  . Smoking status: Former Research scientist (life sciences)  . Smokeless tobacco: Never Used  Substance Use Topics  . Alcohol use: No  . Drug use: No     Allergies   Keflet [cephalexin] and Penicillins   Review of Systems Review of Systems  Constitutional: Negative for chills and fever.  HENT: Negative for congestion and rhinorrhea.   Eyes: Negative for redness and visual disturbance.  Respiratory: Negative for shortness of breath and wheezing.   Cardiovascular: Negative for chest pain and palpitations.  Gastrointestinal: Negative for nausea and vomiting.  Genitourinary: Positive for vaginal discharge and vaginal pain. Negative for dysuria and urgency.  Musculoskeletal: Negative for arthralgias and myalgias.  Skin: Negative for pallor and wound.  Neurological: Negative for dizziness and headaches.     Physical Exam Updated Vital Signs BP 137/84 (BP Location: Right Arm)   Pulse 82   Temp 98.4 F (36.9 C) (Oral)   Resp 16   Ht 5\' 3"  (1.6 m)   Wt 93.9 kg   SpO2 100%   BMI 36.67 kg/m   Physical Exam Vitals signs and nursing note reviewed. Exam conducted with a chaperone present.  Constitutional:      General: She is not in acute distress.    Appearance: She is well-developed. She is obese. She is not diaphoretic.  HENT:     Head: Normocephalic and atraumatic.  Eyes:     Pupils: Pupils are equal, round, and reactive to light.  Neck:     Musculoskeletal: Normal range of motion and neck supple.  Cardiovascular:     Rate and Rhythm: Normal rate and regular rhythm.     Heart sounds: No murmur. No friction rub. No gallop.   Pulmonary:     Effort: Pulmonary effort is normal.     Breath sounds: No wheezing or rales.  Abdominal:     General: There is no distension.     Palpations: Abdomen is soft.     Tenderness: There is no abdominal tenderness.   Genitourinary:    Vagina: Lesions present.     Cervix: No cervical motion tenderness or discharge.     Uterus: Normal. Not enlarged and not tender.      Adnexa: Right adnexa normal and left adnexa normal.       Right: No mass, tenderness or fullness.         Left: No mass, tenderness or fullness.       Comments: Yeasty smelling, diffuse white plaques Musculoskeletal:  General: No tenderness.  Skin:    General: Skin is warm and dry.  Neurological:     Mental Status: She is alert and oriented to person, place, and time.  Psychiatric:        Behavior: Behavior normal.      ED Treatments / Results  Labs (all labs ordered are listed, but only abnormal results are displayed) Labs Reviewed  WET PREP, GENITAL - Abnormal; Notable for the following components:      Result Value   Yeast Wet Prep HPF POC PRESENT (*)    WBC, Wet Prep HPF POC MANY (*)    All other components within normal limits  URINALYSIS, ROUTINE W REFLEX MICROSCOPIC - Abnormal; Notable for the following components:   APPearance CLOUDY (*)    Leukocytes,Ua LARGE (*)    Bacteria, UA RARE (*)    All other components within normal limits  POC URINE PREG, ED  GC/CHLAMYDIA PROBE AMP ([place]) NOT AT [facility]    EKG None  Radiology No results found.  Procedures Procedures (including critical care time)  Medications Ordered in ED Medications  fluconazole (DIFLUCAN) tablet 150 mg (150 mg Oral Given 03/23/18 2225)     Initial Impression / Assessment and Plan / ED Course  I have reviewed the triage vital signs and the nursing notes.  Pertinent labs & imaging results that were available during my care of the patient were reviewed by me and considered in my medical decision making (see chart for details).        28 yo F with a chief complaint of an itchy vulva and discharge.  Going on for the past week.  She tried a over-the-counter antifungal medication and has had worsening.  Give a dose of Diflucan  here.  Prescription for a dose to take in a week if still having symptoms.  PCP or GYN follow-up.  UA is negative for infection, is contaminated.  Patient is not pregnant.  She has yeast on wet prep.  There is no glucose in her urine which makes hyperglycemia the cause of this unlikely.  10:33 PM:  I have discussed the diagnosis/risks/treatment options with the patient and family and believe the pt to be eligible for discharge home to follow-up with PCP, gyn. We also discussed returning to the ED immediately if new or worsening sx occur. We discussed the sx which are most concerning (e.g., sudden worsening pain, fever, inability to tolerate by mouth) that necessitate immediate return. Medications administered to the patient during their visit and any new prescriptions provided to the patient are listed below.  Medications given during this visit Medications  fluconazole (DIFLUCAN) tablet 150 mg (150 mg Oral Given 03/23/18 2225)     The patient appears reasonably screen and/or stabilized for discharge and I doubt any other medical condition or other [facility] requiring further screening, evaluation, or treatment in the ED at this time prior to discharge.    Final Clinical Impressions(s) / ED Diagnoses   Final diagnoses:  Yeast infection of the vagina    ED Discharge Orders         Ordered    fluconazole (DIFLUCAN) 150 MG tablet   Once     03/23/18 2231           Deno Etienne, DO 03/23/18 2233

## 2018-03-24 DIAGNOSIS — E669 Obesity, unspecified: Secondary | ICD-10-CM | POA: Diagnosis not present

## 2018-03-24 DIAGNOSIS — F3162 Bipolar disorder, current episode mixed, moderate: Secondary | ICD-10-CM | POA: Diagnosis not present

## 2018-03-24 DIAGNOSIS — J45909 Unspecified asthma, uncomplicated: Secondary | ICD-10-CM | POA: Diagnosis not present

## 2018-03-24 DIAGNOSIS — F17218 Nicotine dependence, cigarettes, with other nicotine-induced disorders: Secondary | ICD-10-CM | POA: Diagnosis not present

## 2018-03-25 LAB — GC/CHLAMYDIA PROBE AMP (~~LOC~~) NOT AT ARMC
Chlamydia: NEGATIVE
Neisseria Gonorrhea: NEGATIVE

## 2018-04-23 ENCOUNTER — Emergency Department (HOSPITAL_COMMUNITY)
Admission: EM | Admit: 2018-04-23 | Discharge: 2018-04-23 | Disposition: A | Discharge status: Home / Self Care | Payer: Worker's Compensation | Attending: Emergency Medicine | Admitting: Emergency Medicine

## 2018-04-23 ENCOUNTER — Other Ambulatory Visit: Payer: Self-pay

## 2018-04-23 ENCOUNTER — Emergency Department (HOSPITAL_COMMUNITY): Payer: Worker's Compensation

## 2018-04-23 ENCOUNTER — Encounter (HOSPITAL_COMMUNITY): Payer: Self-pay | Admitting: Emergency Medicine

## 2018-04-23 DIAGNOSIS — I10 Essential (primary) hypertension: Secondary | ICD-10-CM | POA: Diagnosis not present

## 2018-04-23 DIAGNOSIS — S61411A Laceration without foreign body of right hand, initial encounter: Secondary | ICD-10-CM | POA: Diagnosis not present

## 2018-04-23 DIAGNOSIS — W272XXA Contact with scissors, initial encounter: Secondary | ICD-10-CM | POA: Insufficient documentation

## 2018-04-23 DIAGNOSIS — Y990 Civilian activity done for income or pay: Secondary | ICD-10-CM | POA: Insufficient documentation

## 2018-04-23 DIAGNOSIS — Z87891 Personal history of nicotine dependence: Secondary | ICD-10-CM | POA: Diagnosis not present

## 2018-04-23 DIAGNOSIS — Y9259 Other trade areas as the place of occurrence of the external cause: Secondary | ICD-10-CM | POA: Insufficient documentation

## 2018-04-23 DIAGNOSIS — Z79899 Other long term (current) drug therapy: Secondary | ICD-10-CM | POA: Insufficient documentation

## 2018-04-23 DIAGNOSIS — J45909 Unspecified asthma, uncomplicated: Secondary | ICD-10-CM | POA: Diagnosis not present

## 2018-04-23 DIAGNOSIS — S61210A Laceration without foreign body of right index finger without damage to nail, initial encounter: Secondary | ICD-10-CM | POA: Diagnosis not present

## 2018-04-23 DIAGNOSIS — Y9389 Activity, other specified: Secondary | ICD-10-CM | POA: Insufficient documentation

## 2018-04-23 LAB — POCT PREGNANCY, URINE: Preg Test, Ur: NEGATIVE

## 2018-04-23 MED ORDER — LIDOCAINE-EPINEPHRINE-TETRACAINE (LET) SOLUTION
3.0000 mL | Freq: Once | NASAL | Status: AC
  Administered 2018-04-23: 3 mL via TOPICAL
  Filled 2018-04-23: qty 3

## 2018-04-23 NOTE — ED Triage Notes (Signed)
Per [facility] pt work where hand got cut with a pair of scissors on right hand. Bleeding is controlled by bandage at this time.

## 2018-04-23 NOTE — ED Provider Notes (Signed)
[facility] DEPT Provider Note   CSN: [id number] Arrival date & time: 04/23/18  1434    History   Chief Complaint Chief Complaint  Patient presents with  . Extremity Laceration    right hand    HPI Kathy Flores is a 28 y.o. female.     HPI   Patient is a 28 year old female with past medical history of asthma, depression, PID presenting for laceration to dorsum of right hand.  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  Patient denies any weakness or numbness of the right hand.  Reports bleeding is hemostatic with compression on arrival.  Last tetanus shot 1.5 years ago.  Past Medical History:  Diagnosis Date  . Asthma    inhaler used 3x month  . Depression   . Fibroid   . Trichomonas infection   . Vaginal Pap smear, abnormal     Patient Active Problem List   Diagnosis Date Noted  . Asthma exacerbation   . Metabolic acidosis   . Acute respiratory failure with hypoxemia ([place]) 09/23/2015  . SIRS (systemic inflammatory response syndrome) ([place]) 09/23/2015  . Normochromic normocytic anemia 09/23/2015  . Lactic acidosis   . Encounter for observation of infant for suspected infection   . Post term pregnancy, 41 weeks 11/22/2013  . Post-term pregnancy, 40-42 weeks of gestation   . [redacted] weeks gestation of pregnancy     Past Surgical History:  Procedure Laterality Date  . CESAREAN SECTION N/A 11/23/2013   Procedure: CESAREAN SECTION;  Surgeon: Woodroe Mode, MD;  Location: [facility];  Service: Obstetrics;  Laterality: N/A;     OB History    Gravida  3   Para  3   Term  3   Preterm      AB      Living  3     SAB      TAB      Ectopic      Multiple  0   Live Births  3            Home Medications    Prior to Admission medications   Medication Sig Start Date End Date  Taking? Authorizing Provider  albuterol (PROVENTIL HFA;VENTOLIN HFA) 108 (90 Base) MCG/ACT inhaler Inhale 2 puffs into the lungs every 6 (six) hours as needed for wheezing or shortness of breath.    [provider]  albuterol (PROVENTIL) (2.5 MG/3ML) 0.083% nebulizer solution Take 2.5 mg by nebulization every 6 (six) hours as needed for wheezing or shortness of breath.    [provider]  benzonatate (TESSALON) 100 MG capsule Take 1 capsule (100 mg total) by mouth every 8 (eight) hours. Patient not taking: Reported on 02/20/2018 08/10/17   Hedges, Dellis Filbert, PA-C  ibuprofen (ADVIL,MOTRIN) 200 MG tablet Take 800 mg by mouth every 6 (six) hours as needed (pain).    [provider]  loperamide (IMODIUM) 2 MG capsule Take 1 capsule (2 mg total) by mouth 4 (four) times daily as needed for diarrhea or loose stools. Patient not taking: Reported on 02/20/2018 08/04/17   Okey Regal, PA-C  Melatonin 5 MG TABS Take 5 mg by mouth at bedtime.    [provider]  nitrofurantoin, macrocrystal-monohydrate, (MACROBID) 100 MG capsule Take 1 capsule (100 mg total) by mouth 2 (two) times daily. Patient  not taking: Reported on 03/23/2018 02/21/18   Langston Masker B, PA-C  ondansetron (ZOFRAN ODT) 4 MG disintegrating tablet Take 1 tablet (4 mg total) by mouth every 8 (eight) hours as needed for nausea or vomiting. 02/21/18   Langston Masker B, PA-C  traZODone (DESYREL) 50 MG tablet Take 50 mg by mouth at bedtime.     [provider]    Family History Family History  Problem Relation Age of Onset  . Asthma Sister   . Healthy Mother   . Healthy Father     Social History Social History   Tobacco Use  . Smoking status: Former Research scientist (life sciences)  . Smokeless tobacco: Never Used  Substance Use Topics  . Alcohol use: No  . Drug use: No     Allergies   Keflet [cephalexin] and Penicillins   Review of Systems Review of Systems  Musculoskeletal: Negative for arthralgias and  myalgias.  Skin: Positive for wound. Negative for color change.  Neurological: Negative for weakness and numbness.     Physical Exam Updated Vital Signs BP 113/70 (BP Location: Left Arm)   Pulse 75   Temp 98.8 F (37.1 C) (Oral)   Resp 14   SpO2 100%   Physical Exam Vitals signs and nursing note reviewed.  Constitutional:      General: She is not in acute distress.    Appearance: She is well-developed. She is not diaphoretic.     Comments: Sitting comfortably in bed.  HENT:     Head: Normocephalic and atraumatic.  Eyes:     General:        Right eye: No discharge.        Left eye: No discharge.     Conjunctiva/sclera: Conjunctivae normal.     Comments: EOMs normal to gross examination.  Neck:     Musculoskeletal: Normal range of motion.  Cardiovascular:     Rate and Rhythm: Normal rate and regular rhythm.     Comments: Intact, 2+ radial pulse on right.  Pulmonary:     Comments: Patient converses comfortably without audible wheeze or stridor. Abdominal:     General: There is no distension.  Musculoskeletal: Normal range of motion.  Skin:    General: Skin is warm and dry.     Comments: There is a 3 cm superficial laceration to the dorsum of the right hand over the anatomic snuffbox.  There is also a very superficial laceration on the radial aspect of the dorsum of the right hand proximal to the laceration overlying anatomic snuffbox. Full sensation distally.  Neurological:     Mental Status: She is alert.     Comments: Cranial nerves intact to gross observation. Patient moves extremities without difficulty.  Psychiatric:        Behavior: Behavior normal.        Thought Content: Thought content normal.        Judgment: Judgment normal.      ED Treatments / Results  Labs (all labs ordered are listed, but only abnormal results are displayed) Labs Reviewed  POC URINE PREG, ED  POCT PREGNANCY, URINE    EKG None  Radiology Dg Hand Complete Right  Result  Date: 04/23/2018 CLINICAL DATA:  Injured by scissors EXAM: RIGHT HAND - COMPLETE 3+ VIEW COMPARISON:  None. FINDINGS: Frontal, oblique, and lateral views obtained. No appreciable fracture or dislocation. Joint spaces appear normal. No erosive change. No radiopaque foreign body. Air consistent with laceration at the level of the second MCP joint noted.  IMPRESSION: Apparent soft tissue laceration at the level of the second MCP joint. No radiopaque foreign body. No fracture or dislocation. No apparent arthropathy. Electronically Signed   By: Lowella Grip III M.D.   On: 04/23/2018 15:25    Procedures .Marland KitchenLaceration Repair Date/Time: 04/23/2018 6:32 PM Performed by: Albesa Seen, PA-C Authorized by: Albesa Seen, PA-C   Consent:    Consent obtained:  Verbal   Consent given by:  Patient   Risks discussed:  Infection and pain Anesthesia (see MAR for exact dosages):    Anesthesia method:  Topical application   Topical anesthetic:  LET Laceration details:    Location:  Hand   Hand location:  R hand, dorsum   Length (cm):  3 Repair type:    Repair type:  Simple Pre-procedure details:    Preparation:  Patient was prepped and draped in usual sterile fashion and imaging obtained to evaluate for foreign bodies Exploration:    Hemostasis achieved with:  LET   Wound exploration: wound explored through full range of motion     Contaminated: no   Treatment:    Area cleansed with:  Betadine and saline   Amount of cleaning:  Standard Skin repair:    Repair method:  Tissue adhesive Approximation:    Approximation:  Close Post-procedure details:    Dressing:  Non-adherent dressing   Patient tolerance of procedure:  Tolerated well, no immediate complications   (including critical care time)  Medications Ordered in ED Medications  lidocaine-EPINEPHrine-tetracaine (LET) solution (3 mLs Topical Given 04/23/18 1611)     Initial Impression / Assessment and Plan / ED Course  I have  reviewed the triage vital signs and the nursing notes.  Pertinent labs & imaging results that were available during my care of the patient were reviewed by me and considered in my medical decision making (see chart for details).        Patient well-appearing neurovascular intact in the right hand.  Patient with a superficial laceration overlying the anatomic snuffbox.  There is no deeper tissue damage.  Amenable to closure with tissue adhesive.  Patient tolerated well.  Tetanus shot is up-to-date.  Patient instructed on typical wound care and course of tissue adhesive to fall healing.  Return precautions given for any erythema or swelling.  Patient is in understanding and agrees with plan of care.  Final Clinical Impressions(s) / ED Diagnoses   Final diagnoses:  Laceration of right hand without foreign body, initial encounter    ED Discharge Orders    None       Tamala Julian 04/23/18 Velta Addison    Sherwood Gambler, MD 04/23/18 1859

## 2018-04-23 NOTE — Discharge Instructions (Addendum)
Please see the information and instructions below regarding your visit.  Your diagnoses today include:  1. Laceration of right hand without foreign body, initial encounter   .  Tests performed today include: X-ray of the affected area that did not show any foreign bodies or broken bones Vital signs. See below for your results today.   Medications prescribed:   Take any prescribed medications only as directed.  Ibuprofen alternating with Tylenol for pain.   Home care instructions:  Follow any educational materials and wound care instructions contained in this packet.   Keep affected area above the level of your heart when possible to minimize swelling. Wash area gently twice a day with warm soapy water. Do not apply alcohol or hydrogen peroxide directly over a wound. Cover the area if it is draining or weeping. Keep the bandage in place for 24 hours and refrain from getting the wound wet for 24 hours. After that, you may get the area wet, but please ensure that you dry it completely afterwards.  Please refrain from soaking the wound for long periods of time, or swimming in chlorinated water   You may apply antibiotic ointment such as Bacitracin or Neosporin.  Follow-up instructions:  Return instructions:  Return to the Emergency Department if you have: Fever Worsening pain Worsening swelling of the wound Pus draining from the wound Redness of the skin that moves away from the wound, especially if it streaks away from the affected area  Any other emergent concerns  Your vital signs today were: BP 113/70 (BP Location: Left Arm)    Pulse 75    Temp 98.8 F (37.1 C) (Oral)    Resp 14    SpO2 100%  If your blood pressure (BP) was elevated on multiple readings during this visit above 130 for the top number or above 80 for the bottom number, please have this repeated by your primary care provider within one month. --------------  Thank you for allowing [place] to participate in your care  today! It was a pleasure taking care of you.

## 2018-05-24 IMAGING — DX DG CHEST 2V
2 series · 2 of 2 positions shown · non-contrast
Comparison: November 04, 2010

CLINICAL DATA: Chest pain and cough

EXAM:
CHEST  2 VIEW

[chest pa]
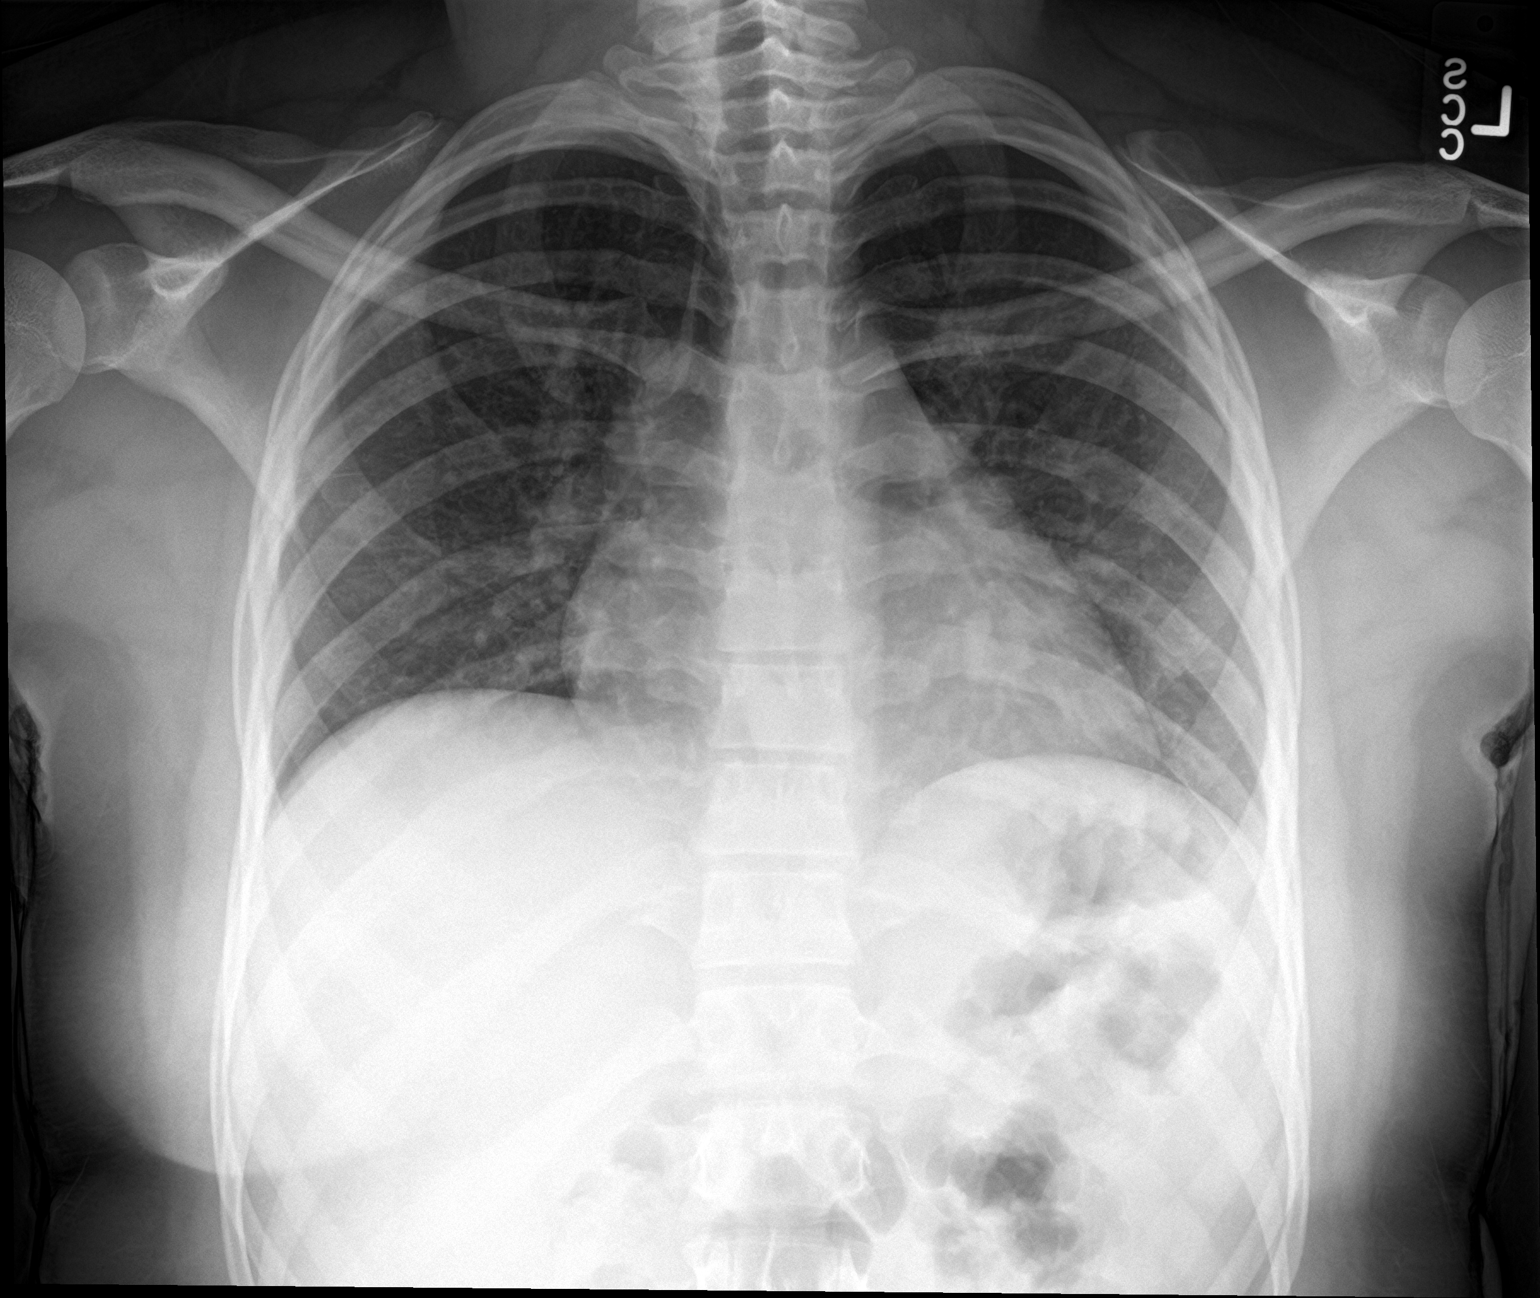

[chest lat]
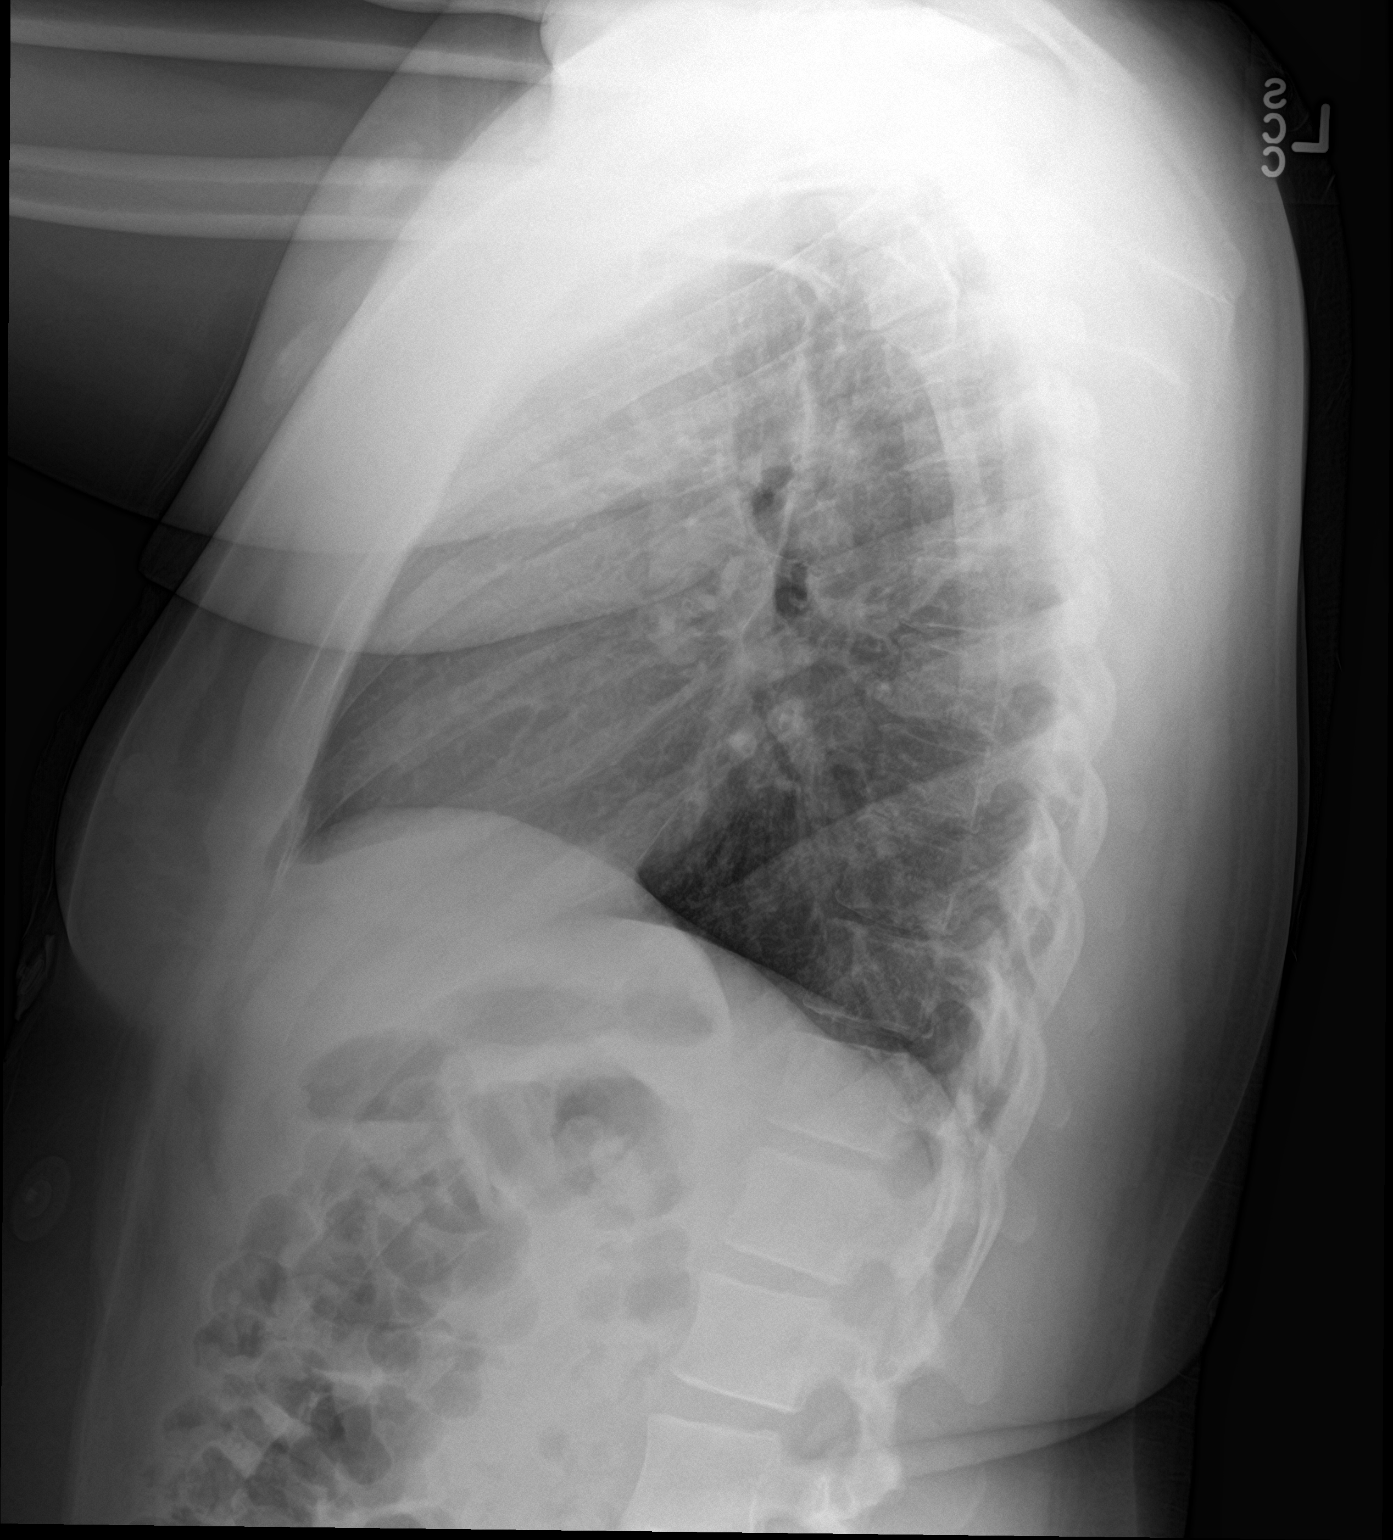

[2 of 2 positions shown; findings below may reference images not displayed]

FINDINGS: There is no edema or consolidation. The heart size and pulmonary
vascularity are normal. No adenopathy. No pneumothorax. No bone
lesions.
IMPRESSION: No edema or consolidation.

## 2018-05-26 ENCOUNTER — Other Ambulatory Visit: Payer: Self-pay

## 2018-05-26 ENCOUNTER — Emergency Department (HOSPITAL_COMMUNITY)
Admission: EM | Admit: 2018-05-26 | Discharge: 2018-05-26 | Disposition: A | Discharge status: Home / Self Care | Payer: BLUE CROSS/BLUE SHIELD | Attending: Emergency Medicine | Admitting: Emergency Medicine

## 2018-05-26 ENCOUNTER — Encounter (HOSPITAL_COMMUNITY): Payer: Self-pay | Admitting: Emergency Medicine

## 2018-05-26 DIAGNOSIS — N760 Acute vaginitis: Secondary | ICD-10-CM | POA: Diagnosis not present

## 2018-05-26 DIAGNOSIS — B9689 Other specified bacterial agents as the cause of diseases classified elsewhere: Secondary | ICD-10-CM | POA: Diagnosis not present

## 2018-05-26 DIAGNOSIS — N898 Other specified noninflammatory disorders of vagina: Secondary | ICD-10-CM | POA: Diagnosis present

## 2018-05-26 DIAGNOSIS — B3731 Acute candidiasis of vulva and vagina: Secondary | ICD-10-CM

## 2018-05-26 DIAGNOSIS — B373 Candidiasis of vulva and vagina: Secondary | ICD-10-CM | POA: Diagnosis not present

## 2018-05-26 LAB — URINALYSIS, ROUTINE W REFLEX MICROSCOPIC
Bilirubin Urine: NEGATIVE
Glucose, UA: NEGATIVE mg/dL
Ketones, ur: NEGATIVE mg/dL
Nitrite: NEGATIVE
Protein, ur: NEGATIVE mg/dL
Specific Gravity, Urine: 1.006 (ref 1.005–1.030)
pH: 6 (ref 5.0–8.0)

## 2018-05-26 LAB — WET PREP, GENITAL
Sperm: NONE SEEN
Trich, Wet Prep: NONE SEEN

## 2018-05-26 LAB — CBG MONITORING, ED: Glucose-Capillary: 71 mg/dL (ref 70–99)

## 2018-05-26 LAB — I-STAT BETA HCG BLOOD, ED (MC, WL, AP ONLY): I-stat hCG, quantitative: <5 m[IU]/mL (ref <5)

## 2018-05-26 MED ORDER — FLUCONAZOLE 150 MG PO TABS
150.0000 mg | ORAL_TABLET | Freq: Once | ORAL | Status: AC
  Administered 2018-05-26: 150 mg via ORAL
  Filled 2018-05-26: qty 1

## 2018-05-26 MED ORDER — METRONIDAZOLE 500 MG PO TABS: 500.0000 mg | ORAL_TABLET | Freq: Two times a day (BID) | ORAL | 0 refills | Status: DC

## 2018-05-26 NOTE — Discharge Instructions (Signed)
Take all Flagyl as prescribed.  Follow-up with an OB/GYN as soon as possible for continued evaluation of your recurrent yeast infections.  Return to the ED immediately for new or worsening symptoms or concerns, such as abdominal pain, vomiting, fevers or any concerns at all.

## 2018-05-26 NOTE — ED Provider Notes (Signed)
[facility] EMERGENCY DEPARTMENT Provider Note   CSN: [id number] Arrival date & time: 05/26/18  1750    History   Chief Complaint Chief Complaint  Patient presents with  . Vaginal Discharge    HPI Kathy Flores is a 28 y.o. female. 28 year old female, no significant medical history, presents with vaginal irritation.  She notes vaginal discharge of a white or clear discharge that is malodorous.  She states she has had a yeast infection every month for the last several months.  She also notes for the last several months she has had urinary frequency.  She states she has tried over-the-counter medication including miconazole for her yeast infection.  She denies diabetes but states she has not been tested in quite some time.  She denies any dysuria, abdominal pain, vaginal bleeding.  She denies any fevers, chills.     Vaginal Discharge  Associated symptoms: no abdominal pain, no dysuria, no fever, no nausea and no vomiting     Past Medical History:  Diagnosis Date  . Asthma    inhaler used 3x month  . Depression   . Fibroid   . Trichomonas infection   . Vaginal Pap smear, abnormal     Patient Active Problem List   Diagnosis Date Noted  . Asthma exacerbation   . Metabolic acidosis   . Acute respiratory failure with hypoxemia ([place]) 09/23/2015  . SIRS (systemic inflammatory response syndrome) ([place]) 09/23/2015  . Normochromic normocytic anemia 09/23/2015  . Lactic acidosis   . Encounter for observation of infant for suspected infection   . Post term pregnancy, 41 weeks 11/22/2013  . Post-term pregnancy, 40-42 weeks of gestation   . [redacted] weeks gestation of pregnancy     Past Surgical History:  Procedure Laterality Date  . CESAREAN SECTION N/A 11/23/2013   Procedure: CESAREAN SECTION;  Surgeon: Woodroe Mode, MD;  Location: [facility];  Service: Obstetrics;  Laterality: N/A;     OB History    Gravida  3   Para  3   Term  3   Preterm      AB       Living  3     SAB      TAB      Ectopic      Multiple  0   Live Births  3            Home Medications    Prior to Admission medications   Medication Sig Start Date End Date Taking? Authorizing Provider  ibuprofen (ADVIL,MOTRIN) 200 MG tablet Take 800 mg by mouth every 6 (six) hours as needed (pain).   Yes [provider]  lurasidone (LATUDA) 20 MG TABS tablet Take 20 mg by mouth daily. 03/24/18  Yes [provider]  traZODone (DESYREL) 50 MG tablet Take 50 mg by mouth at bedtime.    Yes [provider]    Family History Family History  Problem Relation Age of Onset  . Asthma Sister   . Healthy Mother   . Healthy Father     Social History Social History   Tobacco Use  . Smoking status: Former Research scientist (life sciences)  . Smokeless tobacco: Never Used  Substance Use Topics  . Alcohol use: No  . Drug use: No     Allergies   Keflet [cephalexin] and Penicillins   Review of Systems Review of Systems  Constitutional: Negative for chills and fever.  Respiratory: Negative for shortness of breath.   Cardiovascular: Negative for  chest pain.  Gastrointestinal: Negative for abdominal pain, nausea and vomiting.  Genitourinary: Positive for frequency and vaginal discharge. Negative for dysuria, urgency and vaginal bleeding.     Physical Exam Updated Vital Signs BP 134/67 (BP Location: Right Arm)   Pulse 85   Temp 98.4 F (36.9 C) (Oral)   Resp 16   Ht 5\' 3"  (1.6 m)   Wt 90.7 kg   LMP 05/07/2018 (Exact Date)   SpO2 99%   BMI 35.43 kg/m   Physical Exam Vitals signs and nursing note reviewed. Exam conducted with a chaperone present.  Constitutional:      Appearance: She is well-developed.  HENT:     Head: Normocephalic and atraumatic.  Eyes:     Conjunctiva/sclera: Conjunctivae normal.  Neck:     Musculoskeletal: Neck supple.  Cardiovascular:     Rate and Rhythm: Normal rate and regular rhythm.     Heart sounds: Normal heart sounds. No  murmur.  Pulmonary:     Effort: Pulmonary effort is normal. No respiratory distress.     Breath sounds: Normal breath sounds. No wheezing or rales.  Abdominal:     General: Bowel sounds are normal. There is no distension.     Palpations: Abdomen is soft.     Tenderness: There is no abdominal tenderness.  Genitourinary:    Exam position: Supine.     Vagina: Vaginal discharge (white clumps) present.     Cervix: Normal.     Uterus: Normal. Not tender.      Adnexa: Right adnexa normal and left adnexa normal.       Right: No tenderness.         Left: No tenderness.    Musculoskeletal: Normal range of motion.        General: No tenderness or deformity.  Skin:    General: Skin is warm and dry.     Findings: No erythema or rash.  Neurological:     Mental Status: She is alert and oriented to person, place, and time.  Psychiatric:        Behavior: Behavior normal.      ED Treatments / Results  Labs (all labs ordered are listed, but only abnormal results are displayed) Labs Reviewed  WET PREP, GENITAL - Abnormal; Notable for the following components:      Result Value   Yeast Wet Prep HPF POC PRESENT (*)    Clue Cells Wet Prep HPF POC PRESENT (*)    WBC, Wet Prep HPF POC MODERATE (*)    All other components within normal limits  URINALYSIS, ROUTINE W REFLEX MICROSCOPIC - Abnormal; Notable for the following components:   Color, Urine STRAW (*)    APPearance HAZY (*)    Hgb urine dipstick SMALL (*)    Leukocytes,Ua LARGE (*)    Bacteria, UA RARE (*)    All other components within normal limits  I-STAT BETA HCG BLOOD, ED ([facility], [facility], [facility] ONLY)  CBG MONITORING, ED  GC/CHLAMYDIA PROBE AMP ([place]) NOT AT [facility]    EKG None  Radiology No results found.  Procedures Procedures (including critical care time)  Medications Ordered in ED Medications  fluconazole (DIFLUCAN) tablet 150 mg (150 mg Oral Given 05/26/18 2029)     Initial Impression / Assessment and Plan / ED Course   I have reviewed the triage vital signs and the nursing notes.  Pertinent labs & imaging results that were available during my care of the patient were reviewed by me and  considered in my medical decision making (see chart for details).        Presents with vaginal irritation.  She notes discharge and a foul odor.  She denies any abdominal pain.  Abdomen is soft and nontender to palpation.  Her vital signs are unremarkable.  On physical exam she did have discharge consistent with yeast.  She was given fluconazole for this.  Swab confirmed that she had vaginal yeast and also noted clue cells consistent with bacterial vaginosis.  Gonorrhea and chlamydia swab is pending.  Patient noted urinary frequency however urine does not appear to be infected, will send for culture.  Given urinary frequency and recurrent vaginal yeast there is concern for diabetes so a spot glucose was checked.  Her glucose is normal, 71, making low suspicion for diabetes.  Patient encouraged to follow-up with OB/GYN regarding her recurrent yeast infections.  She is agreeable with plan.  She is ready and stable for discharge.  Final Clinical Impressions(s) / ED Diagnoses   Final diagnoses:  None    ED Discharge Orders    None       Rachel Moulds 05/26/18 2221    Charlesetta Shanks, MD 06/02/18 [phone]

## 2018-05-26 NOTE — ED Triage Notes (Signed)
Pt to ED with c/o vaginal irritation and foul discharge x's 1 week

## 2018-05-27 LAB — URINE CULTURE

## 2018-05-27 LAB — GC/CHLAMYDIA PROBE AMP (~~LOC~~) NOT AT ARMC
Chlamydia: NEGATIVE
Neisseria Gonorrhea: NEGATIVE

## 2018-06-27 IMAGING — CT CT ABD-PELV W/ CM
2 of 3 series · 11 of 46 positions shown, 12 images · IV contrast (Iohexol (Omnipaque 350))
Comparison: None.

CLINICAL DATA: 24-year-old female with severe sepsis, shortness of
breath and chest pain.



[Series 701: routine, idose (2) · axial · 0.74mm/px · z∈[+62,+462]mm · 8 of 92 slices shown, 9 images]
[im 6/92  soft-tissue]
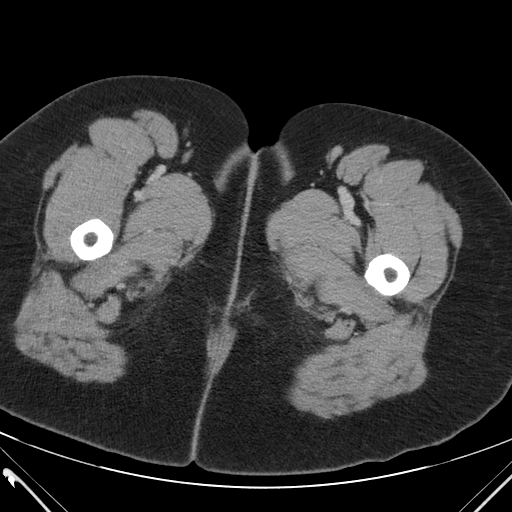
[im 6/92  bone]
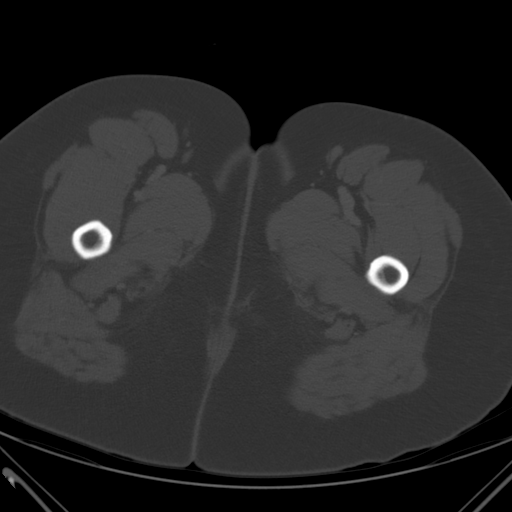
[im 18/92  soft-tissue]
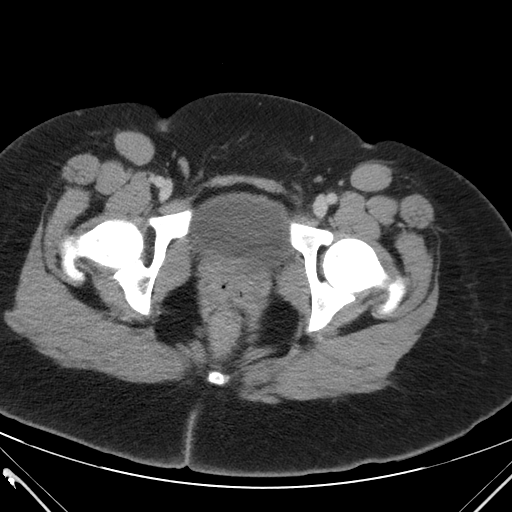
[im 30/92  soft-tissue]
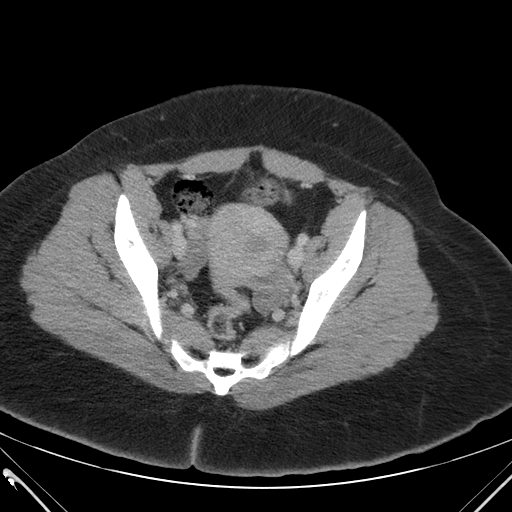
[im 42/92  soft-tissue]
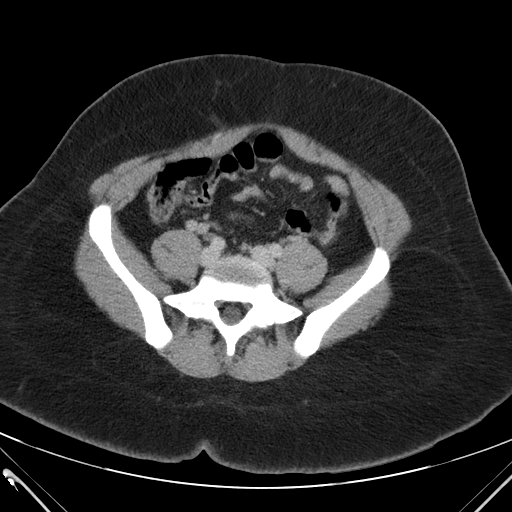
[im 50/92  soft-tissue]
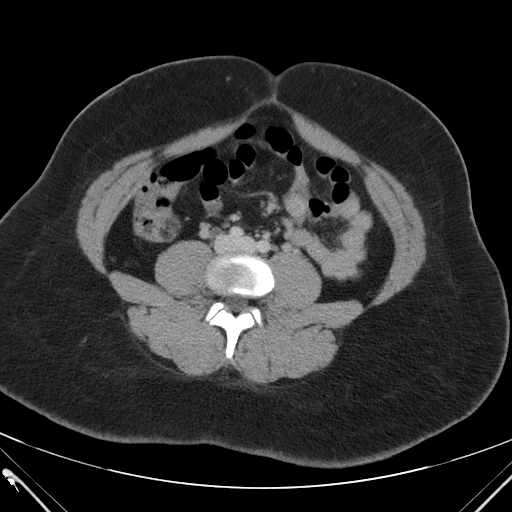
[im 62/92  soft-tissue]
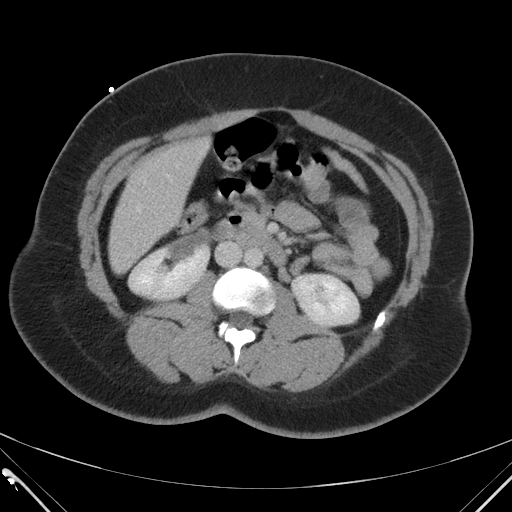
[im 74/92  soft-tissue]
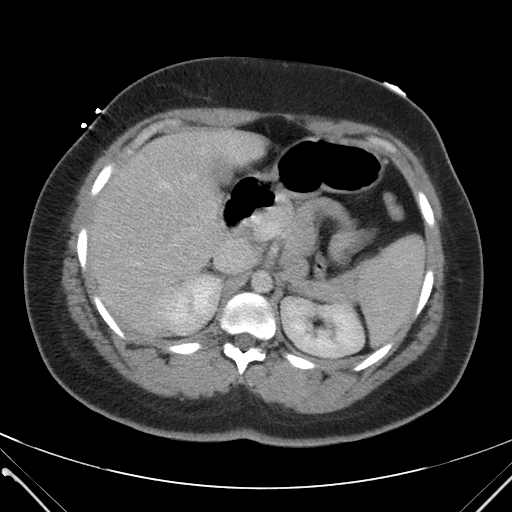
[im 86/92  soft-tissue]
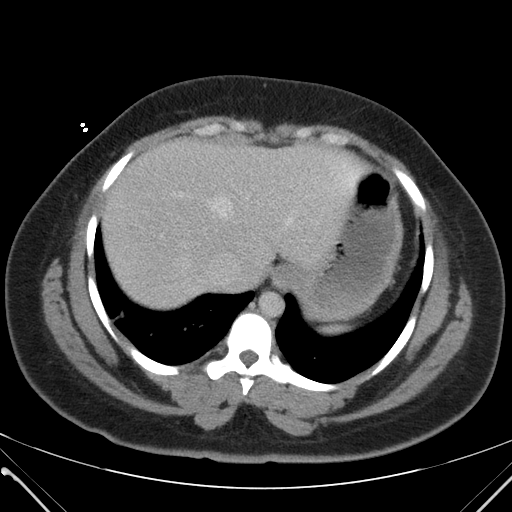

[Series 703: coronals, idose (2) · coronal · 0.45mm/px · 3 of 142 slices shown]
[im 48/142  soft-tissue]
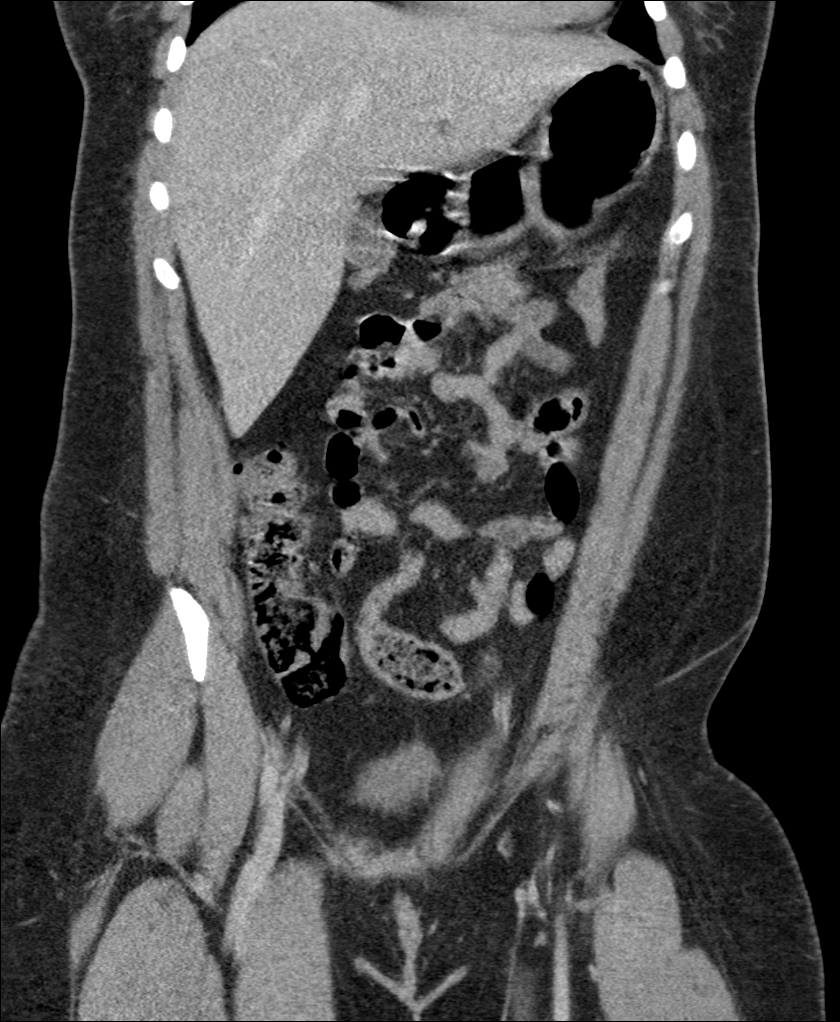
[im 63/142  soft-tissue]
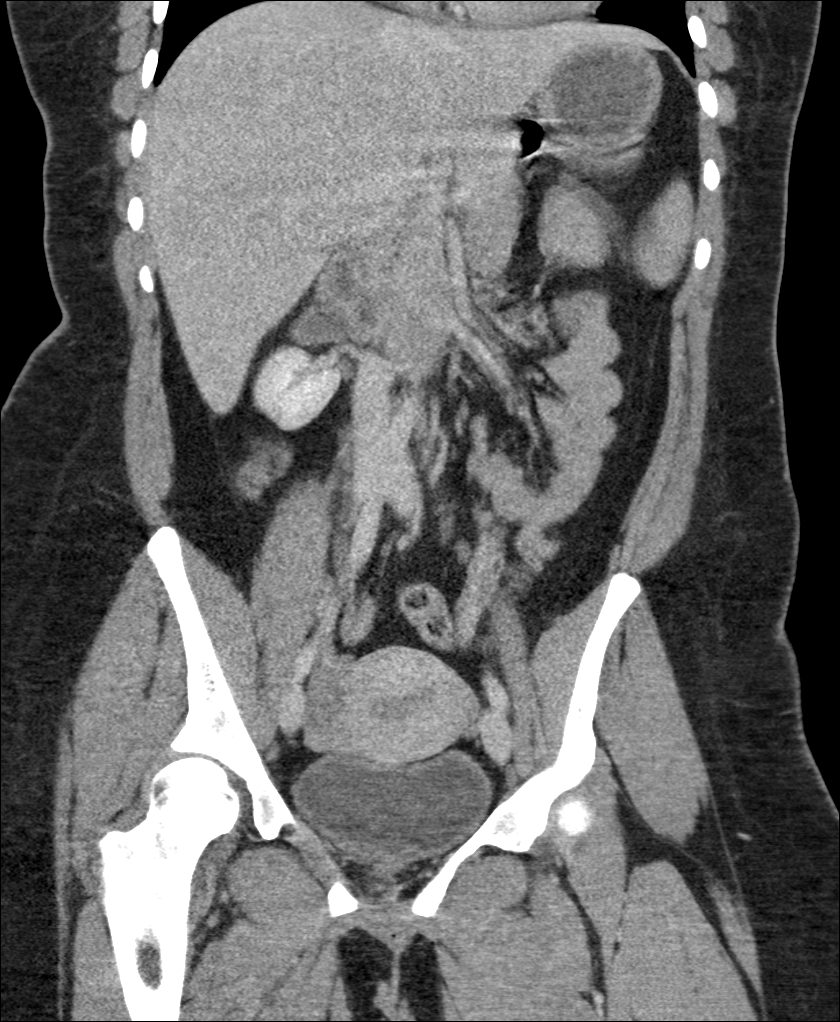
[im 79/142  soft-tissue]
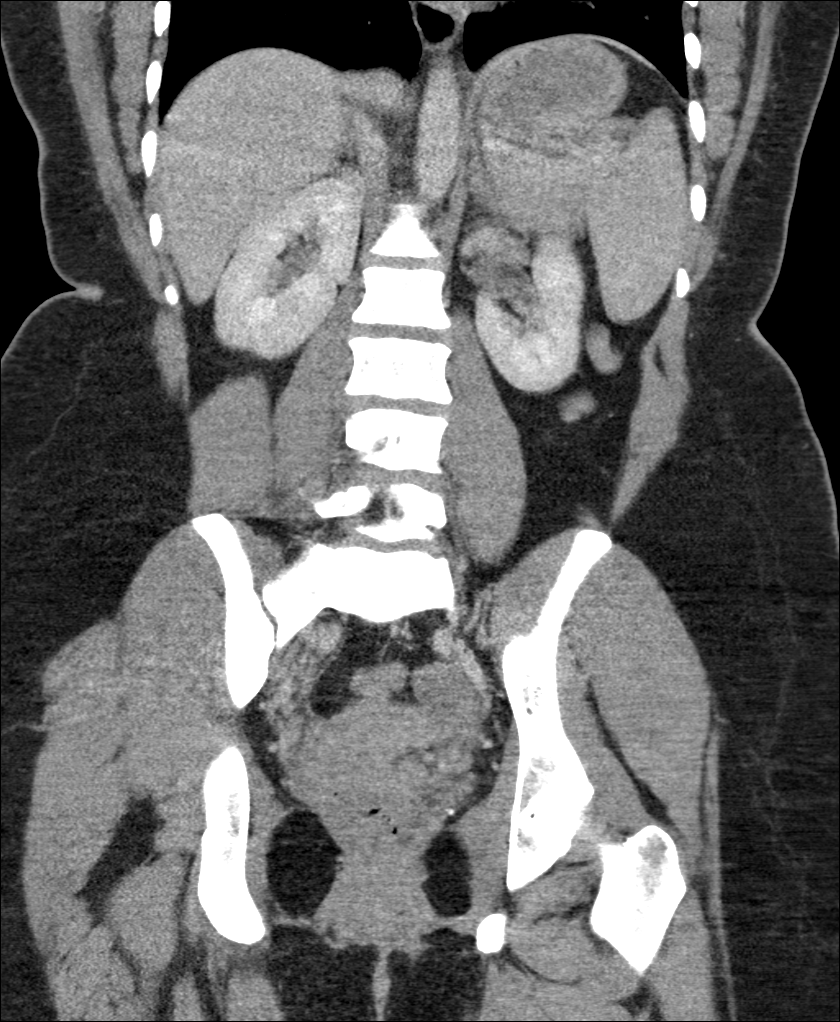

[11 of 46 positions shown; findings below may reference images not displayed]

FINDINGS: CTA CHEST FINDINGS

Cardiovascular: The study is moderate quality for the evaluation of
pulmonary embolism, with limited evaluation of the subsegmental
pulmonary arteries due to motion and limited contrast opacification.
There are no filling defects in the central, lobar or segmental
pulmonary artery branches to suggest acute pulmonary embolism.
Normal course and caliber of the thoracic aorta. Top-normal caliber
main pulmonary artery (3.2 cm diameter). Normal heart size. No
significant pericardial fluid/thickening.

Mediastinum/Nodes: No discrete thyroid nodules. Unremarkable
esophagus. No pathologically enlarged axillary, mediastinal or hilar
lymph nodes.

Lungs/Pleura: No pneumothorax. No pleural effusion. No acute
consolidative airspace disease, lung masses or significant pulmonary
nodules. There are mild patchy ground-glass opacities in the upper
lungs, most prominent in the right upper lobe.

Musculoskeletal:  No aggressive appearing focal osseous lesions.

Review of the MIP images confirms the above findings.

CT ABDOMEN and PELVIS FINDINGS

Motion degraded study .

Hepatobiliary: Normal liver with no liver mass. Normal gallbladder
with no radiopaque cholelithiasis. No biliary ductal dilatation.

Pancreas: Normal, with no mass or duct dilation.

Spleen: Normal size. No mass.

Adrenals/Urinary Tract: Normal adrenals. Normal kidneys with no
hydronephrosis and no renal mass. Normal bladder.

Stomach/Bowel: Grossly normal stomach. Normal caliber small bowel
with no small bowel wall thickening. Normal appendix . Normal large
bowel with no diverticulosis, large bowel wall thickening or
pericolonic fat stranding.

Vascular/Lymphatic: Normal caliber abdominal aorta. Patent portal,
splenic, hepatic and renal veins. No pathologically enlarged lymph
nodes in the abdomen or pelvis.

Reproductive: Grossly normal uterus.  No adnexal mass.

Other: No pneumoperitoneum, ascites or focal fluid collection.

Musculoskeletal: No aggressive appearing focal osseous lesions.

Review of the MIP images confirms the above findings.
IMPRESSION: 1. No evidence of pulmonary embolism, with limitations as described
.
2. Mild patchy ground-glass opacities in the upper lungs, with a
broad differential including atypical infection, vasculitis or other
inflammatory etiologies.
3. No acute abnormality in the abdomen or pelvis. No evidence of
bowel obstruction or acute bowel inflammation. Normal appendix.

## 2018-07-13 DIAGNOSIS — N926 Irregular menstruation, unspecified: Secondary | ICD-10-CM | POA: Diagnosis not present

## 2018-07-13 DIAGNOSIS — G4719 Other hypersomnia: Secondary | ICD-10-CM | POA: Diagnosis not present

## 2018-07-13 DIAGNOSIS — F17218 Nicotine dependence, cigarettes, with other nicotine-induced disorders: Secondary | ICD-10-CM | POA: Diagnosis not present

## 2018-07-13 DIAGNOSIS — J45909 Unspecified asthma, uncomplicated: Secondary | ICD-10-CM | POA: Diagnosis not present

## 2018-07-13 DIAGNOSIS — F17211 Nicotine dependence, cigarettes, in remission: Secondary | ICD-10-CM | POA: Diagnosis not present

## 2018-07-13 DIAGNOSIS — F3162 Bipolar disorder, current episode mixed, moderate: Secondary | ICD-10-CM | POA: Diagnosis not present

## 2018-07-13 DIAGNOSIS — F329 Major depressive disorder, single episode, unspecified: Secondary | ICD-10-CM | POA: Diagnosis not present

## 2018-09-07 DIAGNOSIS — Z87898 Personal history of other specified conditions: Secondary | ICD-10-CM | POA: Diagnosis not present

## 2018-09-07 DIAGNOSIS — Z113 Encounter for screening for infections with a predominantly sexual mode of transmission: Secondary | ICD-10-CM | POA: Diagnosis not present

## 2018-09-07 DIAGNOSIS — N910 Primary amenorrhea: Secondary | ICD-10-CM | POA: Diagnosis not present

## 2018-09-21 DIAGNOSIS — E282 Polycystic ovarian syndrome: Secondary | ICD-10-CM | POA: Diagnosis not present

## 2018-09-21 DIAGNOSIS — Z113 Encounter for screening for infections with a predominantly sexual mode of transmission: Secondary | ICD-10-CM | POA: Diagnosis not present

## 2018-09-21 DIAGNOSIS — Z87898 Personal history of other specified conditions: Secondary | ICD-10-CM | POA: Diagnosis not present

## 2018-09-21 DIAGNOSIS — N910 Primary amenorrhea: Secondary | ICD-10-CM | POA: Diagnosis not present

## 2018-11-09 ENCOUNTER — Emergency Department (HOSPITAL_COMMUNITY): Payer: BC Managed Care – PPO

## 2018-11-09 ENCOUNTER — Other Ambulatory Visit: Payer: Self-pay

## 2018-11-09 ENCOUNTER — Emergency Department (HOSPITAL_COMMUNITY)
Admission: EM | Admit: 2018-11-09 | Discharge: 2018-11-09 | Disposition: A | Discharge status: Home / Self Care | Payer: BC Managed Care – PPO | Attending: Emergency Medicine | Admitting: Emergency Medicine

## 2018-11-09 DIAGNOSIS — J45901 Unspecified asthma with (acute) exacerbation: Secondary | ICD-10-CM | POA: Insufficient documentation

## 2018-11-09 DIAGNOSIS — E669 Obesity, unspecified: Secondary | ICD-10-CM | POA: Insufficient documentation

## 2018-11-09 DIAGNOSIS — R0602 Shortness of breath: Secondary | ICD-10-CM | POA: Diagnosis not present

## 2018-11-09 DIAGNOSIS — Z87891 Personal history of nicotine dependence: Secondary | ICD-10-CM | POA: Insufficient documentation

## 2018-11-09 DIAGNOSIS — Z79899 Other long term (current) drug therapy: Secondary | ICD-10-CM | POA: Insufficient documentation

## 2018-11-09 DIAGNOSIS — R05 Cough: Secondary | ICD-10-CM | POA: Diagnosis not present

## 2018-11-09 MED ORDER — DEXAMETHASONE 4 MG PO TABS
12.0000 mg | ORAL_TABLET | Freq: Once | ORAL | Status: AC
  Administered 2018-11-09: 09:00:00 12 mg via ORAL
  Filled 2018-11-09: qty 3

## 2018-11-09 MED ORDER — ALBUTEROL SULFATE HFA 108 (90 BASE) MCG/ACT IN AERS
2.0000 | INHALATION_SPRAY | Freq: Once | RESPIRATORY_TRACT | Status: AC
  Administered 2018-11-09: 2 via RESPIRATORY_TRACT
  Filled 2018-11-09: qty 6.7

## 2018-11-09 MED ORDER — DEXAMETHASONE 4 MG PO TABS: 4.0000 mg | ORAL_TABLET | Freq: Two times a day (BID) | ORAL | 0 refills | Status: DC

## 2018-11-09 NOTE — ED Provider Notes (Signed)
[facility] EMERGENCY DEPARTMENT Provider Note   CSN: [id number] Arrival date & time: 11/09/18  [id number]     History   Chief Complaint No chief complaint on file.   HPI Kathy Flores is a 28 y.o. female.     HPI   28 year old female with dyspnea and cough.  Onset about 4 days ago.  She has a past history of asthma.  She states that current symptoms feel similar.  She has been using her albuterol inhaler and also neb treatments with only mild temporary improvement.  Cough is nonproductive.  No fevers or chills.  Chest feels tight at times.  No unusual leg pain or swelling.  Denies any GI complaints.  No sick contacts that she is aware of.  Past Medical History:  Diagnosis Date  . Asthma    inhaler used 3x month  . Depression   . Fibroid   . Trichomonas infection   . Vaginal Pap smear, abnormal     Patient Active Problem List   Diagnosis Date Noted  . Asthma exacerbation   . Metabolic acidosis   . Acute respiratory failure with hypoxemia ([place]) 09/23/2015  . SIRS (systemic inflammatory response syndrome) ([place]) 09/23/2015  . Normochromic normocytic anemia 09/23/2015  . Lactic acidosis   . Encounter for observation of infant for suspected infection   . Post term pregnancy, 41 weeks 11/22/2013  . Post-term pregnancy, 40-42 weeks of gestation   . [redacted] weeks gestation of pregnancy     Past Surgical History:  Procedure Laterality Date  . CESAREAN SECTION N/A 11/23/2013   Procedure: CESAREAN SECTION;  Surgeon: Woodroe Mode, MD;  Location: [facility];  Service: Obstetrics;  Laterality: N/A;     OB History    Gravida  3   Para  3   Term  3   Preterm      AB      Living  3     SAB      TAB      Ectopic      Multiple  0   Live Births  3            Home Medications    Prior to Admission medications   Medication Sig Start Date End Date Taking? Authorizing Provider  ibuprofen (ADVIL,MOTRIN) 200 MG tablet Take 800 mg by mouth every 6  (six) hours as needed (pain).    [provider]  lurasidone (LATUDA) 20 MG TABS tablet Take 20 mg by mouth daily. 03/24/18   [provider]  metroNIDAZOLE (FLAGYL) 500 MG tablet Take 1 tablet (500 mg total) by mouth 2 (two) times daily. 05/26/18   Kendrick, Caitlyn S, PA-C  traZODone (DESYREL) 50 MG tablet Take 50 mg by mouth at bedtime.     [provider]    Family History Family History  Problem Relation Age of Onset  . Asthma Sister   . Healthy Mother   . Healthy Father     Social History Social History   Tobacco Use  . Smoking status: Former Research scientist (life sciences)  . Smokeless tobacco: Never Used  Substance Use Topics  . Alcohol use: No  . Drug use: No     Allergies   Keflet [cephalexin] and Penicillins   Review of Systems Review of Systems All systems reviewed and negative, other than as noted in HPI.   Physical Exam Updated Vital Signs Ht 5\' 3"  (1.6 m)   Wt 77.1 kg   BMI 30.11 kg/m  Physical Exam Vitals signs and nursing note reviewed.  Constitutional:      General: She is not in acute distress.    Appearance: She is well-developed. She is obese.  HENT:     Head: Normocephalic and atraumatic.  Eyes:     General:        Right eye: No discharge.        Left eye: No discharge.     Conjunctiva/sclera: Conjunctivae normal.  Neck:     Musculoskeletal: Neck supple.  Cardiovascular:     Rate and Rhythm: Normal rate and regular rhythm.     Heart sounds: Normal heart sounds. No murmur. No friction rub. No gallop.   Pulmonary:     Effort: Pulmonary effort is normal. No respiratory distress.     Breath sounds: Wheezing present.     Comments: Work of breathing does not seem significantly increased.  She can carry on a conversation without apparent difficulty.  She has faint expiratory wheezing in all lung fields. Abdominal:     General: There is no distension.     Palpations: Abdomen is soft.     Tenderness: There is no abdominal tenderness.   Musculoskeletal:        General: No tenderness.     Comments: Lower extremities symmetric as compared to each other. No calf tenderness. Negative Homan's. No palpable cords.   Skin:    General: Skin is warm and dry.  Neurological:     Mental Status: She is alert.  Psychiatric:        Behavior: Behavior normal.        Thought Content: Thought content normal.      ED Treatments / Results  Labs (all labs ordered are listed, but only abnormal results are displayed) Labs Reviewed - No data to display  EKG None  Radiology No results found.  Procedures Procedures (including critical care time)  Medications Ordered in ED Medications  dexamethasone (DECADRON) tablet 12 mg (has no administration in time range)  albuterol (VENTOLIN HFA) 108 (90 Base) MCG/ACT inhaler 2 puff (has no administration in time range)     Initial Impression / Assessment and Plan / ED Course  I have reviewed the triage vital signs and the nursing notes.  Pertinent labs & imaging results that were available during my care of the patient were reviewed by me and considered in my medical decision making (see chart for details).     28 year old female with dyspnea.  Faint wheezing on exam.  Clinically suspect mild asthma exacerbation.  She is afebrile.  Generally well-appearing.  No signs/symptoms of DVT.Doubt PE.  Chest x-ray without focal infiltrate, edema, or other concerning acute finding.  Will place on a course of steroids.  Return precautions were discussed.  Final Clinical Impressions(s) / ED Diagnoses   Final diagnoses:  Mild asthma with exacerbation, unspecified whether persistent    ED Discharge Orders    None       Virgel Manifold, MD 11/09/18 [phone]

## 2018-11-09 NOTE — ED Triage Notes (Signed)
States 4 day hx of cough and difficulty breathing, using inhaler every other night and during the day as needed.  No fever or chills, some slight SOB.  Alert and oriented with NAD.

## 2018-11-23 DIAGNOSIS — Z3202 Encounter for pregnancy test, result negative: Secondary | ICD-10-CM | POA: Diagnosis not present

## 2018-11-23 DIAGNOSIS — F17210 Nicotine dependence, cigarettes, uncomplicated: Secondary | ICD-10-CM | POA: Diagnosis not present

## 2018-11-23 DIAGNOSIS — Z6841 Body Mass Index (BMI) 40.0 and over, adult: Secondary | ICD-10-CM | POA: Diagnosis not present

## 2018-11-23 DIAGNOSIS — E65 Localized adiposity: Secondary | ICD-10-CM | POA: Diagnosis not present

## 2018-11-23 DIAGNOSIS — N910 Primary amenorrhea: Secondary | ICD-10-CM | POA: Diagnosis not present

## 2019-01-07 NOTE — L&D Delivery Note (Signed)
[facility] Delivery Note  Kathy Flores is a 29 y.o. [phone] s/p SVD at [redacted]w[redacted]d. She was admitted for PROM.   ROM: 10h 51m with thick meconium fluid GBS Status:  Positive/-- (09/03 1132) received clindamycin Maximum Maternal Temperature: 99  Labor Progress: . Initial SVE: 2/50/-3. She then progressed to complete.   Delivery Date/Time: [id number] on 9/22  Delivery: Called to room and patient was complete and pushing. NICU present for delivery due to thick meconium. Head delivered LOA. Loose nuchal cord present and reduced. Shoulder and body delivered in usual fashion. Infant without spontaneous cry, cord clamped x 2 and cut without delay due to poor fetal tone and absent cry. Handed off to NICU team. Cord gas obtained. Cord blood drawn. Placenta delivered spontaneously with gentle cord traction. Fundus firm with massage and Pitocin. Labia, perineum, vagina, and cervix inspected inspected with no tears.  Baby Weight: pending  Placenta: Sent to pathology Complications: None Lacerations: none EBL: 100 mL Analgesia: Epidural   Infant:  APGAR (1 MIN): 3   APGAR (5 MINS): 7   APGAR (10 MINS): 9     [name], MD [facility] Family Medicine Fellow, [facility] for [facility] 09/28/2019, 7:35 AM

## 2019-04-16 ENCOUNTER — Emergency Department (HOSPITAL_COMMUNITY)
Admission: EM | Admit: 2019-04-16 | Discharge: 2019-04-16 | Disposition: A | Discharge status: Home / Self Care | Payer: Medicaid Other | Attending: Emergency Medicine | Admitting: Emergency Medicine

## 2019-04-16 ENCOUNTER — Encounter (HOSPITAL_COMMUNITY): Payer: Self-pay | Admitting: Emergency Medicine

## 2019-04-16 ENCOUNTER — Other Ambulatory Visit: Payer: Self-pay

## 2019-04-16 DIAGNOSIS — J45909 Unspecified asthma, uncomplicated: Secondary | ICD-10-CM | POA: Insufficient documentation

## 2019-04-16 DIAGNOSIS — R0981 Nasal congestion: Secondary | ICD-10-CM | POA: Diagnosis not present

## 2019-04-16 DIAGNOSIS — J039 Acute tonsillitis, unspecified: Secondary | ICD-10-CM | POA: Insufficient documentation

## 2019-04-16 DIAGNOSIS — J029 Acute pharyngitis, unspecified: Secondary | ICD-10-CM | POA: Diagnosis present

## 2019-04-16 DIAGNOSIS — R05 Cough: Secondary | ICD-10-CM | POA: Diagnosis not present

## 2019-04-16 DIAGNOSIS — Z20822 Contact with and (suspected) exposure to covid-19: Secondary | ICD-10-CM | POA: Insufficient documentation

## 2019-04-16 DIAGNOSIS — R059 Cough, unspecified: Secondary | ICD-10-CM

## 2019-04-16 LAB — SARS CORONAVIRUS 2 (TAT 6-24 HRS): SARS Coronavirus 2: NEGATIVE

## 2019-04-16 MED ORDER — ALBUTEROL SULFATE HFA 108 (90 BASE) MCG/ACT IN AERS: 2.0000 | INHALATION_SPRAY | RESPIRATORY_TRACT | 0 refills | Status: DC | PRN

## 2019-04-16 MED ORDER — FLUTICASONE PROPIONATE 50 MCG/ACT NA SUSP: 2.0000 | Freq: Every day | NASAL | 0 refills | Status: DC

## 2019-04-16 MED ORDER — BENZONATATE 100 MG PO CAPS: 100.0000 mg | ORAL_CAPSULE | Freq: Three times a day (TID) | ORAL | 0 refills | Status: DC

## 2019-04-16 MED ORDER — CLINDAMYCIN HCL 300 MG PO CAPS: 300.0000 mg | ORAL_CAPSULE | Freq: Three times a day (TID) | ORAL | 0 refills | Status: AC

## 2019-04-16 NOTE — ED Triage Notes (Signed)
C/o sore throat, runny nose, and hot flashes since yesterday.

## 2019-04-16 NOTE — ED Notes (Signed)
The pt is c/o a cold cough runny nose  Since yesterday  No temp  No distress

## 2019-04-16 NOTE — Discharge Instructions (Addendum)
Take the medications as prescribed.  You will be called if your Covid test is positive.  Make sure to rest and drink plenty of fluids

## 2019-04-16 NOTE — ED Provider Notes (Signed)
[facility] Provider Note   CSN: [id number] Arrival date & time: 04/16/19  1428     History Chief Complaint  Patient presents with  . Sore Throat  . Nasal Congestion    Kathy [facility] is a 29 y.o. female with past medical history significant for asthma, depression who presents for evaluation of upper respiratory symptoms.  Patient with congestion, rhinorrhea, sore throat, nonproductive cough x3 days.  Has not taken anything for symptoms.  Has been tolerating p.o. intake at home without difficulty however has sore throat.  Nonpurulent rhinorrhea.  Denies headache, lightheadedness, chest pain, shortness of breath, hemoptysis abdominal pain, diarrhea, dysuria, lateral leg swelling, redness, warmth, weakness.  Denies aggravating or alleviating factors.  Describes her pain as scratchy throat.  Which she rates a 5/10.   History obtained from patient and past medical records.  No interpreter is used.  HPI     Past Medical History:  Diagnosis Date  . Asthma    inhaler used 3x month  . Depression   . Fibroid   . Trichomonas infection   . Vaginal Pap smear, abnormal     Patient Active Problem List   Diagnosis Date Noted  . Asthma exacerbation   . Metabolic acidosis   . Acute respiratory failure with hypoxemia ([place]) 09/23/2015  . SIRS (systemic inflammatory response syndrome) ([place]) 09/23/2015  . Normochromic normocytic anemia 09/23/2015  . Lactic acidosis   . Encounter for observation of infant for suspected infection   . Post term pregnancy, 41 weeks 11/22/2013  . Post-term pregnancy, 40-42 weeks of gestation   . [redacted] weeks gestation of pregnancy     Past Surgical History:  Procedure Laterality Date  . CESAREAN SECTION N/A 11/23/2013   Procedure: CESAREAN SECTION;  Surgeon: Woodroe Mode, MD;  Location: [facility];  Service: Obstetrics;  Laterality: N/A;     OB History    Gravida  3   Para  3   Term  3   Preterm      AB      Living  3     SAB      TAB      Ectopic      Multiple  0   Live Births  3           Family History  Problem Relation Age of Onset  . Asthma Sister   . Healthy Mother   . Healthy Father     Social History   Tobacco Use  . Smoking status: Former Research scientist (life sciences)  . Smokeless tobacco: Never Used  Substance Use Topics  . Alcohol use: No  . Drug use: No    Home Medications Prior to Admission medications   Medication Sig Start Date End Date Taking? Authorizing Provider  albuterol (VENTOLIN HFA) 108 (90 Base) MCG/ACT inhaler Inhale 2 puffs into the lungs every 4 (four) hours as needed for wheezing or shortness of breath. 04/16/19   Ursula Dermody A, PA-C  benzonatate (TESSALON) 100 MG capsule Take 1 capsule (100 mg total) by mouth every 8 (eight) hours. 04/16/19   Markcus Lazenby A, PA-C  clindamycin (CLEOCIN) 300 MG capsule Take 1 capsule (300 mg total) by mouth 3 (three) times daily for 5 days. 04/16/19 04/21/19  Adair Lauderback A, PA-C  dexamethasone (DECADRON) 4 MG tablet Take 1 tablet (4 mg total) by mouth 2 (two) times daily. 11/09/18   Virgel Manifold, MD  fluticasone (FLONASE) 50 MCG/ACT nasal spray Place 2 sprays into  both nostrils daily. 04/16/19   Hanford Lust A, PA-C  ibuprofen (ADVIL,MOTRIN) 200 MG tablet Take 800 mg by mouth every 6 (six) hours as needed (pain).    [provider]  lurasidone (LATUDA) 20 MG TABS tablet Take 20 mg by mouth daily. 03/24/18   [provider]  metroNIDAZOLE (FLAGYL) 500 MG tablet Take 1 tablet (500 mg total) by mouth 2 (two) times daily. 05/26/18   Kendrick, Caitlyn S, PA-C  traZODone (DESYREL) 50 MG tablet Take 50 mg by mouth at bedtime.     [provider]    Allergies    Keflet [cephalexin] and Penicillins  Review of Systems   Review of Systems  Constitutional: Positive for chills and fatigue.  HENT: Positive for congestion, postnasal drip, rhinorrhea, sinus pressure and sore throat. Negative for ear  discharge, ear pain, facial swelling, hearing loss, mouth sores, nosebleeds, sinus pain, sneezing, tinnitus, trouble swallowing and voice change.   Respiratory: Positive for cough. Negative for apnea, choking, chest tightness, shortness of breath, wheezing and stridor.   Cardiovascular: Negative.   Gastrointestinal: Negative.   Genitourinary: Negative.   Musculoskeletal: Negative.   Skin: Negative.   Neurological: Negative.   All other systems reviewed and are negative.  Physical Exam Updated Vital Signs BP 115/72 (BP Location: Left Arm)   Pulse 95   Temp 98.7 F (37.1 C) (Oral)   Resp 14   Ht 5\' 2"  (1.575 m)   Wt 79.4 kg   SpO2 98%   BMI 32.01 kg/m   Physical Exam Vitals and nursing note reviewed.  Constitutional:      General: She is not in acute distress.    Appearance: She is not ill-appearing, toxic-appearing or diaphoretic.  HENT:     Head: Normocephalic and atraumatic.     Jaw: There is normal jaw occlusion.     Right Ear: Tympanic membrane, ear canal and external ear normal. There is no impacted cerumen. No hemotympanum. Tympanic membrane is not injected, scarred, perforated, erythematous, retracted or bulging.     Left Ear: Tympanic membrane, ear canal and external ear normal. There is no impacted cerumen. No hemotympanum. Tympanic membrane is not injected, scarred, perforated, erythematous, retracted or bulging.     Ears:     Comments: No Mastoid tenderness.    Nose:     Comments: Clear rhinorrhea and congestion to bilateral nares.  No sinus tenderness.    Mouth/Throat:     Lips: Pink.     Mouth: Mucous membranes are moist.     Pharynx: Uvula midline.     Tonsils: No tonsillar abscesses.     Comments: Posterior oropharynx erythematous with exudate. Uvula midline without deviation.  No evidence of PTA or RPA.  No drooling, dysphasia or trismus.  Phonation normal. Neck:     Trachea: Trachea and phonation normal.     Meningeal: Brudzinski's sign and Kernig's sign  absent.     Comments: No Neck stiffness or neck rigidity.  No meningismus.  No cervical lymphadenopathy. Cardiovascular:     Comments: No murmurs rubs or gallops. Pulmonary:     Comments: Clear to auscultation bilaterally without wheeze, rhonchi or rales.  No accessory muscle usage.  Able speak in full sentences. Abdominal:     Comments: Soft, nontender without rebound or guarding.  No CVA tenderness.  Musculoskeletal:     Comments: Moves all 4 extremities without difficulty.  Lower extremities without edema, erythema or warmth.  Skin:    Comments: Brisk capillary refill.  No rashes or lesions.  Neurological:     Mental Status: She is alert.     Comments: Ambulatory in department without difficulty.  Cranial nerves II through XII grossly intact.  No facial droop.  No aphasia.     ED Results / Procedures / Treatments   Labs (all labs ordered are listed, but only abnormal results are displayed) Labs Reviewed  SARS CORONAVIRUS 2 (TAT 6-24 HRS)    EKG None  Radiology No results found.  Procedures Procedures (including critical care time)  Medications Ordered in ED Medications - No data to display  ED Course  I have reviewed the triage vital signs and the nursing notes.  Pertinent labs & imaging results that were available during my care of the patient were reviewed by me and considered in my medical decision making (see chart for details).  29 year old female appears otherwise well presents for evaluation of upper respiratory complaints.  Afebrile, nonseptic, non-ill-appearing.  Patient with posterior oropharynx which is erythematous with exudates.  No evidence of PTA or RPA.  Sublingual area soft.  No drooling, dysphagia or trismus.  She does have clear rhinorrhea and congestion to bilateral nares.  No neck stiffness or neck rigidity.  No meningismus.  Heart and lungs clear.  She is tolerating p.o. intake without difficulty.  No evidence of otitis on exam.  Abdomen soft,  nontender.  Given exudate and erythema to oropharynx will send home with antibiotics given concern for bacterial source.  Allergy to penicillins will send home on [place]. will obtain Covid test.   The patient has been appropriately medically screened and/or stabilized in the ED. I have low suspicion for any other emergent medical condition which would require further screening, evaluation or treatment in the ED or require inpatient management.  Patient is hemodynamically stable and in no acute distress.  Patient able to ambulate in department prior to ED.  Evaluation does not show acute pathology that would require ongoing or additional emergent interventions while in the emergency department or further inpatient treatment.  I have discussed the diagnosis with the patient and answered all questions.  Pain is been managed while in the emergency department and patient has no further complaints prior to discharge.  Patient is comfortable with plan discussed in room and is stable for discharge at this time.  I have discussed strict return precautions for returning to the emergency department.  Patient was encouraged to follow-up with PCP/specialist refer to at discharge.    MDM Rules/Calculators/A&P                     [facility] was evaluated in Emergency Department on 04/16/2019 for the symptoms described in the history of present illness. She was evaluated in the context of the global COVID-19 pandemic, which necessitated consideration that the patient might be at risk for infection with the SARS-CoV-2 virus that causes COVID-19. Institutional protocols and algorithms that pertain to the evaluation of patients at risk for COVID-19 are in a state of rapid change based on information released by regulatory bodies including the CDC and federal and state organizations. These policies and algorithms were followed during the patient's care in the ED. Final Clinical Impression(s) / ED Diagnoses Final  diagnoses:  Tonsillitis  Cough  COVID-19 virus test result unknown    Rx / DC Orders ED Discharge Orders         Ordered    clindamycin (CLEOCIN) 300 MG capsule  3 times daily  04/16/19 1528    benzonatate (TESSALON) 100 MG capsule  Every 8 hours     04/16/19 1528    fluticasone (FLONASE) 50 MCG/ACT nasal spray  Daily     04/16/19 1528    albuterol (VENTOLIN HFA) 108 (90 Base) MCG/ACT inhaler  Every 4 hours PRN     04/16/19 1528           Maudy Yonan A, PA-C 04/16/19 1532    Isla Pence, MD 04/16/19 1538

## 2019-05-25 ENCOUNTER — Other Ambulatory Visit: Payer: Self-pay

## 2019-05-25 ENCOUNTER — Inpatient Hospital Stay (HOSPITAL_COMMUNITY)
Admission: AD | Admit: 2019-05-25 | Discharge: 2019-05-26 | Disposition: A | Discharge status: Home / Self Care | Payer: Medicaid Other | Attending: Obstetrics and Gynecology | Admitting: Obstetrics and Gynecology

## 2019-05-25 ENCOUNTER — Encounter (HOSPITAL_COMMUNITY): Payer: Self-pay | Admitting: Obstetrics and Gynecology

## 2019-05-25 ENCOUNTER — Inpatient Hospital Stay (HOSPITAL_COMMUNITY): Payer: Medicaid Other

## 2019-05-25 DIAGNOSIS — O26892 Other specified pregnancy related conditions, second trimester: Secondary | ICD-10-CM | POA: Insufficient documentation

## 2019-05-25 DIAGNOSIS — Z87891 Personal history of nicotine dependence: Secondary | ICD-10-CM | POA: Diagnosis not present

## 2019-05-25 DIAGNOSIS — F329 Major depressive disorder, single episode, unspecified: Secondary | ICD-10-CM | POA: Insufficient documentation

## 2019-05-25 DIAGNOSIS — R102 Pelvic and perineal pain: Secondary | ICD-10-CM

## 2019-05-25 DIAGNOSIS — O99342 Other mental disorders complicating pregnancy, second trimester: Secondary | ICD-10-CM | POA: Insufficient documentation

## 2019-05-25 DIAGNOSIS — O26842 Uterine size-date discrepancy, second trimester: Secondary | ICD-10-CM | POA: Diagnosis not present

## 2019-05-25 DIAGNOSIS — J45909 Unspecified asthma, uncomplicated: Secondary | ICD-10-CM | POA: Insufficient documentation

## 2019-05-25 DIAGNOSIS — R109 Unspecified abdominal pain: Secondary | ICD-10-CM | POA: Diagnosis not present

## 2019-05-25 DIAGNOSIS — Z79899 Other long term (current) drug therapy: Secondary | ICD-10-CM | POA: Diagnosis not present

## 2019-05-25 DIAGNOSIS — O99512 Diseases of the respiratory system complicating pregnancy, second trimester: Secondary | ICD-10-CM | POA: Diagnosis not present

## 2019-05-25 DIAGNOSIS — Z3A21 21 weeks gestation of pregnancy: Secondary | ICD-10-CM | POA: Diagnosis not present

## 2019-05-25 DIAGNOSIS — O3680X0 Pregnancy with inconclusive fetal viability, not applicable or unspecified: Secondary | ICD-10-CM | POA: Diagnosis not present

## 2019-05-25 DIAGNOSIS — O4442 Low lying placenta NOS or without hemorrhage, second trimester: Secondary | ICD-10-CM | POA: Diagnosis not present

## 2019-05-25 DIAGNOSIS — O4440 Low lying placenta NOS or without hemorrhage, unspecified trimester: Secondary | ICD-10-CM | POA: Diagnosis present

## 2019-05-25 LAB — URINALYSIS, ROUTINE W REFLEX MICROSCOPIC
Bilirubin Urine: NEGATIVE
Glucose, UA: NEGATIVE mg/dL
Hgb urine dipstick: NEGATIVE
Ketones, ur: 20 mg/dL — AB
Nitrite: NEGATIVE
Protein, ur: NEGATIVE mg/dL
Specific Gravity, Urine: 1.015 (ref 1.005–1.030)
pH: 5 (ref 5.0–8.0)

## 2019-05-25 LAB — WET PREP, GENITAL
Sperm: NONE SEEN
Trich, Wet Prep: NONE SEEN
Yeast Wet Prep HPF POC: NONE SEEN

## 2019-05-25 LAB — CBC
HCT: 34.4 % — ABNORMAL LOW (ref 36.0–46.0)
Hemoglobin: 11.2 g/dL — ABNORMAL LOW (ref 12.0–15.0)
MCH: 27.8 pg (ref 26.0–34.0)
MCHC: 32.6 g/dL (ref 30.0–36.0)
MCV: 85.4 fL (ref 80.0–100.0)
Platelets: 236 10*3/uL (ref 150–400)
RBC: 4.03 MIL/uL (ref 3.87–5.11)
RDW: 13.9 % (ref 11.5–15.5)
WBC: 12.2 10*3/uL — ABNORMAL HIGH (ref 4.0–10.5)
nRBC: 0 % (ref 0.0–0.2)

## 2019-05-25 LAB — POCT PREGNANCY, URINE: Preg Test, Ur: POSITIVE — AB

## 2019-05-25 LAB — HCG, QUANTITATIVE, PREGNANCY: hCG, Beta Chain, Quant, S: 36702 m[IU]/mL — ABNORMAL HIGH (ref <5)

## 2019-05-25 LAB — HIV ANTIBODY (ROUTINE TESTING W REFLEX): HIV Screen 4th Generation wRfx: NONREACTIVE

## 2019-05-25 NOTE — ED Triage Notes (Addendum)
Pt presents to ED POV. Pt states that she found out she was pregnant today and has lower abdomen cramping. Pt says she wants to know how many weeks she is.

## 2019-05-25 NOTE — MAU Provider Note (Signed)
Chief Complaint: Routine Prenatal Visit and Abdominal Cramping   First Provider Initiated Contact with Patient 05/25/19 2150        SUBJECTIVE HPI: [facility] is a 29 y.o. [id number] at Unknown by LMP who presents to maternity admissions reporting lower abdominal cramping for several days.  No bleeding.  . She denies vaginal bleeding, vaginal itching/burning, urinary symptoms, h/a, dizziness, n/v, or fever/chills.    Abdominal Cramping This is a new problem. The current episode started in the past 7 days. The onset quality is gradual. The problem occurs intermittently. The problem has been unchanged. The pain is located in the suprapubic region, LLQ and RLQ. The pain is mild. The quality of the pain is cramping. The abdominal pain does not radiate. Pertinent negatives include no constipation, diarrhea, dysuria, fever, frequency, myalgias, nausea or vomiting. Nothing aggravates the pain. The pain is relieved by nothing. She has tried nothing for the symptoms.    RN Note: Had a positive HPT today.  States she has been having some lower abdominal/pelvic cramping that comes and goes for the past few days.  Denies VB/abnormal discharge.  LMP sometime in February, states she has irregular periods  Past Medical History:  Diagnosis Date  . Asthma    inhaler used 3x month  . Depression   . Fibroid   . Trichomonas infection   . Vaginal Pap smear, abnormal    Past Surgical History:  Procedure Laterality Date  . CESAREAN SECTION N/A 11/23/2013   Procedure: CESAREAN SECTION;  Surgeon: Woodroe Mode, MD;  Location: [facility];  Service: Obstetrics;  Laterality: N/A;   Social History   Socioeconomic History  . Marital status: Single    Spouse name: Not on file  . Number of children: Not on file  . Years of education: Not on file  . Highest education level: Not on file  Occupational History  . Not on file  Tobacco Use  . Smoking status: Former Research scientist (life sciences)  . Smokeless tobacco: Never Used   Substance and Sexual Activity  . Alcohol use: No  . Drug use: No  . Sexual activity: Not Currently    Birth control/protection: None  Other Topics Concern  . Not on file  Social History Narrative  . Not on file   Social Determinants of Health   Financial Resource Strain:   . Difficulty of Paying Living Expenses:   Food Insecurity:   . Worried About Charity fundraiser in the Last Year:   . Arboriculturist in the Last Year:   Transportation Needs:   . Film/video editor (Medical):   Marland Kitchen Lack of Transportation (Non-Medical):   Physical Activity:   . Days of Exercise per Week:   . Minutes of Exercise per Session:   Stress:   . Feeling of Stress :   Social Connections:   . Frequency of Communication with Friends and Family:   . Frequency of Social Gatherings with Friends and Family:   . Attends Religious Services:   . Active Member of Clubs or Organizations:   . Attends Archivist Meetings:   Marland Kitchen Marital Status:   Intimate Partner Violence:   . Fear of Current or Ex-Partner:   . Emotionally Abused:   Marland Kitchen Physically Abused:   . Sexually Abused:    No current facility-administered medications on file prior to encounter.   Current Outpatient Medications on File Prior to Encounter  Medication Sig Dispense Refill  . diphenhydrAMINE (BENADRYL) 25 MG tablet  Take 25 mg by mouth every 6 (six) hours as needed.    . topiramate (TOPAMAX) 100 MG tablet Take 100 mg by mouth 2 (two) times daily.    . traZODone (DESYREL) 50 MG tablet Take 50 mg by mouth at bedtime.     Marland Kitchen albuterol (VENTOLIN HFA) 108 (90 Base) MCG/ACT inhaler Inhale 2 puffs into the lungs every 4 (four) hours as needed for wheezing or shortness of breath. 8 g 0  . benzonatate (TESSALON) 100 MG capsule Take 1 capsule (100 mg total) by mouth every 8 (eight) hours. 21 capsule 0  . dexamethasone (DECADRON) 4 MG tablet Take 1 tablet (4 mg total) by mouth 2 (two) times daily. 4 tablet 0  . fluticasone (FLONASE) 50  MCG/ACT nasal spray Place 2 sprays into both nostrils daily. 11.1 mL 0  . ibuprofen (ADVIL,MOTRIN) 200 MG tablet Take 800 mg by mouth every 6 (six) hours as needed (pain).    Marland Kitchen lurasidone (LATUDA) 20 MG TABS tablet Take 20 mg by mouth daily.    . metroNIDAZOLE (FLAGYL) 500 MG tablet Take 1 tablet (500 mg total) by mouth 2 (two) times daily. 14 tablet 0   Allergies  Allergen Reactions  . Keflet [Cephalexin] Itching  . Penicillins Hives and Rash    Has patient had a PCN reaction causing immediate rash, facial/tongue/throat swelling, SOB or lightheadedness with hypotension: Yes Has patient had a PCN reaction causing severe rash involving mucus membranes or skin necrosis: No Has patient had a PCN reaction that required hospitalization No Has patient had a PCN reaction occurring within the last 10 years: No If all of the above answers are "NO", then may proceed with Cephalosporin use.     I have reviewed patient's Past Medical Hx, Surgical Hx, Family Hx, Social Hx, medications and allergies.   ROS:  Review of Systems  Constitutional: Negative for fever.  Gastrointestinal: Negative for constipation, diarrhea, nausea and vomiting.  Genitourinary: Negative for dysuria and frequency.  Musculoskeletal: Negative for myalgias.   Review of Systems  Other systems negative   Physical Exam  Physical Exam Patient Vitals for the past 24 hrs:  BP Temp Temp src Pulse Resp SpO2 Weight  05/25/19 2108 112/73 98.3 F (36.8 C) -- 88 19 99 % 94.3 kg  05/25/19 1944 124/74 98.5 F (36.9 C) Oral 92 16 100 % --   Constitutional: Well-developed, well-nourished female in no acute distress.  Cardiovascular: normal rate Respiratory: normal effort GI: Abd soft, non-tender. Pos BS x 4 MS: Extremities nontender, no edema, normal ROM Neurologic: Alert and oriented x 4.  GU: Neg CVAT.  PELVIC EXAM: Cervix pink, visually closed, without lesion, scant white creamy discharge, vaginal walls and external  genitalia normal Bimanual exam: Cervix 0/long/high, firm, anterior, neg CMT, uterus nontender, enlarged, adnexa without tenderness, enlargement, or mass   LAB RESULTS Results for orders placed or performed during the hospital encounter of 05/25/19 (from the past 24 hour(s))  Urinalysis, Routine w reflex microscopic     Status: Abnormal   Collection Time: 05/25/19  9:12 PM  Result Value Ref Range   Color, Urine YELLOW YELLOW   APPearance HAZY (A) CLEAR   Specific Gravity, Urine 1.015 1.005 - 1.030   pH 5.0 5.0 - 8.0   Glucose, UA NEGATIVE NEGATIVE mg/dL   Hgb urine dipstick NEGATIVE NEGATIVE   Bilirubin Urine NEGATIVE NEGATIVE   Ketones, ur 20 (A) NEGATIVE mg/dL   Protein, ur NEGATIVE NEGATIVE mg/dL   Nitrite NEGATIVE NEGATIVE  Leukocytes,Ua MODERATE (A) NEGATIVE   RBC / HPF 0-5 0 - 5 RBC/hpf   WBC, UA 11-20 0 - 5 WBC/hpf   Bacteria, UA RARE (A) NONE SEEN   Squamous Epithelial / LPF 6-10 0 - 5   Mucus PRESENT   Pregnancy, urine POC     Status: Abnormal   Collection Time: 05/25/19  9:16 PM  Result Value Ref Range   Preg Test, Ur POSITIVE (A) NEGATIVE  HIV Antibody (routine testing w rflx)     Status: None   Collection Time: 05/25/19  9:46 PM  Result Value Ref Range   HIV Screen 4th Generation wRfx Non Reactive Non Reactive  hCG, quantitative, pregnancy     Status: Abnormal   Collection Time: 05/25/19  9:46 PM  Result Value Ref Range   hCG, Beta Chain, Quant, S 36,702 (H) <5 mIU/mL  CBC     Status: Abnormal   Collection Time: 05/25/19  9:46 PM  Result Value Ref Range   WBC 12.2 (H) 4.0 - 10.5 K/uL   RBC 4.03 3.87 - 5.11 MIL/uL   Hemoglobin 11.2 (L) 12.0 - 15.0 g/dL   HCT 34.4 (L) 36.0 - 46.0 %   MCV 85.4 80.0 - 100.0 fL   MCH 27.8 26.0 - 34.0 pg   MCHC 32.6 30.0 - 36.0 g/dL   RDW 13.9 11.5 - 15.5 %   Platelets 236 150 - 400 K/uL   nRBC 0.0 0.0 - 0.2 %  Wet prep, genital     Status: Abnormal   Collection Time: 05/25/19 10:21 PM  Result Value Ref Range   Yeast Wet  Prep HPF POC NONE SEEN NONE SEEN   Trich, Wet Prep NONE SEEN NONE SEEN   Clue Cells Wet Prep HPF POC PRESENT (A) NONE SEEN   WBC, Wet Prep HPF POC MANY (A) NONE SEEN   Sperm NONE SEEN      IMAGING Single IUP at [redacted]w[redacted]d Posterior Low Lying placenta 0.8cm from cervix Subjectively low normal fluid  MAU Management/MDM: Ordered usual first trimester r/o ectopic labs.   Pelvic exam and cultures done Will check baseline Ultrasound to rule out ectopic.  This bleeding/pain can represent a normal pregnancy with bleeding, spontaneous abortion or even an ectopic which can be life-threatening.  The process as listed above helps to determine which of these is present.  Reviewed findings with patient who is surprised to find out how far along she is Discussed prenatal care and message sent to get NOB appt Bleeding precautions  ASSESSMENT Pregnancy at [redacted]w[redacted]d by [place] Size > Dates Low Lying Placenta Subjectively low normal fluid  PLAN Discharge home Rx Prenatal vitamins Message sent to clinic for appt Ordered followup anatomy US and to check placenta  Pt stable at time of discharge. Encouraged to return here or to other Urgent Care/ED if she develops worsening of symptoms, increase in pain, fever, or other concerning symptoms.    Hansel Feinstein CNM, MSN Certified Nurse-Midwife 05/25/2019  9:50 PM

## 2019-05-25 NOTE — ED Notes (Signed)
Pt seen by ED provider. Per provider pt stable to go to MAU. RN called MAU charge

## 2019-05-25 NOTE — ED Triage Notes (Cosign Needed)
Patient seen in triage. Home pregnancy test was positive today. LMP 4 months ago. Patient reporting some mild cramping. Denies vaginal discharge, bleeding.   Vitals:   05/25/19 1944 05/25/19 2108  BP: 124/74 112/73  Pulse: 92 88  Resp: 16 19  Temp: 98.5 F (36.9 C) 98.3 F (36.8 C)  SpO2: 100% 99%   Vitals stable. Patient cleared for evaluation at MAU.

## 2019-05-25 NOTE — MAU Note (Signed)
Had a positive HPT today.  States she has been having some lower abdominal/pelvic cramping that comes and goes for the past few days.  Denies VB/abnormal discharge.  LMP sometime in February, states she has irregular periods.

## 2019-05-26 DIAGNOSIS — O4440 Low lying placenta NOS or without hemorrhage, unspecified trimester: Secondary | ICD-10-CM | POA: Diagnosis present

## 2019-05-26 DIAGNOSIS — O4442 Low lying placenta NOS or without hemorrhage, second trimester: Secondary | ICD-10-CM

## 2019-05-26 DIAGNOSIS — Z3A21 21 weeks gestation of pregnancy: Secondary | ICD-10-CM

## 2019-05-26 DIAGNOSIS — O26842 Uterine size-date discrepancy, second trimester: Secondary | ICD-10-CM

## 2019-05-26 LAB — GC/CHLAMYDIA PROBE AMP (~~LOC~~) NOT AT ARMC
Chlamydia: NEGATIVE
Comment: NEGATIVE
Comment: NORMAL
Neisseria Gonorrhea: NEGATIVE

## 2019-05-26 MED ORDER — PRENATAL VITAMIN 27-0.8 MG PO TABS: 1.0000 | ORAL_TABLET | Freq: Every day | ORAL | 12 refills | Status: AC

## 2019-05-26 NOTE — Discharge Instructions (Signed)
Second Trimester of Pregnancy The second trimester is from week 14 through week 27 (months 4 through 6). The second trimester is often a time when you feel your best. Your body has adjusted to being pregnant, and you begin to feel better physically. Usually, morning sickness has lessened or quit completely, you may have more energy, and you may have an increase in appetite. The second trimester is also a time when the fetus is growing rapidly. At the end of the sixth month, the fetus is about 9 inches long and weighs about 1 pounds. You will likely begin to feel the baby move (quickening) between 16 and 20 weeks of pregnancy. Body changes during your second trimester Your body continues to go through many changes during your second trimester. The changes vary from woman to woman.  Your weight will continue to increase. You will notice your lower abdomen bulging out.  You may begin to get stretch marks on your hips, abdomen, and breasts.  You may develop headaches that can be relieved by medicines. The medicines should be approved by your health care provider.  You may urinate more often because the fetus is pressing on your bladder.  You may develop or continue to have heartburn as a result of your pregnancy.  You may develop constipation because certain hormones are causing the muscles that push waste through your intestines to slow down.  You may develop hemorrhoids or swollen, bulging veins (varicose veins).  You may have back pain. This is caused by: ? Weight gain. ? Pregnancy hormones that are relaxing the joints in your pelvis. ? A shift in weight and the muscles that support your balance.  Your breasts will continue to grow and they will continue to become tender.  Your gums may bleed and may be sensitive to brushing and flossing.  Dark spots or blotches (chloasma, mask of pregnancy) may develop on your face. This will likely fade after the baby is born.  A dark line from your  belly button to the pubic area (linea nigra) may appear. This will likely fade after the baby is born.  You may have changes in your hair. These can include thickening of your hair, rapid growth, and changes in texture. Some women also have hair loss during or after pregnancy, or hair that feels dry or thin. Your hair will most likely return to normal after your baby is born. What to expect at prenatal visits During a routine prenatal visit:  You will be weighed to make sure you and the fetus are growing normally.  Your blood pressure will be taken.  Your abdomen will be measured to track your baby's growth.  The fetal heartbeat will be listened to.  Any test results from the previous visit will be discussed. Your health care provider may ask you:  How you are feeling.  If you are feeling the baby move.  If you have had any abnormal symptoms, such as leaking fluid, bleeding, severe headaches, or abdominal cramping.  If you are using any tobacco products, including cigarettes, chewing tobacco, and electronic cigarettes.  If you have any questions. Other tests that may be performed during your second trimester include:  Blood tests that check for: ? Low iron levels (anemia). ? High blood sugar that affects pregnant women (gestational diabetes) between 35 and 28 weeks. ? Rh antibodies. This is to check for a protein on red blood cells (Rh factor).  Urine tests to check for infections, diabetes, or protein in the  urine.  An ultrasound to confirm the proper growth and development of the baby.  An amniocentesis to check for possible genetic problems.  Fetal screens for spina bifida and Down syndrome.  HIV (human immunodeficiency virus) testing. Routine prenatal testing includes screening for HIV, unless you choose not to have this test. Follow these instructions at home: Medicines  Follow your health care provider's instructions regarding medicine use. Specific medicines may be  either safe or unsafe to take during pregnancy.  Take a prenatal vitamin that contains at least 600 micrograms (mcg) of folic acid.  If you develop constipation, try taking a stool softener if your health care provider approves. Eating and drinking   Eat a balanced diet that includes fresh fruits and vegetables, whole grains, good sources of protein such as meat, eggs, or tofu, and low-fat dairy. Your health care provider will help you determine the amount of weight gain that is right for you.  Avoid raw meat and uncooked cheese. These carry germs that can cause birth defects in the baby.  If you have low calcium intake from food, talk to your health care provider about whether you should take a daily calcium supplement.  Limit foods that are high in fat and processed sugars, such as fried and sweet foods.  To prevent constipation: ? Drink enough fluid to keep your urine clear or pale yellow. ? Eat foods that are high in fiber, such as fresh fruits and vegetables, whole grains, and beans. Activity  Exercise only as directed by your health care provider. Most women can continue their usual exercise routine during pregnancy. Try to exercise for 30 minutes at least 5 days a week. Stop exercising if you experience uterine contractions.  Avoid heavy lifting, wear low heel shoes, and practice good posture.  A sexual relationship may be continued unless your health care provider directs you otherwise. Relieving pain and discomfort  Wear a good support bra to prevent discomfort from breast tenderness.  Take warm sitz baths to soothe any pain or discomfort caused by hemorrhoids. Use hemorrhoid cream if your health care provider approves.  Rest with your legs elevated if you have leg cramps or low back pain.  If you develop varicose veins, wear support hose. Elevate your feet for 15 minutes, 3-4 times a day. Limit salt in your diet. Prenatal Care  Write down your questions. Take them to  your prenatal visits.  Keep all your prenatal visits as told by your health care provider. This is important. Safety  Wear your seat belt at all times when driving.  Make a list of emergency phone numbers, including numbers for family, friends, the hospital, and police and fire departments. General instructions  Ask your health care provider for a referral to a local prenatal education class. Begin classes no later than the beginning of month 6 of your pregnancy.  Ask for help if you have counseling or nutritional needs during pregnancy. Your health care provider can offer advice or refer you to specialists for help with various needs.  Do not use hot tubs, steam rooms, or saunas.  Do not douche or use tampons or scented sanitary pads.  Do not cross your legs for long periods of time.  Avoid cat litter boxes and soil used by cats. These carry germs that can cause birth defects in the baby and possibly loss of the fetus by miscarriage or stillbirth.  Avoid all smoking, herbs, alcohol, and unprescribed drugs. Chemicals in these products can affect the formation  and growth of the baby.  Do not use any products that contain nicotine or tobacco, such as cigarettes and e-cigarettes. If you need help quitting, ask your health care provider.  Visit your dentist if you have not gone yet during your pregnancy. Use a soft toothbrush to brush your teeth and be gentle when you floss. Contact a health care provider if:  You have dizziness.  You have mild pelvic cramps, pelvic pressure, or nagging pain in the abdominal area.  You have persistent nausea, vomiting, or diarrhea.  You have a bad smelling vaginal discharge.  You have pain when you urinate. Get help right away if:  You have a fever.  You are leaking fluid from your vagina.  You have spotting or bleeding from your vagina.  You have severe abdominal cramping or pain.  You have rapid weight gain or weight loss.  You have  shortness of breath with chest pain.  You notice sudden or extreme swelling of your face, hands, ankles, feet, or legs.  You have not felt your baby move in over an hour.  You have severe headaches that do not go away when you take medicine.  You have vision changes. Summary  The second trimester is from week 14 through week 27 (months 4 through 6). It is also a time when the fetus is growing rapidly.  Your body goes through many changes during pregnancy. The changes vary from woman to woman.  Avoid all smoking, herbs, alcohol, and unprescribed drugs. These chemicals affect the formation and growth your baby.  Do not use any tobacco products, such as cigarettes, chewing tobacco, and e-cigarettes. If you need help quitting, ask your health care provider.  Contact your health care provider if you have any questions. Keep all prenatal visits as told by your health care provider. This is important. This information is not intended to replace advice given to you by your health care provider. Make sure you discuss any questions you have with your health care provider. Document Revised: 04/16/2018 Document Reviewed: 01/29/2016 Elsevier Patient Education  [place] (You only have a low lying placenta, not a Previa)  Placenta previa is a condition in which the placenta implants in the lower part of the uterus in pregnant women. The placenta either partially or completely covers the opening to the cervix. This is a problem because the baby must pass through the cervix during delivery. There are three types of placenta previa:  Marginal placenta previa. The placenta reaches within an inch (2.5 cm) of the cervical opening but does not cover it.  Partial placenta previa. The placenta covers part of the cervical opening.  Complete placenta previa. The placenta covers the entire cervical opening. If the previa is marginal or partial and it is diagnosed in the first half of  pregnancy, the placenta may move into a normal position as the pregnancy progresses and may no longer cover the cervix. It is important to keep all prenatal visits with your health care provider so you can be more closely monitored. What are the causes? The cause of this condition is not known. What increases the risk? This condition is more likely to develop in women who:  Are carrying more than one baby (multiples).  Have an abnormally shaped uterus.  Have scars on the lining of the uterus.  Have had surgeries involving the uterus, such as a cesarean delivery.  Have delivered a baby before.  Have a history of placenta previa.  Have smoked or used cocaine during pregnancy.  Are age 62 or older during pregnancy. What are the signs or symptoms? The main symptom of this condition is sudden, painless vaginal bleeding during the second half of pregnancy. The amount of bleeding can be very light at first, and it usually stops on its own. Heavier bleeding episodes may also happen. Some women with placenta previa may have no bleeding at all. How is this diagnosed?  This condition is diagnosed: ? From an ultrasound. This test uses sound waves to find where the placenta is located before you have any bleeding episodes. ? During a checkup after vaginal bleeding is noticed.  If you are diagnosed with a partial or complete previa, digital exams with fingers will generally be avoided. Your health care provider will still perform a speculum exam.  If you did not have an ultrasound during your pregnancy, placenta previa may not be diagnosed until bleeding occurs during labor. How is this treated? Treatment for this condition may include:  Decreased activity.  Bed rest at home or in the hospital.  Pelvic rest. Nothing is placed inside the vagina during pelvic rest. This means not having sex and not using tampons or douches.  A blood transfusion to replace blood that you have lost (maternal  blood loss).  A cesarean delivery. This may be performed if: ? The bleeding is heavy and cannot be controlled. ? The placenta completely covers the cervix.  Medicines to stop premature labor or to help the baby's lungs to mature. This treatment may be used if you need delivery before your pregnancy is full-term. Your treatment will be decided based on:  How much you are bleeding, or whether the bleeding has stopped.  How far along you are in your pregnancy.  The condition of your baby.  The type of placenta previa that you have.  Follow these instructions at home:  Get plenty of rest and lessen activity as told by your health care provider.  Stay on bed rest for as long as told by your health care provider.  Do not have sex, use tampons, use a douche, or place anything inside of your vagina if your health care provider recommended pelvic rest.  Take over-the-counter and prescription medicines as told by your health care provider.  Keep all follow-up visits as told by your health care provider. This is important. Get help right away if:  You have vaginal bleeding, even if in small amounts and even if you have no pain.  You have cramping or regular contractions.  You have pain in your abdomen or your lower back.  You have a feeling of increased pressure in your pelvis.  You have increased watery or bloody mucus from the vagina. This information is not intended to replace advice given to you by your health care provider. Make sure you discuss any questions you have with your health care provider. Document Revised: 12/05/2016 Document Reviewed: 07/07/2015 Elsevier Patient Education  [place].

## 2019-05-29 ENCOUNTER — Inpatient Hospital Stay (HOSPITAL_COMMUNITY)
Admission: AD | Admit: 2019-05-29 | Discharge: 2019-05-31 | DRG: 832 | Disposition: A | Discharge status: Home / Self Care | Payer: Medicaid Other | Attending: Family Medicine | Admitting: Family Medicine

## 2019-05-29 ENCOUNTER — Encounter (HOSPITAL_COMMUNITY): Payer: Self-pay | Admitting: Obstetrics and Gynecology

## 2019-05-29 ENCOUNTER — Other Ambulatory Visit: Payer: Self-pay

## 2019-05-29 ENCOUNTER — Inpatient Hospital Stay (HOSPITAL_COMMUNITY): Payer: Medicaid Other

## 2019-05-29 DIAGNOSIS — O99513 Diseases of the respiratory system complicating pregnancy, third trimester: Secondary | ICD-10-CM

## 2019-05-29 DIAGNOSIS — E876 Hypokalemia: Secondary | ICD-10-CM | POA: Diagnosis present

## 2019-05-29 DIAGNOSIS — O99282 Endocrine, nutritional and metabolic diseases complicating pregnancy, second trimester: Secondary | ICD-10-CM | POA: Diagnosis present

## 2019-05-29 DIAGNOSIS — J4541 Moderate persistent asthma with (acute) exacerbation: Secondary | ICD-10-CM | POA: Diagnosis not present

## 2019-05-29 DIAGNOSIS — J45909 Unspecified asthma, uncomplicated: Secondary | ICD-10-CM

## 2019-05-29 DIAGNOSIS — Z825 Family history of asthma and other chronic lower respiratory diseases: Secondary | ICD-10-CM

## 2019-05-29 DIAGNOSIS — Z3A29 29 weeks gestation of pregnancy: Secondary | ICD-10-CM

## 2019-05-29 DIAGNOSIS — Z20822 Contact with and (suspected) exposure to covid-19: Secondary | ICD-10-CM | POA: Diagnosis present

## 2019-05-29 DIAGNOSIS — Z3A21 21 weeks gestation of pregnancy: Secondary | ICD-10-CM

## 2019-05-29 DIAGNOSIS — O99512 Diseases of the respiratory system complicating pregnancy, second trimester: Principal | ICD-10-CM | POA: Diagnosis present

## 2019-05-29 DIAGNOSIS — O0932 Supervision of pregnancy with insufficient antenatal care, second trimester: Secondary | ICD-10-CM

## 2019-05-29 DIAGNOSIS — E872 Acidosis: Secondary | ICD-10-CM | POA: Diagnosis present

## 2019-05-29 DIAGNOSIS — J45901 Unspecified asthma with (acute) exacerbation: Secondary | ICD-10-CM

## 2019-05-29 DIAGNOSIS — Z880 Allergy status to penicillin: Secondary | ICD-10-CM

## 2019-05-29 DIAGNOSIS — Z87891 Personal history of nicotine dependence: Secondary | ICD-10-CM

## 2019-05-29 DIAGNOSIS — Z79899 Other long term (current) drug therapy: Secondary | ICD-10-CM

## 2019-05-29 LAB — URINALYSIS, ROUTINE W REFLEX MICROSCOPIC
Bilirubin Urine: NEGATIVE
Glucose, UA: NEGATIVE mg/dL
Hgb urine dipstick: NEGATIVE
Ketones, ur: NEGATIVE mg/dL
Leukocytes,Ua: NEGATIVE
Nitrite: NEGATIVE
Protein, ur: NEGATIVE mg/dL
Specific Gravity, Urine: 1.011 (ref 1.005–1.030)
pH: 8 (ref 5.0–8.0)

## 2019-05-29 LAB — COMPREHENSIVE METABOLIC PANEL
ALT: 14 U/L (ref 0–44)
AST: 15 U/L (ref 15–41)
Albumin: 3.1 g/dL — ABNORMAL LOW (ref 3.5–5.0)
Alkaline Phosphatase: 47 U/L (ref 38–126)
Anion gap: 7 (ref 5–15)
BUN: <5 mg/dL — ABNORMAL LOW (ref 6–20)
CO2: 19 mmol/L — ABNORMAL LOW (ref 22–32)
Calcium: 8.7 mg/dL — ABNORMAL LOW (ref 8.9–10.3)
Chloride: 108 mmol/L (ref 98–111)
Creatinine, Ser: 0.72 mg/dL (ref 0.44–1.00)
GFR calc Af Amer: >60 mL/min (ref >60)
GFR calc non Af Amer: >60 mL/min (ref >60)
Glucose, Bld: 91 mg/dL (ref 70–99)
Potassium: 3.7 mmol/L (ref 3.5–5.1)
Sodium: 134 mmol/L — ABNORMAL LOW (ref 135–145)
Total Bilirubin: 0.3 mg/dL (ref 0.3–1.2)
Total Protein: 6.4 g/dL — ABNORMAL LOW (ref 6.5–8.1)

## 2019-05-29 LAB — CBC WITH DIFFERENTIAL/PLATELET
Abs Immature Granulocytes: 0.06 10*3/uL (ref 0.00–0.07)
Basophils Absolute: 0 10*3/uL (ref 0.0–0.1)
Basophils Relative: 0 %
Eosinophils Absolute: 0.4 10*3/uL (ref 0.0–0.5)
Eosinophils Relative: 6 %
HCT: 35.2 % — ABNORMAL LOW (ref 36.0–46.0)
Hemoglobin: 11.4 g/dL — ABNORMAL LOW (ref 12.0–15.0)
Immature Granulocytes: 1 %
Lymphocytes Relative: 14 %
Lymphs Abs: 1 10*3/uL (ref 0.7–4.0)
MCH: 28 pg (ref 26.0–34.0)
MCHC: 32.4 g/dL (ref 30.0–36.0)
MCV: 86.5 fL (ref 80.0–100.0)
Monocytes Absolute: 0.7 10*3/uL (ref 0.1–1.0)
Monocytes Relative: 10 %
Neutro Abs: 4.9 10*3/uL (ref 1.7–7.7)
Neutrophils Relative %: 69 %
Platelets: 236 10*3/uL (ref 150–400)
RBC: 4.07 MIL/uL (ref 3.87–5.11)
RDW: 13.8 % (ref 11.5–15.5)
WBC: 7.1 10*3/uL (ref 4.0–10.5)
nRBC: 0 % (ref 0.0–0.2)

## 2019-05-29 LAB — POCT I-STAT 7, (LYTES, BLD GAS, ICA,H+H)
Acid-base deficit: 9 mmol/L — ABNORMAL HIGH (ref 0.0–2.0)
Bicarbonate: 15 mmol/L — ABNORMAL LOW (ref 20.0–28.0)
Calcium, Ion: 1.21 mmol/L (ref 1.15–1.40)
HCT: 33 % — ABNORMAL LOW (ref 36.0–46.0)
Hemoglobin: 11.2 g/dL — ABNORMAL LOW (ref 12.0–15.0)
O2 Saturation: 98 %
Patient temperature: 98.6
Potassium: 3 mmol/L — ABNORMAL LOW (ref 3.5–5.1)
Sodium: 136 mmol/L (ref 135–145)
TCO2: 16 mmol/L — ABNORMAL LOW (ref 22–32)
pCO2 arterial: 25.1 mmHg — ABNORMAL LOW (ref 32.0–48.0)
pH, Arterial: 7.383 (ref 7.350–7.450)
pO2, Arterial: 97 mmHg (ref 83.0–108.0)

## 2019-05-29 LAB — HIV ANTIBODY (ROUTINE TESTING W REFLEX): HIV Screen 4th Generation wRfx: NONREACTIVE

## 2019-05-29 LAB — SARS CORONAVIRUS 2 BY RT PCR (HOSPITAL ORDER, PERFORMED IN ~~LOC~~ HOSPITAL LAB): SARS Coronavirus 2: NEGATIVE

## 2019-05-29 MED ORDER — ALBUTEROL (5 MG/ML) CONTINUOUS INHALATION SOLN
10.0000 mg/h | INHALATION_SOLUTION | Freq: Once | RESPIRATORY_TRACT | Status: AC
  Administered 2019-05-29: 10 mg/h via RESPIRATORY_TRACT
  Filled 2019-05-29: qty 20

## 2019-05-29 MED ORDER — FLUTICASONE FUROATE-VILANTEROL 100-25 MCG/INH IN AEPB
1.0000 | INHALATION_SPRAY | Freq: Every day | RESPIRATORY_TRACT | Status: DC
  Administered 2019-05-31: 1 via RESPIRATORY_TRACT
  Filled 2019-05-29 (×2): qty 28

## 2019-05-29 MED ORDER — ACETAMINOPHEN 650 MG RE SUPP: 650.0000 mg | Freq: Four times a day (QID) | RECTAL | Status: DC | PRN

## 2019-05-29 MED ORDER — FLUTICASONE PROPIONATE 50 MCG/ACT NA SUSP
2.0000 | Freq: Every day | NASAL | Status: DC | PRN
  Filled 2019-05-29: qty 16

## 2019-05-29 MED ORDER — IPRATROPIUM-ALBUTEROL 0.5-2.5 (3) MG/3ML IN SOLN: 3.0000 mL | Freq: Once | RESPIRATORY_TRACT | Status: DC

## 2019-05-29 MED ORDER — BUDESONIDE 0.25 MG/2ML IN SUSP
0.2500 mg | Freq: Once | RESPIRATORY_TRACT | Status: AC
  Administered 2019-05-29: 0.25 mg via RESPIRATORY_TRACT
  Filled 2019-05-29: qty 2

## 2019-05-29 MED ORDER — LACTATED RINGERS IV BOLUS
1000.0000 mL | Freq: Once | INTRAVENOUS | Status: AC
  Administered 2019-05-29: 1000 mL via INTRAVENOUS

## 2019-05-29 MED ORDER — BUDESONIDE 0.5 MG/2ML IN SUSP
2.0000 mg | Freq: Two times a day (BID) | RESPIRATORY_TRACT | Status: DC
  Administered 2019-05-29: 1 mg via RESPIRATORY_TRACT
  Administered 2019-05-30 (×2): 2 mg via RESPIRATORY_TRACT
  Filled 2019-05-29 (×7): qty 8

## 2019-05-29 MED ORDER — GUAIFENESIN 100 MG/5ML PO SOLN
5.0000 mL | ORAL | Status: DC | PRN
  Administered 2019-05-31: 100 mg via ORAL
  Filled 2019-05-29 (×3): qty 5

## 2019-05-29 MED ORDER — ALBUTEROL SULFATE (2.5 MG/3ML) 0.083% IN NEBU
2.5000 mg | INHALATION_SOLUTION | RESPIRATORY_TRACT | Status: DC | PRN
  Administered 2019-05-30: 2.5 mg via RESPIRATORY_TRACT
  Filled 2019-05-29 (×2): qty 3

## 2019-05-29 MED ORDER — BUDESONIDE 0.25 MG/2ML IN SUSP
0.2500 mg | Freq: Two times a day (BID) | RESPIRATORY_TRACT | Status: DC
  Filled 2019-05-29 (×2): qty 2

## 2019-05-29 MED ORDER — BUDESONIDE 0.5 MG/2ML IN SUSP
2.0000 mg | Freq: Four times a day (QID) | RESPIRATORY_TRACT | Status: DC
  Filled 2019-05-29 (×2): qty 8

## 2019-05-29 MED ORDER — POTASSIUM CHLORIDE CRYS ER 20 MEQ PO TBCR
40.0000 meq | EXTENDED_RELEASE_TABLET | Freq: Once | ORAL | Status: AC
  Administered 2019-05-29: 40 meq via ORAL
  Filled 2019-05-29: qty 2

## 2019-05-29 MED ORDER — MAGNESIUM SULFATE 2 GM/50ML IV SOLN
2.0000 g | Freq: Once | INTRAVENOUS | Status: AC
  Administered 2019-05-29: 2 g via INTRAVENOUS
  Filled 2019-05-29: qty 50

## 2019-05-29 MED ORDER — IPRATROPIUM-ALBUTEROL 0.5-2.5 (3) MG/3ML IN SOLN
3.0000 mL | Freq: Four times a day (QID) | RESPIRATORY_TRACT | Status: DC
  Administered 2019-05-29 – 2019-05-31 (×7): 3 mL via RESPIRATORY_TRACT
  Filled 2019-05-29 (×7): qty 3

## 2019-05-29 MED ORDER — PRENATAL MULTIVITAMIN CH
1.0000 | ORAL_TABLET | Freq: Every day | ORAL | Status: DC
  Administered 2019-05-30 – 2019-05-31 (×2): 1 via ORAL
  Filled 2019-05-29 (×3): qty 1

## 2019-05-29 MED ORDER — IPRATROPIUM-ALBUTEROL 0.5-2.5 (3) MG/3ML IN SOLN
3.0000 mL | Freq: Once | RESPIRATORY_TRACT | Status: AC
  Administered 2019-05-29: 3 mL via RESPIRATORY_TRACT
  Filled 2019-05-29: qty 3

## 2019-05-29 MED ORDER — METHYLPREDNISOLONE SODIUM SUCC 125 MG IJ SOLR
80.0000 mg | Freq: Once | INTRAMUSCULAR | Status: AC
  Administered 2019-05-29: 80 mg via INTRAVENOUS
  Filled 2019-05-29: qty 2

## 2019-05-29 MED ORDER — ALBUTEROL SULFATE (2.5 MG/3ML) 0.083% IN NEBU: 2.5000 mg | INHALATION_SOLUTION | Freq: Once | RESPIRATORY_TRACT | Status: DC

## 2019-05-29 MED ORDER — TOPIRAMATE 100 MG PO TABS
100.0000 mg | ORAL_TABLET | Freq: Two times a day (BID) | ORAL | Status: DC
  Administered 2019-05-29 – 2019-05-31 (×4): 100 mg via ORAL
  Filled 2019-05-29 (×4): qty 1

## 2019-05-29 MED ORDER — ACETAMINOPHEN 325 MG PO TABS
650.0000 mg | ORAL_TABLET | Freq: Four times a day (QID) | ORAL | Status: DC | PRN
  Administered 2019-05-30: 650 mg via ORAL
  Filled 2019-05-29: qty 2

## 2019-05-29 NOTE — MAU Provider Note (Addendum)
History     CSN: [id number]  Arrival date and time: 05/29/19 [id number]   First Provider Initiated Contact with Patient 05/29/19 0234      Chief Complaint  Patient presents with  . Asthma   Kathy Flores is a 29 y.o. [phone] at [redacted]w[redacted]d who has not established prenatal care.  She presents today for Cough and SOB.  She states her symptoms started about 6 or 7 pm tonight.  She endorses a history of asthma and states she used her albuterol inhaler with no relief of her symptoms.  She states she used her inhaler between 11pm and 12am.  She states she has not had to use her inhaler much in the past.  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  However, she states that she is not sure if anyone at work is ill.  She endorses fetal movement and denies vaginal discharge, leaking, or bleeding.  She states that she feels "dehydrated a lot." She reports she ate at 330pm and drank fluids at 11pm.   -Albuterol inhaler Exp 07/2020.      OB History    Gravida  4   Para  3   Term  3   Preterm      AB      Living  3     SAB      TAB      Ectopic      Multiple  0   Live Births  3           Past Medical History:  Diagnosis Date  . Asthma    inhaler used 3x month  . Depression   . Fibroid   . Trichomonas infection   . Vaginal Pap smear, abnormal     Past Surgical History:  Procedure Laterality Date  . CESAREAN SECTION N/A 11/23/2013   Procedure: CESAREAN SECTION;  Surgeon: Woodroe Mode, MD;  Location: [facility];  Service: Obstetrics;  Laterality: N/A;    Family History  Problem Relation Age of Onset  . Asthma Sister   . Healthy Mother   . Healthy Father     Social History   Tobacco Use  . Smoking status: Former Research scientist (life sciences)  . Smokeless tobacco: Never Used  Substance Use Topics  . Alcohol use: No  . Drug use: No    Allergies:  Allergies  Allergen Reactions  . Keflet [Cephalexin] Itching  . Penicillins Hives and  Rash    Has patient had a PCN reaction causing immediate rash, facial/tongue/throat swelling, SOB or lightheadedness with hypotension: Yes Has patient had a PCN reaction causing severe rash involving mucus membranes or skin necrosis: No Has patient had a PCN reaction that required hospitalization No Has patient had a PCN reaction occurring within the last 10 years: No If all of the above answers are "NO", then may proceed with Cephalosporin use.     Medications Prior to Admission  Medication Sig Dispense Refill Last Dose  . albuterol (VENTOLIN HFA) 108 (90 Base) MCG/ACT inhaler Inhale 2 puffs into the lungs every 4 (four) hours as needed for wheezing or shortness of breath. 8 g 0 05/29/2019 at Unknown time  . benzonatate (TESSALON) 100 MG capsule Take 1 capsule (100 mg total) by mouth every 8 (eight) hours. 21 capsule 0   . dexamethasone (DECADRON) 4 MG tablet Take 1 tablet (4 mg total) by mouth 2 (two) times daily. 4 tablet 0   . diphenhydrAMINE (  BENADRYL) 25 MG tablet Take 25 mg by mouth every 6 (six) hours as needed.     . fluticasone (FLONASE) 50 MCG/ACT nasal spray Place 2 sprays into both nostrils daily. 11.1 mL 0   . lurasidone (LATUDA) 20 MG TABS tablet Take 20 mg by mouth daily.     . metroNIDAZOLE (FLAGYL) 500 MG tablet Take 1 tablet (500 mg total) by mouth 2 (two) times daily. 14 tablet 0   . Prenatal Vit-Fe Fumarate-FA (PRENATAL VITAMIN) 27-0.8 MG TABS Take 1 tablet by mouth daily. 30 tablet 12   . topiramate (TOPAMAX) 100 MG tablet Take 100 mg by mouth 2 (two) times daily.     . traZODone (DESYREL) 50 MG tablet Take 50 mg by mouth at bedtime.        Review of Systems  Constitutional: Negative for chills and fever.  HENT: Positive for sore throat. Negative for congestion, postnasal drip, rhinorrhea, sinus pressure, sinus pain and sneezing.   Eyes: Negative for visual disturbance.  Respiratory: Positive for cough, chest tightness (With coughing) and shortness of breath.    Cardiovascular: Negative for chest pain.  Gastrointestinal: Negative for abdominal pain, constipation, diarrhea, nausea and vomiting.  Genitourinary: Negative for difficulty urinating, dysuria, vaginal bleeding and vaginal discharge.  Musculoskeletal: Negative for back pain.  Neurological: Positive for light-headedness. Negative for dizziness and headaches.   Physical Exam   Blood pressure (!) 117/56, pulse (!) 102, temperature 99.6 F (37.6 C), temperature source Oral, resp. rate 20, weight 93.8 kg, last menstrual period 09/09/2018, SpO2 97 %.  Physical Exam  Constitutional: She is oriented to person, place, and time. She appears well-developed and well-nourished. No distress.  HENT:  Head: Normocephalic and atraumatic.  Eyes: Conjunctivae are normal.  Cardiovascular: Normal rate, regular rhythm and normal heart sounds.  Respiratory: Effort normal. No accessory muscle usage. She has no decreased breath sounds. She has wheezes. She exhibits no tenderness.  Audible wheezes heard w/o stethoscope usage.   GI: Soft.  Musculoskeletal:        General: Normal range of motion.     Cervical back: Normal range of motion.  Neurological: She is alert and oriented to person, place, and time.  Skin: Skin is warm and dry.  Psychiatric: She has a normal mood and affect. Her behavior is normal.   FHR: 161 by doppler MAU Course  Procedures Results for orders placed or performed during the hospital encounter of 05/29/19 (from the past 24 hour(s))  SARS Coronavirus 2 by RT PCR (hospital order, performed in [facility] hospital lab) Nasopharyngeal Nasopharyngeal Swab     Status: None   Collection Time: 05/29/19  2:55 AM   Specimen: Nasopharyngeal Swab  Result Value Ref Range   SARS Coronavirus 2 NEGATIVE NEGATIVE  Urinalysis, Routine w reflex microscopic     Status: Abnormal   Collection Time: 05/29/19  4:32 AM  Result Value Ref Range   Color, Urine YELLOW YELLOW   APPearance HAZY (A) CLEAR    Specific Gravity, Urine 1.011 1.005 - 1.030   pH 8.0 5.0 - 8.0   Glucose, UA NEGATIVE NEGATIVE mg/dL   Hgb urine dipstick NEGATIVE NEGATIVE   Bilirubin Urine NEGATIVE NEGATIVE   Ketones, ur NEGATIVE NEGATIVE mg/dL   Protein, ur NEGATIVE NEGATIVE mg/dL   Nitrite NEGATIVE NEGATIVE   Leukocytes,Ua NEGATIVE NEGATIVE  CBC with Differential/Platelet     Status: Abnormal   Collection Time: 05/29/19  5:00 AM  Result Value Ref Range   WBC 7.1 4.0 - 10.5  K/uL   RBC 4.07 3.87 - 5.11 MIL/uL   Hemoglobin 11.4 (L) 12.0 - 15.0 g/dL   HCT 35.2 (L) 36.0 - 46.0 %   MCV 86.5 80.0 - 100.0 fL   MCH 28.0 26.0 - 34.0 pg   MCHC 32.4 30.0 - 36.0 g/dL   RDW 13.8 11.5 - 15.5 %   Platelets 236 150 - 400 K/uL   nRBC 0.0 0.0 - 0.2 %   Neutrophils Relative % 69 %   Neutro Abs 4.9 1.7 - 7.7 K/uL   Lymphocytes Relative 14 %   Lymphs Abs 1.0 0.7 - 4.0 K/uL   Monocytes Relative 10 %   Monocytes Absolute 0.7 0.1 - 1.0 K/uL   Eosinophils Relative 6 %   Eosinophils Absolute 0.4 0.0 - 0.5 K/uL   Basophils Relative 0 %   Basophils Absolute 0.0 0.0 - 0.1 K/uL   Immature Granulocytes 1 %   Abs Immature Granulocytes 0.06 0.00 - 0.07 K/uL   No results found.  MDM Physical Exam Labs:Covid, CBC with Diff, CMP, UA Start IV with LR Infusion Nebulizer Treatment Assessment and Plan  29 year old [id number] SIUP at 21.4 weeks SOB, Cough H/O Asthma  -RT called prior to provider going to bedside and reports nebulizer treatment can be given at bedside with usage of N-95 since patient having Covid symptoms.  -Physical Exam completed -POC reviewed with patient. -Patient informed that due to lack of pregnancy complaints usual protocol would be to transfer to [facility]. -However due to low acuity on unit, provider will perform Covid testing and get nebulizer treatment. -Will order pulmicort as albuterol has been ineffective.  -Will start IV and given LR infusion -Labs to be collected -UA to be obtained. -Will monitor and  reassess.   Maryann Conners 05/29/2019, 2:34 AM   Reassessment (3:45 AM) -RT calls and states that since patient experiencing COVID-like symptoms and Covid pending, unable to do nebulizer treatment. -However, CoVid returns negative.  Nurse instructed to call RT and inform so that nebulizer treatment can be given.  -Will reassess after treatment.  Reassessment (6:05 AM) -Nurse call reports patient nebulizer treatment complete and patient with continued c/o SOB. -Provider to bedside and patient exam unchanged as wheezes heard throughout. -Patient given option of being transferred to [facility] and agreeable.  -[facility] provider contacted and updated on patient status and accepts transfer. -Nurse informed of POC. -Patient encouraged to call or return to MAU with onset of pregnancy symptoms. -Patient should initiate [place].  Maryann Conners MSN, CNM Advanced Practice Provider, [facility]

## 2019-05-29 NOTE — ED Notes (Signed)
Pts oxygen saturation remained at 99-100 % while ambulating in the room. No complaints of dizziness, dyspnea, or lightheadedness.

## 2019-05-29 NOTE — Progress Notes (Signed)
   Pt arrived to 6N20 from ED. Report received from [place], [place]. Pt alert and oriented x4. See assessment. Will continue to monitor pt.

## 2019-05-29 NOTE — MAU Note (Addendum)
Pt states she feels dehydrated, lighted headed and has a cough that started today, pt states she "has felt this way for a while". Pt states she has asthma and her inhaler is not working. Reports pelvic pressure since this morning. Denies VB/LOF +movement.

## 2019-05-29 NOTE — ED Notes (Signed)
Pts oxygen saturation remained between 99-100% while ambulating in the hallway with no complaints.

## 2019-05-29 NOTE — ED Provider Notes (Signed)
[facility] EMERGENCY DEPARTMENT Provider Note   CSN: [id number] Arrival date & time: 05/29/19  0121   History Chief Complaint  Patient presents with  . Asthma    Kathy Flores is a 29 y.o. G4P3 female who is approximately [redacted]w[redacted]d with history of asthma who presents with SOB and wheezing. She states the SOB started yesterday. She states that she also feels hot and cold and feels congested. She denies headache, anosmia, chest pain, abdominal/pelvic pain, N/V, leg swelling. She feels that this is not typical for an asthma exacerbation. [facility] and had a COVID test which was negative. She had a neb treatment but did not feel better and since she was not having any pregnancy related complaints she was transferred here to the ED.    HPI   Past Medical History:  Diagnosis Date  . Asthma    inhaler used 3x month  . Depression   . Fibroid   . Trichomonas infection   . Vaginal Pap smear, abnormal     Patient Active Problem List   Diagnosis Date Noted  . Low-lying placenta 05/26/2019  . Asthma exacerbation   . Metabolic acidosis   . Acute respiratory failure with hypoxemia ([place]) 09/23/2015  . SIRS (systemic inflammatory response syndrome) ([place]) 09/23/2015  . Normochromic normocytic anemia 09/23/2015  . Lactic acidosis   . Encounter for observation of infant for suspected infection   . Post term pregnancy, 41 weeks 11/22/2013  . Post-term pregnancy, 40-42 weeks of gestation   . [redacted] weeks gestation of pregnancy     Past Surgical History:  Procedure Laterality Date  . CESAREAN SECTION N/A 11/23/2013   Procedure: CESAREAN SECTION;  Surgeon: Woodroe Mode, MD;  Location: [facility];  Service: Obstetrics;  Laterality: N/A;     OB History    Gravida  4   Para  3   Term  3   Preterm      AB      Living  3     SAB      TAB      Ectopic      Multiple  0   Live Births  3           Family History  Problem Relation Age of Onset  .  Asthma Sister   . Healthy Mother   . Healthy Father     Social History   Tobacco Use  . Smoking status: Former Research scientist (life sciences)  . Smokeless tobacco: Never Used  Substance Use Topics  . Alcohol use: No  . Drug use: No    Home Medications Prior to Admission medications   Medication Sig Start Date End Date Taking? Authorizing Provider  albuterol (VENTOLIN HFA) 108 (90 Base) MCG/ACT inhaler Inhale 2 puffs into the lungs every 4 (four) hours as needed for wheezing or shortness of breath. 04/16/19  Yes Henderly, Britni A, PA-C  benzonatate (TESSALON) 100 MG capsule Take 1 capsule (100 mg total) by mouth every 8 (eight) hours. 04/16/19   Henderly, Britni A, PA-C  dexamethasone (DECADRON) 4 MG tablet Take 1 tablet (4 mg total) by mouth 2 (two) times daily. 11/09/18   Virgel Manifold, MD  diphenhydrAMINE (BENADRYL) 25 MG tablet Take 25 mg by mouth every 6 (six) hours as needed.    [provider]  fluticasone (FLONASE) 50 MCG/ACT nasal spray Place 2 sprays into both nostrils daily. 04/16/19   Henderly, Britni A, PA-C  lurasidone (LATUDA) 20 MG TABS  tablet Take 20 mg by mouth daily. 03/24/18   [provider]  metroNIDAZOLE (FLAGYL) 500 MG tablet Take 1 tablet (500 mg total) by mouth 2 (two) times daily. 05/26/18   Kendrick, Caitlyn S, PA-C  Prenatal Vit-Fe Fumarate-FA (PRENATAL VITAMIN) 27-0.8 MG TABS Take 1 tablet by mouth daily. 05/26/19   Seabron Spates, CNM  topiramate (TOPAMAX) 100 MG tablet Take 100 mg by mouth 2 (two) times daily.    [provider]  traZODone (DESYREL) 50 MG tablet Take 50 mg by mouth at bedtime.     [provider]    Allergies    Keflet [cephalexin] and Penicillins  Review of Systems   Review of Systems  Respiratory: Positive for shortness of breath and wheezing.   Cardiovascular: Negative for chest pain, palpitations and leg swelling.  Gastrointestinal: Negative for abdominal pain, nausea and vomiting.  All other systems reviewed and are  negative.   Physical Exam Updated Vital Signs BP (!) 117/56 (BP Location: Right Arm)   Pulse (!) 102   Temp 99.6 F (37.6 C) (Oral)   Resp 20   Wt 93.8 kg   LMP 09/09/2018 (LMP Unknown) Comment: SHIELDED  SpO2 99%   BMI 37.82 kg/m   Physical Exam Vitals and nursing note reviewed.  Constitutional:      General: She is not in acute distress.    Appearance: Normal appearance. She is well-developed. She is not ill-appearing.  HENT:     Head: Normocephalic and atraumatic.     Right Ear: Tympanic membrane normal.     Left Ear: Tympanic membrane normal.     Nose: Nose normal.     Mouth/Throat:     Mouth: Mucous membranes are moist.  Eyes:     General: No scleral icterus.       Right eye: No discharge.        Left eye: No discharge.     Conjunctiva/sclera: Conjunctivae normal.     Pupils: Pupils are equal, round, and reactive to light.  Cardiovascular:     Rate and Rhythm: Normal rate and regular rhythm.  Pulmonary:     Effort: Pulmonary effort is normal. No respiratory distress.     Breath sounds: Wheezing present.  Abdominal:     General: There is no distension.     Palpations: Abdomen is soft.     Tenderness: There is no abdominal tenderness.  Musculoskeletal:     Cervical back: Normal range of motion.  Skin:    General: Skin is warm and dry.  Neurological:     Mental Status: She is alert and oriented to person, place, and time.  Psychiatric:        Behavior: Behavior normal.     ED Results / Procedures / Treatments   Labs (all labs ordered are listed, but only abnormal results are displayed) Labs Reviewed  COMPREHENSIVE METABOLIC PANEL - Abnormal; Notable for the following components:      Result Value   Sodium 134 (*)    CO2 19 (*)    BUN <5 (*)    Calcium 8.7 (*)    Total Protein 6.4 (*)    Albumin 3.1 (*)    All other components within normal limits  CBC WITH DIFFERENTIAL/PLATELET - Abnormal; Notable for the following components:   Hemoglobin 11.4  (*)    HCT 35.2 (*)    All other components within normal limits  URINALYSIS, ROUTINE W REFLEX MICROSCOPIC - Abnormal; Notable for the following components:  APPearance HAZY (*)    All other components within normal limits  SARS CORONAVIRUS 2 BY RT PCR (HOSPITAL ORDER, [facility])    EKG None  Radiology No results found.  Procedures Procedures (including critical care time)  CRITICAL CARE Performed by: Recardo Evangelist   Total critical care time: 35 minutes  Critical care time was exclusive of separately billable procedures and treating other patients.  Critical care was necessary to treat or prevent imminent or life-threatening deterioration.  Critical care was time spent personally by me on the following activities: development of treatment plan with patient and/or surrogate as well as nursing, discussions with consultants, evaluation of patient's response to treatment, examination of patient, obtaining history from patient or surrogate, ordering and performing treatments and interventions, ordering and review of laboratory studies, ordering and review of radiographic studies, pulse oximetry and re-evaluation of patient's condition.   Medications Ordered in ED Medications  topiramate (TOPAMAX) tablet 100 mg (has no administration in time range)  Prenatal Vitamin 27-0.8 MG TABS 1 tablet (has no administration in time range)  fluticasone (FLONASE) 50 MCG/ACT nasal spray 2 spray (has no administration in time range)  acetaminophen (TYLENOL) tablet 650 mg (has no administration in time range)    Or  acetaminophen (TYLENOL) suppository 650 mg (has no administration in time range)  fluticasone furoate-vilanterol (BREO ELLIPTA) 100-25 MCG/INH 1 puff (has no administration in time range)  budesonide (PULMICORT) nebulizer solution 2 mg (has no administration in time range)  ipratropium-albuterol (DUONEB) 0.5-2.5 (3) MG/3ML nebulizer solution 3 mL (has no  administration in time range)  albuterol (PROVENTIL) (2.5 MG/3ML) 0.083% nebulizer solution 2.5 mg (has no administration in time range)  magnesium sulfate IVPB 2 g 50 mL (has no administration in time range)  lactated ringers bolus 1,000 mL (1,000 mLs Intravenous New Bag/Given 05/29/19 0509)  budesonide (PULMICORT) nebulizer solution 0.25 mg (0.25 mg Nebulization Given 05/29/19 0428)  methylPREDNISolone sodium succinate (SOLU-MEDROL) 125 mg/2 mL injection 80 mg (80 mg Intravenous Given 05/29/19 0831)  ipratropium-albuterol (DUONEB) 0.5-2.5 (3) MG/3ML nebulizer solution 3 mL (3 mLs Nebulization Given 05/29/19 0841)  ipratropium-albuterol (DUONEB) 0.5-2.5 (3) MG/3ML nebulizer solution 3 mL (3 mLs Nebulization Given 05/29/19 1008)  albuterol (PROVENTIL,VENTOLIN) solution continuous neb (10 mg/hr Nebulization Given 05/29/19 1120)    ED Course  I have reviewed the triage vital signs and the nursing notes.  Pertinent labs & imaging results that were available during my care of the patient were reviewed by me and considered in my medical decision making (see chart for details).  29 year old female presents with SOB, wheezing, cough. She feels it's different from prior asthma exacerbations. Vitals are normal. HENT exam is normal. Lung exam is remarkable for diffuse expiratory wheezes. She had one breathing tx [facility] without relief. Will give duoneb here and solu-medrol.  Rechecked pt. She is still wheezing. Will add duoneb.   11:10 AM Rechecked pt after 3rd neb. She states that her breathing does not feel significantly improved. Will obtain CXR and give CAT.  CXR is negative. Pt is still feeling SOB and is wheezing on exam. Will admit for further management. Discussed with Dr. Roosevelt Locks who will admit.   MDM Rules/Calculators/A&P                       Final Clinical Impression(s) / ED Diagnoses Final diagnoses:  Moderate persistent asthma with exacerbation    Rx / DC Orders ED Discharge Orders  None       Recardo Evangelist, PA-C 05/29/19 [place], MD 06/01/19 [phone]

## 2019-05-29 NOTE — H&P (Signed)
History and Physical    Kathy Flores DOB: 03-Sep-1990 DOA: 05/29/2019  PCP: Patient, No Pcp Per (Confirm with patient/family/NH records and if not entered, this has to be entered at [facility] point of entry) Patient coming from: Home  I have personally briefly reviewed patient's old medical records in [place]  Chief Complaint: SOB  HPI: Kathy Flores is a 29 y.o. female with medical history significant of current 21 wks pregnancy, seasonal asthma, anxiety, presented with worsening of asthma syptoms. Her symptoms started yesterday with increasing wheezing and short of breath. She states that she also feels hot and cold and feels congested. She denies headache, anosmia, chest pain, abdominal/pelvic pain, N/V, leg swelling. She feels that this is not typical for an asthma exacerbation. [facility] and had a COVID test which was negative. She had a neb treatment but did not feel better and since she was not having any pregnancy related complaints she was transferred here to the ED.  ED Course: Remains very labored breathing after multiple breathing treatments.  Review of Systems: As per HPI otherwise 10 point review of systems negative.    Past Medical History:  Diagnosis Date  . Asthma    inhaler used 3x month  . Depression   . Fibroid   . Trichomonas infection   . Vaginal Pap smear, abnormal     Past Surgical History:  Procedure Laterality Date  . CESAREAN SECTION N/A 11/23/2013   Procedure: CESAREAN SECTION;  Surgeon: Woodroe Mode, MD;  Location: [facility];  Service: Obstetrics;  Laterality: N/A;     reports that she has quit smoking. She has never used smokeless tobacco. She reports that she does not drink alcohol or use drugs.  Allergies  Allergen Reactions  . Keflet [Cephalexin] Itching  . Penicillins Hives and Rash    Has patient had a PCN reaction causing immediate rash, facial/tongue/throat swelling, SOB or lightheadedness with hypotension:  Yes Has patient had a PCN reaction causing severe rash involving mucus membranes or skin necrosis: No Has patient had a PCN reaction that required hospitalization No Has patient had a PCN reaction occurring within the last 10 years: No If all of the above answers are "NO", then may proceed with Cephalosporin use.     Family History  Problem Relation Age of Onset  . Asthma Sister   . Healthy Mother   . Healthy Father     Prior to Admission medications   Medication Sig Start Date End Date Taking? Authorizing Provider  albuterol (VENTOLIN HFA) 108 (90 Base) MCG/ACT inhaler Inhale 2 puffs into the lungs every 4 (four) hours as needed for wheezing or shortness of breath. 04/16/19  Yes Henderly, Britni A, PA-C  benzonatate (TESSALON) 100 MG capsule Take 1 capsule (100 mg total) by mouth every 8 (eight) hours. 04/16/19   Henderly, Britni A, PA-C  dexamethasone (DECADRON) 4 MG tablet Take 1 tablet (4 mg total) by mouth 2 (two) times daily. 11/09/18   Virgel Manifold, MD  diphenhydrAMINE (BENADRYL) 25 MG tablet Take 25 mg by mouth every 6 (six) hours as needed.    [provider]  fluticasone (FLONASE) 50 MCG/ACT nasal spray Place 2 sprays into both nostrils daily. 04/16/19   Henderly, Britni A, PA-C  lurasidone (LATUDA) 20 MG TABS tablet Take 20 mg by mouth daily. 03/24/18   [provider]  metroNIDAZOLE (FLAGYL) 500 MG tablet Take 1 tablet (500 mg total) by mouth 2 (two) times daily.  05/26/18   Kendrick, Caitlyn S, PA-C  Prenatal Vit-Fe Fumarate-FA (PRENATAL VITAMIN) 27-0.8 MG TABS Take 1 tablet by mouth daily. 05/26/19   Seabron Spates, CNM  topiramate (TOPAMAX) 100 MG tablet Take 100 mg by mouth 2 (two) times daily.    [provider]  traZODone (DESYREL) 50 MG tablet Take 50 mg by mouth at bedtime.     [provider]    Physical Exam: Vitals:   05/29/19 1300 05/29/19 1315 05/29/19 1330 05/29/19 1415  BP:    115/63  Pulse: (!) 115 (!) 117 (!) 116 (!) 114    Resp: (!) 25 (!) 22 18 (!) 24  Temp:      TempSrc:      SpO2: 96% 96% 99% 98%  Weight:        Constitutional: NAD, calm, comfortable Vitals:   05/29/19 1300 05/29/19 1315 05/29/19 1330 05/29/19 1415  BP:    115/63  Pulse: (!) 115 (!) 117 (!) 116 (!) 114  Resp: (!) 25 (!) 22 18 (!) 24  Temp:      TempSrc:      SpO2: 96% 96% 99% 98%  Weight:       Eyes: PERRL, lids and conjunctivae normal ENMT: Mucous membranes are moist. Posterior pharynx clear of any exudate or lesions.Normal dentition.  Neck: normal, supple, no masses, no thyromegaly Respiratory: Diminished breathing sound bilaterally, diffused wheezing, no crackles.  Increased respiratory effort, talking in broken sentences.  Positive signs of using accessory muscle.  Cardiovascular: Regular rate and rhythm, no murmurs / rubs / gallops. No extremity edema. 2+ pedal pulses. No carotid bruits.  Abdomen: no tenderness, no masses palpated. No hepatosplenomegaly. Bowel sounds positive.  Musculoskeletal: no clubbing / cyanosis. No joint deformity upper and lower extremities. Good ROM, no contractures. Normal muscle tone.  Skin: no rashes, lesions, ulcers. No induration Neurologic: CN 2-12 grossly intact. Sensation intact, DTR normal. Strength 5/5 in all 4.  Psychiatric: Normal judgment and insight. Alert and oriented x 3. Normal mood.     Labs on Admission: I have personally reviewed following labs and imaging studies  CBC: Recent Labs  Lab 05/25/19 2146 05/29/19 0500 05/29/19 1402  WBC 12.2* 7.1  --   NEUTROABS  --  4.9  --   HGB 11.2* 11.4* 11.2*  HCT 34.4* 35.2* 33.0*  MCV 85.4 86.5  --   PLT 236 236  --    Basic Metabolic Panel: Recent Labs  Lab 05/29/19 0500 05/29/19 1402  NA 134* 136  K 3.7 3.0*  CL 108  --   CO2 19*  --   GLUCOSE 91  --   BUN <5*  --   CREATININE 0.72  --   CALCIUM 8.7*  --    GFR: Estimated Creatinine Clearance: 110.7 mL/min (by C-G formula based on SCr of 0.72 mg/dL). Liver  Function Tests: Recent Labs  Lab 05/29/19 0500  AST 15  ALT 14  ALKPHOS 47  BILITOT 0.3  PROT 6.4*  ALBUMIN 3.1*   No results for input(s): LIPASE, AMYLASE in the last 168 hours. No results for input(s): AMMONIA in the last 168 hours. Coagulation Profile: No results for input(s): INR, PROTIME in the last 168 hours. Cardiac Enzymes: No results for input(s): CKTOTAL, CKMB, CKMBINDEX, TROPONINI in the last 168 hours. BNP (last 3 results) No results for input(s): PROBNP in the last 8760 hours. HbA1C: No results for input(s): HGBA1C in the last 72 hours. CBG: No results for input(s): GLUCAP in the  last 168 hours. Lipid Profile: No results for input(s): CHOL, HDL, LDLCALC, TRIG, CHOLHDL, LDLDIRECT in the last 72 hours. Thyroid Function Tests: No results for input(s): TSH, T4TOTAL, FREET4, T3FREE, THYROIDAB in the last 72 hours. Anemia Panel: No results for input(s): VITAMINB12, FOLATE, FERRITIN, TIBC, IRON, RETICCTPCT in the last 72 hours. Urine analysis:    Component Value Date/Time   COLORURINE YELLOW 05/29/2019 0432   APPEARANCEUR HAZY (A) 05/29/2019 0432   LABSPEC 1.011 05/29/2019 0432   PHURINE 8.0 05/29/2019 0432   GLUCOSEU NEGATIVE 05/29/2019 0432   HGBUR NEGATIVE 05/29/2019 0432   BILIRUBINUR NEGATIVE 05/29/2019 0432   KETONESUR NEGATIVE 05/29/2019 0432   PROTEINUR NEGATIVE 05/29/2019 0432   UROBILINOGEN 0.2 10/28/2015 1842   NITRITE NEGATIVE 05/29/2019 0432   LEUKOCYTESUR NEGATIVE 05/29/2019 0432    Radiological Exams on Admission: DG Chest Portable 1 View  Result Date: 05/29/2019 CLINICAL DATA:  Redness of breath for a week. Wheezing. History of asthma. Five months pregnant. EXAM: PORTABLE CHEST 1 VIEW COMPARISON:  November 09, 2018 FINDINGS: Mild atelectasis in the right base. The heart, hila, mediastinum, lungs, and pleura are normal. No acute abnormalities. IMPRESSION: No active disease. Electronically Signed   By: Dorise Bullion III M.D   On: 05/29/2019 12:01     EKG: Independently reviewed.  Sinus tachycardia  Assessment/Plan Active Problems:   Asthma attacks lasting more than 24 hours   Asthma  (please populate well all problems here in Problem List. (For example, if patient is on BP meds at home and you resume or decide to hold them, it is a problem that needs to be her. Same for CAD, COPD, HLD and so on)  Acute asthma exacerbation -SABA, SAMA, inhaled steroid, hold off antibiotics -IV steroid x1 in ED, will avoid p.o. steroid given her pregnancy status -1 dose of magnesium  Metabolic acidosis -Unknown etiology, looks like a chronic, will check ABG to rule out CO2 retention -BiPAP if CO2 level high  Hypokalemia -Replace and recheck  21 weeks pregnancy -No acute issue, continue multivitamins, outpatient OB follow-up  DVT prophylaxis: SCD Code Status: Full Family Communication: None at bedside Disposition Plan: Likely can be discharged in next 24 hours once asthma symptoms improved Consults called: None Admission status: MedSurg observation   Lequita Halt MD [facility] Pager [phone]    05/29/2019, 2:38 PM

## 2019-05-29 NOTE — ED Notes (Signed)
Pt placed on cardiac monitor while on continuous neb. Provided with food/drink for PO challenge.

## 2019-05-29 NOTE — ED Notes (Signed)
Report attempted, 6N unable to take at this time 

## 2019-05-29 NOTE — ED Notes (Signed)
Patient transferred from [facility] with c/o sob states her inhalers and breathing treatments aren't working. C/o occ. Cough. States her sob increases when she gets up and walks around.

## 2019-05-30 DIAGNOSIS — Z880 Allergy status to penicillin: Secondary | ICD-10-CM | POA: Diagnosis not present

## 2019-05-30 DIAGNOSIS — O0932 Supervision of pregnancy with insufficient antenatal care, second trimester: Secondary | ICD-10-CM | POA: Diagnosis not present

## 2019-05-30 DIAGNOSIS — Z3A29 29 weeks gestation of pregnancy: Secondary | ICD-10-CM | POA: Diagnosis not present

## 2019-05-30 DIAGNOSIS — O99513 Diseases of the respiratory system complicating pregnancy, third trimester: Secondary | ICD-10-CM | POA: Diagnosis not present

## 2019-05-30 DIAGNOSIS — J45909 Unspecified asthma, uncomplicated: Secondary | ICD-10-CM | POA: Diagnosis not present

## 2019-05-30 DIAGNOSIS — E876 Hypokalemia: Secondary | ICD-10-CM | POA: Diagnosis present

## 2019-05-30 DIAGNOSIS — O99282 Endocrine, nutritional and metabolic diseases complicating pregnancy, second trimester: Secondary | ICD-10-CM | POA: Diagnosis present

## 2019-05-30 DIAGNOSIS — Z79899 Other long term (current) drug therapy: Secondary | ICD-10-CM | POA: Diagnosis not present

## 2019-05-30 DIAGNOSIS — E872 Acidosis: Secondary | ICD-10-CM | POA: Diagnosis present

## 2019-05-30 DIAGNOSIS — J4541 Moderate persistent asthma with (acute) exacerbation: Secondary | ICD-10-CM | POA: Diagnosis not present

## 2019-05-30 DIAGNOSIS — O99512 Diseases of the respiratory system complicating pregnancy, second trimester: Secondary | ICD-10-CM | POA: Diagnosis not present

## 2019-05-30 DIAGNOSIS — Z87891 Personal history of nicotine dependence: Secondary | ICD-10-CM | POA: Diagnosis not present

## 2019-05-30 DIAGNOSIS — Z825 Family history of asthma and other chronic lower respiratory diseases: Secondary | ICD-10-CM | POA: Diagnosis not present

## 2019-05-30 DIAGNOSIS — Z3A21 21 weeks gestation of pregnancy: Secondary | ICD-10-CM | POA: Diagnosis not present

## 2019-05-30 DIAGNOSIS — Z20822 Contact with and (suspected) exposure to covid-19: Secondary | ICD-10-CM | POA: Diagnosis present

## 2019-05-30 LAB — CBC
HCT: 33.8 % — ABNORMAL LOW (ref 36.0–46.0)
Hemoglobin: 10.9 g/dL — ABNORMAL LOW (ref 12.0–15.0)
MCH: 27.7 pg (ref 26.0–34.0)
MCHC: 32.2 g/dL (ref 30.0–36.0)
MCV: 85.8 fL (ref 80.0–100.0)
Platelets: 241 10*3/uL (ref 150–400)
RBC: 3.94 MIL/uL (ref 3.87–5.11)
RDW: 14.1 % (ref 11.5–15.5)
WBC: 7.6 10*3/uL (ref 4.0–10.5)
nRBC: 0 % (ref 0.0–0.2)

## 2019-05-30 LAB — BASIC METABOLIC PANEL
Anion gap: 11 (ref 5–15)
BUN: <5 mg/dL — ABNORMAL LOW (ref 6–20)
CO2: 17 mmol/L — ABNORMAL LOW (ref 22–32)
Calcium: 8.7 mg/dL — ABNORMAL LOW (ref 8.9–10.3)
Chloride: 108 mmol/L (ref 98–111)
Creatinine, Ser: 0.63 mg/dL (ref 0.44–1.00)
GFR calc Af Amer: >60 mL/min (ref >60)
GFR calc non Af Amer: >60 mL/min (ref >60)
Glucose, Bld: 100 mg/dL — ABNORMAL HIGH (ref 70–99)
Potassium: 3.5 mmol/L (ref 3.5–5.1)
Sodium: 136 mmol/L (ref 135–145)

## 2019-05-30 MED ORDER — ENSURE ENLIVE PO LIQD
237.0000 mL | Freq: Three times a day (TID) | ORAL | Status: DC
  Administered 2019-05-30: 237 mL via ORAL

## 2019-05-30 MED ORDER — PREDNISONE 20 MG PO TABS
20.0000 mg | ORAL_TABLET | Freq: Every day | ORAL | Status: DC
  Administered 2019-05-30 – 2019-05-31 (×2): 20 mg via ORAL
  Filled 2019-05-30 (×2): qty 1

## 2019-05-30 NOTE — TOC Initial Note (Signed)
Transition of Care [facility]) - Initial/Assessment Note    Patient Details  Name: Kathy Flores MRN: [id number] Date of Birth: 1990-08-04  Transition of Care [facility]) CM/SW Contact:    Marilu Favre, RN Phone Number: 05/30/2019, 4:17 PM  Clinical Narrative:                  Patient from home. Needs PCP. Will call [facility] to schedule an appointment.   Explained [place] letter and [facility] pharmacy to patient. Patient voiced agreement and understanding . Expected Discharge Plan: Home/Self Care     Patient Goals and CMS Choice Patient states their goals for this hospitalization and ongoing recovery are:: to return to home CMS Medicare.gov Compare Post Acute Care list provided to:: Patient Choice offered to / list presented to : Patient  Expected Discharge Plan and Services Expected Discharge Plan: Home/Self Care In-house Referral: Development worker, community, PCP / [facility] Discharge Planning Services: CM Consult, [facility], [facility], Medication Assistance   Living arrangements for the past 2 months: Single Family Home                 DME Arranged: N/A DME Agency: NA       HH Arranged: NA          Prior Living Arrangements/Services Living arrangements for the past 2 months: Single Family Home Lives with:: Self Patient language and need for interpreter reviewed:: Yes Do you feel safe going back to the place where you live?: Yes      Need for Family Participation in Patient Care: Yes (Comment) Care giver support system in place?: Yes (comment)   Criminal Activity/Legal Involvement Pertinent to Current Situation/Hospitalization: No - Comment as needed  Activities of Daily Living Home Assistive Devices/Equipment: Eyeglasses ADL Screening (condition at time of admission) Patient's cognitive ability adequate to safely complete daily activities?: Yes Is the patient deaf or have difficulty hearing?: No Does the patient have difficulty  seeing, even when wearing glasses/contacts?: No Does the patient have difficulty concentrating, remembering, or making decisions?: No Patient able to express need for assistance with ADLs?: Yes Does the patient have difficulty dressing or bathing?: No Independently performs ADLs?: Yes (appropriate for developmental age) Does the patient have difficulty walking or climbing stairs?: No Weakness of Legs: None Weakness of Arms/Hands: None  Permission Sought/Granted   Permission granted to share information with : No              Emotional Assessment Appearance:: Appears stated age Attitude/Demeanor/Rapport: Engaged Affect (typically observed): Accepting Orientation: : Oriented to Place, Oriented to Self, Oriented to  Time, Oriented to Situation Alcohol / Substance Use: Not Applicable Psych Involvement: No (comment)  Admission diagnosis:  Moderate persistent asthma with exacerbation [J45.41] Asthma [J45.909] Patient Active Problem List   Diagnosis Date Noted  . Asthma 05/29/2019  . Moderate persistent asthma with exacerbation   . Low-lying placenta 05/26/2019  . Asthma attacks lasting more than 24 hours   . Metabolic acidosis   . Acute respiratory failure with hypoxemia ([place]) 09/23/2015  . SIRS (systemic inflammatory response syndrome) ([place]) 09/23/2015  . Normochromic normocytic anemia 09/23/2015  . Lactic acidosis   . Encounter for observation of infant for suspected infection   . Post term pregnancy, 41 weeks 11/22/2013  . Post-term pregnancy, 40-42 weeks of gestation   . [redacted] weeks gestation of pregnancy    PCP:  Patient, No Pcp Per Pharmacy:   [place] -  [address] [place] ([place]) [place] [address] Phone: [phone] Fax: [place], [place] - [address] [place] [address] Phone: [phone] Fax:  [phone]     Social Determinants of Health (SDOH) Interventions    Readmission Risk Interventions No flowsheet data found.

## 2019-05-30 NOTE — Progress Notes (Signed)
PROGRESS NOTE  Kathy Flores  [id number] DOB: 01/12/1990 DOA: 05/29/2019 PCP: Patient, No Pcp Per   Chief Complaint  Patient presents with  . Asthma    Brief Narrative:  32 grandmultipara [id number], Prior C section Prior Trich  admit with asthma exacerbation 5/24 Didn't know she was pregnant--has been to multiple exposures to Medical care since March and was told "wasn't pregnant"   Assessment & Plan:   Active Problems:   Asthma attacks lasting more than 24 hours   Asthma  asthma 1. Hesitate to use systemic steroids-discussed with Dr. Roselie Awkward who will be seeing who states that this should be a reasonable therapeutic option (I had shared decision making with the patient and discussed risk of cleft palate, IUGR in layman's terms with her close}-we will reevaluate the patient a little later and if she is still having wheeze we will start low-dose prednisone 20 mg today Metabolic acidosis unclear etiology dating back to 08/04/2017- Anion gap is not elevated 1. Etiology unclear could be RTA? 2. Obtain nonemergent blood gas tomorrow morning with labs Gu-Win multiparity, high risk pregnancy 1. Obstetrician Dr. Roselie Awkward aware of patient will see-if stays beyond tomorrow we will discuss with him regarding transferring to his service given our hospitalist-OB/GYN agreement   DVT prophylaxis:  Code Status:  Family Communication: Disposition:   Status is: Observation  The patient will require care spanning > 2 midnights and should be moved to inpatient because: Persistent severe electrolyte disturbances, IV treatments appropriate due to intensity of illness or inability to take PO and Inpatient level of care appropriate due to severity of illness  Dispo: The patient is from: Home              Anticipated d/c is to: Home              Anticipated d/c date is: 2 days              Patient currently is not medically stable to d/c.       Consultants:   OB/GYN  Procedures:  None  Antimicrobials: No   Subjective: Wheezing quite severely when she laughs otherwise seems fine Relates to me the story of her being tested multiple times with negative pregnancy tests resulting in her being unclear that she was pregnant  Objective: Vitals:   05/30/19 0030 05/30/19 0418 05/30/19 0431 05/30/19 0830  BP: 103/60  110/83 111/62  Pulse: 67  82 73  Resp: 18  16 16   Temp: 98.9 F (37.2 C)  98.7 F (37.1 C) 97.8 F (36.6 C)  TempSrc: Oral  Oral Oral  SpO2: 97% 98% 100% 95%  Weight:        Intake/Output Summary (Last 24 hours) at 05/30/2019 1406 Last data filed at 05/30/2019 [id number] Gross per 24 hour  Intake 1240 ml  Output --  Net 1240 ml   Filed Weights   05/29/19 0130  Weight: 93.8 kg    Examination:  General exam: Awake obese pleasant African-American female no distress Respiratory system: Coarse bilateral wheezes crackles Cardiovascular system: S1-S2 no murmur Gastrointestinal system: Soft nontender no rebound. Central nervous system: Neurologically intact no focal deficit Extremities: Soft Skin: No excoriations Psychiatry: Pleasant    Data Reviewed: I have personally reviewed following labs and imaging studies Bicarb 17 down from 19 BUN/creatinine 5/0.6 White count 7.6 hemoglobin 10.9 platelet 241  Radiology Studies: DG Chest Portable 1 View  Result Date: 05/29/2019 CLINICAL DATA:  Redness of breath for a week. Wheezing. History of  asthma. Five months pregnant. EXAM: PORTABLE CHEST 1 VIEW COMPARISON:  November 09, 2018 FINDINGS: Mild atelectasis in the right base. The heart, hila, mediastinum, lungs, and pleura are normal. No acute abnormalities. IMPRESSION: No active disease. Electronically Signed   By: Dorise Bullion III M.D   On: 05/29/2019 12:01   Scheduled Meds: . budesonide (PULMICORT) nebulizer solution  2 mg Nebulization BID  . fluticasone furoate-vilanterol  1 puff Inhalation Daily  . ipratropium-albuterol  3 mL Nebulization Q6H  .  prenatal multivitamin  1 tablet Oral Daily  . topiramate  100 mg Oral BID   Continuous Infusions:   LOS: 0 days    Time spent: [place], MD [facility]   To contact the attending provider between 7A-7P or the covering provider during after hours 7P-7A, please log into the web site [email or web address] and access using universal [place] password for that web site. If you do not have the password, please call the hospital operator.  05/30/2019, 2:06 PM

## 2019-05-30 NOTE — Progress Notes (Signed)
   05/30/19 0830  Assess: MEWS Score  Temp 97.8 F (36.6 C)  BP 111/62  Pulse Rate 73  Resp 16  SpO2 95 %  O2 Device Room Air  Assess: MEWS Score  MEWS Temp 0  MEWS Systolic 0  MEWS Pulse 0  MEWS RR 0  MEWS LOC 0  MEWS Score 0  MEWS Score Color Green  Assess: if the MEWS score is Yellow or Red  Were vital signs taken at a resting state? Yes  Focused Assessment Documented focused assessment  Early Detection of Sepsis Score *See Row Information* Low  MEWS guidelines implemented *See Row Information* No, other (Comment) (mews protocol completed)  Document  Patient Outcome Stabilized after interventions   Yellow MEWS protocol completed. Patient returned to green MEWS and routine vitals.

## 2019-05-30 NOTE — Progress Notes (Signed)
Pt reports fetal movement. Denies vaginal bleeding, leaking of fluid, or abd pain or cramping. FHR 140 BPM by doppler.

## 2019-05-30 NOTE — Progress Notes (Signed)
Initial Nutrition Assessment  DOCUMENTATION CODES:   Obesity unspecified  INTERVENTION:   -Ensure Enlive po TID, each supplement provides 350 kcal and 20 grams of protein -Magic cup TID with meals, each supplement provides 290 kcal and 9 grams of protein -Continue prenatal MVI  NUTRITION DIAGNOSIS:   Increased nutrient needs related to decreased appetite as evidenced by meal completion < 25%.  GOAL:   Patient will meet greater than or equal to 90% of their needs  MONITOR:   PO intake, Supplement acceptance, Labs, Weight trends, Skin, I & O's  REASON FOR ASSESSMENT:   Consult Assessment of nutrition requirement/status  ASSESSMENT:   Kathy Flores is a 29 y.o. female with medical history significant of current 21 wks pregnancy, seasonal asthma, anxiety, presented with worsening of asthma syptoms.  Pt admitted with acute asthma exacerbation.   Reviewed I/O's: +1.2 L x 24 hours  Attempted to speak with pt via phone x 2, however, no answer.   Per chart review, pt is [redacted] weeks pregnant.   Noted limited appetite; meal completion 25%. Pt would greatly benefit from addition of nutritional supplements to support adequate nutrition.   Medications reviewed.   Labs reviewed.   Diet Order:   Diet Order            Diet regular Room service appropriate? Yes; Fluid consistency: Thin  Diet effective now              EDUCATION NEEDS:   No education needs have been identified at this time  Skin:  Skin Assessment: Reviewed RN Assessment  Last BM:  05/29/19  Height:   Ht Readings from Last 1 Encounters:  04/16/19 5\' 2"  (1.575 m)    Weight:   Wt Readings from Last 1 Encounters:  05/29/19 93.8 kg    Ideal Body Weight:  50 kg  BMI:  Body mass index is 37.82 kg/m.  Estimated Nutritional Needs:   Kcal:  1800-2000  Protein:  100-115 grams  Fluid:  > 2 L    Loistine Chance, RD, LDN, [place] Registered Dietitian II Certified Diabetes Care and Education  Specialist Please refer to [facility] for RD and/or RD on-call/weekend/after hours pager

## 2019-05-30 NOTE — Progress Notes (Addendum)
Pt placed on Yellow MEWS at this time. X Blount notified. Pt was ambulating in room and HR increased. Pt also [redacted] weeks pregnant. Will continue to monitor.

## 2019-05-30 NOTE — Progress Notes (Signed)
Brief follow-up to conversation with Dr. Verlon Au. Her current medications are appropriate to use during pregnancy. I reviewed her ultrasound from 05/25/19. Repeat scan will be done in 3-4 weeks to complete anatomy survey. I will message [facility] office to see if she can have an initial prenatal visit prior to 07/04/19. I answered her questions and reassured her that her care is going well. I believe that given her non-viable gestational age and admission was not for obstetric indications admission to medicine service was best for her care, and that transfer from the [facility]U to the Main ED was agreed upon at the time. Thanks for caring for this patient.  Woodroe Mode, MD  05/30/2019 2:42 PM

## 2019-05-30 NOTE — Social Work (Signed)
Follow up appointment scheduled for June 21st at 10am. This will be at [facility]. Information placed on discharge instructions.   Westley Hummer, MSW, [place] [facility]

## 2019-05-30 NOTE — Progress Notes (Signed)
   05/29/19 2018  Assess: MEWS Score  ECG Heart Rate (!) 119  Level of Consciousness Alert  Assess: MEWS Score  MEWS Temp 0  MEWS Systolic 0  MEWS Pulse 2  MEWS RR 0  MEWS LOC 0  MEWS Score 2  MEWS Score Color Yellow  Assess: if the MEWS score is Yellow or Red  Were vital signs taken at a resting state? Yes  Focused Assessment Documented focused assessment  Early Detection of Sepsis Score *See Row Information* Low  MEWS guidelines implemented *See Row Information* No, previously yellow, continue vital signs every 4 hours  Treat  MEWS Interventions Other (Comment) (notified  X. Blount)  Take Vital Signs  Increase Vital Sign Frequency  Yellow: Q 2hr X 2 then Q 4hr X 2, if remains yellow, continue Q 4hrs  Escalate  MEWS: Escalate Yellow: discuss with charge nurse/RN and consider discussing with provider and RRT  Notify: Charge Nurse/RN  Name of Charge Nurse/RN Notified Carla RN  Date Charge Nurse/RN Notified 05/29/19  Time Charge Nurse/RN Notified 2030  Notify: Provider  Provider Name/Title Jeannette Corpus  Date Provider Notified 05/29/19  Time Provider Notified 2048  Notification Type Page  Notification Reason Change in status  Response No new orders  Date of Provider Response  (no response)

## 2019-05-31 ENCOUNTER — Encounter: Payer: Self-pay | Admitting: Family Medicine

## 2019-05-31 ENCOUNTER — Encounter (HOSPITAL_COMMUNITY): Payer: Self-pay

## 2019-05-31 LAB — RENAL FUNCTION PANEL
Albumin: 3 g/dL — ABNORMAL LOW (ref 3.5–5.0)
Anion gap: 9 (ref 5–15)
BUN: 6 mg/dL (ref 6–20)
CO2: 18 mmol/L — ABNORMAL LOW (ref 22–32)
Calcium: 8.7 mg/dL — ABNORMAL LOW (ref 8.9–10.3)
Chloride: 107 mmol/L (ref 98–111)
Creatinine, Ser: 0.69 mg/dL (ref 0.44–1.00)
GFR calc Af Amer: >60 mL/min (ref >60)
GFR calc non Af Amer: >60 mL/min (ref >60)
Glucose, Bld: 109 mg/dL — ABNORMAL HIGH (ref 70–99)
Phosphorus: 3.3 mg/dL (ref 2.5–4.6)
Potassium: 3.8 mmol/L (ref 3.5–5.1)
Sodium: 134 mmol/L — ABNORMAL LOW (ref 135–145)

## 2019-05-31 LAB — CBC WITH DIFFERENTIAL/PLATELET
Abs Immature Granulocytes: 0.06 10*3/uL (ref 0.00–0.07)
Basophils Absolute: 0 10*3/uL (ref 0.0–0.1)
Basophils Relative: 0 %
Eosinophils Absolute: 0 10*3/uL (ref 0.0–0.5)
Eosinophils Relative: 0 %
HCT: 34.9 % — ABNORMAL LOW (ref 36.0–46.0)
Hemoglobin: 11 g/dL — ABNORMAL LOW (ref 12.0–15.0)
Immature Granulocytes: 1 %
Lymphocytes Relative: 12 %
Lymphs Abs: 1.1 10*3/uL (ref 0.7–4.0)
MCH: 27.6 pg (ref 26.0–34.0)
MCHC: 31.5 g/dL (ref 30.0–36.0)
MCV: 87.7 fL (ref 80.0–100.0)
Monocytes Absolute: 0.3 10*3/uL (ref 0.1–1.0)
Monocytes Relative: 4 %
Neutro Abs: 7.3 10*3/uL (ref 1.7–7.7)
Neutrophils Relative %: 83 %
Platelets: 242 10*3/uL (ref 150–400)
RBC: 3.98 MIL/uL (ref 3.87–5.11)
RDW: 14.1 % (ref 11.5–15.5)
WBC: 8.8 10*3/uL (ref 4.0–10.5)
nRBC: 0 % (ref 0.0–0.2)

## 2019-05-31 LAB — BLOOD GAS, ARTERIAL
Acid-base deficit: 8.1 mmol/L — ABNORMAL HIGH (ref 0.0–2.0)
Bicarbonate: 16.4 mmol/L — ABNORMAL LOW (ref 20.0–28.0)
Drawn by: 51133
FIO2: 21
O2 Saturation: 90.9 %
Patient temperature: 37.1
pCO2 arterial: 30.2 mmHg — ABNORMAL LOW (ref 32.0–48.0)
pH, Arterial: 7.354 (ref 7.350–7.450)
pO2, Arterial: 62.6 mmHg — ABNORMAL LOW (ref 83.0–108.0)

## 2019-05-31 MED ORDER — FLUTICASONE PROPIONATE 50 MCG/ACT NA SUSP: 2.0000 | Freq: Every day | NASAL | 0 refills | Status: AC

## 2019-05-31 MED ORDER — ALBUTEROL SULFATE HFA 108 (90 BASE) MCG/ACT IN AERS
2.0000 | INHALATION_SPRAY | RESPIRATORY_TRACT | 1 refills | Status: DC | PRN
Start: 2019-05-31 — End: 2019-07-02

## 2019-05-31 MED ORDER — PREDNISONE 20 MG PO TABS: 20.0000 mg | ORAL_TABLET | Freq: Every day | ORAL | 0 refills | Status: DC

## 2019-05-31 MED ORDER — BENZONATATE 100 MG PO CAPS
100.0000 mg | ORAL_CAPSULE | Freq: Two times a day (BID) | ORAL | 0 refills | Status: DC
Start: 2019-05-31 — End: 2019-07-02

## 2019-05-31 MED ORDER — ALBUTEROL SULFATE (2.5 MG/3ML) 0.083% IN NEBU: 2.5000 mg | INHALATION_SOLUTION | RESPIRATORY_TRACT | 12 refills | Status: DC | PRN

## 2019-05-31 MED ORDER — FLUTICASONE FUROATE-VILANTEROL 100-25 MCG/INH IN AEPB: 1.0000 | INHALATION_SPRAY | Freq: Every day | RESPIRATORY_TRACT | 3 refills | Status: DC

## 2019-05-31 MED ORDER — IPRATROPIUM-ALBUTEROL 0.5-2.5 (3) MG/3ML IN SOLN: 3.0000 mL | Freq: Four times a day (QID) | RESPIRATORY_TRACT | 1 refills | Status: DC

## 2019-05-31 MED FILL — ALBUTEROL SUL 2.5 MG/3 ML S: (2.5 MG/3ML | 12 days supply | Qty: 90 | Fill #0

## 2019-05-31 MED FILL — predniSONE 20 MG TABS: 20 | 4 days supply | Qty: 4 | Fill #0

## 2019-05-31 MED FILL — BENZONATATE 100 MG CAP: 100 | 10 days supply | Qty: 21 | Fill #0

## 2019-05-31 MED FILL — ALBUTEROL SULFATE HFA 108 (: 108 (90 BAS | 16 days supply | Qty: 18 | Fill #0

## 2019-05-31 MED FILL — BREO ELLIPTA 100-25 MCG INH: 100-25 | 30 days supply | Qty: 60 | Fill #0

## 2019-05-31 MED FILL — IPRAT-ALBUT 0.5-3(2.5) MG/3: 0.5-2.5 (3) | 30 days supply | Qty: 360 | Fill #0

## 2019-05-31 MED FILL — FLUTICASONE PROP 50 MCG SPR: 50 | 30 days supply | Qty: 16 | Fill #0

## 2019-05-31 NOTE — Progress Notes (Signed)
Kateline C. Murphyto be D/C'd per MD order. Discussed with the patient and all questions fully answered. VSS, Skin clean, dry and intact without evidence of skin break down, no evidence of skin tears noted. IV catheter discontinued intact. Site without signs and symptoms of complications. Dressing and pressure applied. An After Visit Summary was printed and given to the patient. Patient informed where to pickup prescriptions. D/c education completed with patient/family including follow up instructions, medication list, d/c activities limitations if indicated, with other d/c instructions as indicated by MD - patient able to verbalize understanding, all questions fully answered. Patient instructed to return to ED, call 911, or call MD for any changes in condition. Patient to be escorted via [place], and D/C home via private auto.

## 2019-05-31 NOTE — Discharge Summary (Signed)
Physician Discharge Summary  Kathy Flores [id number] DOB: [birth year] DOA: 05/29/2019  PCP: Patient, No Pcp Per  Admit date: 05/29/2019 Discharge date: 05/31/2019  Time spent: 45 minutes  Recommendations for Outpatient Follow-up:  1. Refilled all medications from prior to admission including all inhalers that she needed and will need to follow-up closely with transitions care appointment 2. Complete steroids in the next several days 3. Work note given 4. It is integral that patient follow-up with Dr. Roselie Awkward of faculty teaching practice for OB intake given she has missed prenatal appointments recently-suggest Glucola ultrasound and other screening as she will be close to 24 weeks at the time of follow-up midmonth 5. May need outpatient nonemergent work-up of metabolic acidosis-query RTA type II-III  Discharge Diagnoses:  Active Problems:   Asthma attacks lasting more than 24 hours   Asthma   Asthma affecting pregnancy in second trimester   Discharge Condition: Improved  Diet recommendation: Regular  Filed Weights   05/29/19 0130  Weight: 93.8 kg    History of present illness:  28 grandmultipara [id number], Prior C section Prior Trich  admit with asthma exacerbation 5/24 Didn't know she was pregnant--has been to multiple exposures to Medical care since March and was told "wasn't pregnant"  Hospital Course:  asthma 1. Hesitate to use systemic steroids-discussed with Dr. Roselie Awkward who will be seeing who states that this should be a reasonable therapeutic option (I had shared decision making with the patient and discussed risk of cleft palate, IUGR in layman's terms with her close}-we will reevaluate the patient a little later and if she is still having wheeze we will start low-dose prednisone 20 mg today Metabolic acidosis unclear etiology dating back to 08/04/2017- Anion gap is not elevated 1. Etiology unclear could be RTA? 2. Obtain nonemergent blood gas tomorrow morning with  labs [place] multiparity, high risk pregnancy 1. Obstetrician Dr. Roselie Awkward aware of patient will see-if stays beyond tomorrow we will discuss with him regarding transferring to his service given our hospitalist-OB/GYN agreement    Consultations:  Dr. Emeterio Reeve of OB/GYN faculty practice  Discharge Exam: Vitals:   05/31/19 0332 05/31/19 0612  BP:  109/66  Pulse:  63  Resp:  18  Temp:  98.6 F (37 C)  SpO2: 100% 99%  Awake alert pleasant walked the whole hallway seem to have desatted overnight but this may be secondary to poor inspiratory effort with habitus  General: EOMI NCAT pleasant no focal deficit Cardiovascular: S1-S2 no murmur rub or gallop Respiratory: Mild wheeze much better than yesterday Abdomen pregnant-soft nontender No lower extremity edema  Discharge Instructions   Discharge Instructions    Diet - low sodium heart healthy   Complete by: As directed    Discharge instructions   Complete by: As directed    Please take all of the meds that we have prescribed for you I have prescribed refills on some of them but you will need to follow-up with outpatient doctor with regards to further management and refills of the same Please keep the appointment with Dr. Roselie Awkward of OB/GYN's service to ensure that you have the prenatal care that is required and catch up immunizations I would also recommend that you follow-up with work and tell them you hospitalized and given you a work note until 5/27 You may need to be checked for sleep apnea post surgery you do not need oxygen and if you have any other severe issues please present to an urgent care or emergency room-please follow-up with  your new primary care appointment on the chart   Increase activity slowly   Complete by: As directed      Allergies as of 05/31/2019      Reactions   Keflet [cephalexin] Itching   Penicillins Hives, Rash   Has patient had a PCN reaction causing immediate rash, facial/tongue/throat swelling, SOB  or lightheadedness with hypotension: Yes Has patient had a PCN reaction causing severe rash involving mucus membranes or skin necrosis: No Has patient had a PCN reaction that required hospitalization No Has patient had a PCN reaction occurring within the last 10 years: No If all of the above answers are "NO", then may proceed with Cephalosporin use.      Medication List    STOP taking these medications   dexamethasone 4 MG tablet Commonly known as: DECADRON   diphenhydrAMINE 25 MG tablet Commonly known as: BENADRYL   metroNIDAZOLE 500 MG tablet Commonly known as: FLAGYL     TAKE these medications   albuterol 108 (90 Base) MCG/ACT inhaler Commonly known as: VENTOLIN HFA Inhale 2 puffs into the lungs every 4 (four) hours as needed for wheezing or shortness of breath. What changed: Another medication with the same name was added. Make sure you understand how and when to take each.   albuterol (2.5 MG/3ML) 0.083% nebulizer solution Commonly known as: PROVENTIL Take 3 mLs (2.5 mg total) by nebulization every 2 (two) hours as needed for shortness of breath. What changed: You were already taking a medication with the same name, and this prescription was added. Make sure you understand how and when to take each.   benzonatate 100 MG capsule Commonly known as: TESSALON Take 1 capsule (100 mg total) by mouth 2 (two) times daily. What changed: when to take this   fluticasone 50 MCG/ACT nasal spray Commonly known as: FLONASE Place 2 sprays into both nostrils daily.   fluticasone furoate-vilanterol 100-25 MCG/INH Aepb Commonly known as: BREO ELLIPTA Inhale 1 puff into the lungs daily. Start taking on: Jun 01, 2019   ipratropium-albuterol 0.5-2.5 (3) MG/3ML Soln Commonly known as: DUONEB Take 3 mLs by nebulization every 6 (six) hours.   predniSONE 20 MG tablet Commonly known as: DELTASONE Take 1 tablet (20 mg total) by mouth daily before breakfast. Start taking on: Jun 01, 2019   Prenatal Vitamin 27-0.8 MG Tabs Take 1 tablet by mouth daily.   topiramate 100 MG tablet Commonly known as: TOPAMAX Take 100 mg by mouth 2 (two) times daily.   traZODone 50 MG tablet Commonly known as: DESYREL Take 50 mg by mouth at bedtime.      Allergies  Allergen Reactions  . Keflet [Cephalexin] Itching  . Penicillins Hives and Rash    Has patient had a PCN reaction causing immediate rash, facial/tongue/throat swelling, SOB or lightheadedness with hypotension: Yes Has patient had a PCN reaction causing severe rash involving mucus membranes or skin necrosis: No Has patient had a PCN reaction that required hospitalization No Has patient had a PCN reaction occurring within the last 10 years: No If all of the above answers are "NO", then may proceed with Cephalosporin use.    Follow-up [facility] Follow up on 06/27/2019.   Why: Appointment made for 10am. Please wear a mask and call if you need to cancel or reschedule. Contact information: [address] [place] [phone]           The results of significant  diagnostics from this hospitalization (including imaging, microbiology, ancillary and laboratory) are listed below for reference.    Significant Diagnostic Studies: DG Chest Portable 1 View  Result Date: 05/29/2019 CLINICAL DATA:  Redness of breath for a week. Wheezing. History of asthma. Five months pregnant. EXAM: PORTABLE CHEST 1 VIEW COMPARISON:  November 09, 2018 FINDINGS: Mild atelectasis in the right base. The heart, hila, mediastinum, lungs, and pleura are normal. No acute abnormalities. IMPRESSION: No active disease. Electronically Signed   By: Dorise Bullion III M.D   On: 05/29/2019 12:01   [place] MFM OB DETAIL +14 WK  Result Date: 05/26/2019 ----------------------------------------------------------------------  OBSTETRICS REPORT                       (Signed Final  05/26/2019 08:21 am) ---------------------------------------------------------------------- Patient Info  ID #:       [id number]                          D.O.B.:  June 29, 1990 (28 yrs)  Name:       HADASHA ARTLEY Etheridge               Visit Date: 05/25/2019 11:46 pm ---------------------------------------------------------------------- Performed By  Attending:        Tama High MD        Referred By:      [facility] MAU/Triage  Performed By:     Hubert Azure          [facility]                    [place] ---------------------------------------------------------------------- Orders  #  Description                           Code        Ordered By  1  [place] MFM OB DETAIL +14 Tripp               [id number]    Hansel Feinstein ----------------------------------------------------------------------  #  Order #                     Accession #                Episode #  1  [id number]                   [id number]                 [id number] ---------------------------------------------------------------------- Indications  [redacted] weeks gestation of pregnancy                Z3A.21  Encounter for uncertain dates                  [id number]  Obesity complicating pregnancy (bmi 38)        O99.210 E66.9  Poor obstetric history: Previous               O09.299  preeclampsia / eclampsia/gestational HTN  (per pt)  Late prenatal care, second trimester           O09.32  Previous cesarean delivery, antepartum  O34.219 ---------------------------------------------------------------------- Vital Signs  Weight (lb): 208                               Height:        5'2"  BMI:         38.04 ---------------------------------------------------------------------- Fetal Evaluation  Num Of Fetuses:         1  Fetal Heart Rate(bpm):  159  Cardiac Activity:       Observed  Presentation:           Cephalic  Placenta:               Posterior, low-lying, 8 mm from int os  P. Cord Insertion:      Visualized   Amniotic Fluid  AFI FV:      Subjectively low-normal                              Largest Pocket(cm)                              3. ---------------------------------------------------------------------- Biometry  BPD:      47.6  mm     G. Age:  20w 3d         20  %    CI:        72.16   %    70 - 86                                                          FL/HC:      20.5   %    15.9 - 20.3  HC:      178.3  mm     G. Age:  20w 2d         10  %    HC/AC:      1.05        1.06 - 1.25  AC:      169.5  mm     G. Age:  22w 0d         69  %    FL/BPD:     76.7   %  FL:       36.5  mm     G. Age:  21w 4d         57  %    FL/AC:      21.5   %    20 - 24  HUM:      33.8  mm     G. Age:  21w 4d         56  %  LV:        3.9  mm  Est. FW:     434  gm    0 lb 15 oz      68  % ---------------------------------------------------------------------- Gestational Age  U/S Today:     21w 1d                                        EDD:  10/04/19  Best:          21w 1d     Det. By:  U/S (05/25/19)           EDD:   10/04/19 ---------------------------------------------------------------------- Anatomy  Cranium:               Appears normal         Aortic Arch:            Appears normal  Cavum:                 Appears normal         Ductal Arch:            Not well visualized  Ventricles:            Appears normal         Diaphragm:              Appears normal  Choroid Plexus:        Appears normal         Stomach:                Appears normal, left                                                                        sided  Cerebellum:            Not well visualized    Abdomen:                Appears normal  Posterior Fossa:       Not well visualized    Abdominal Wall:         Appears nml (cord                                                                        insert, abd wall)  Nuchal Fold:           Not applicable ([id number]    Cord Vessels:           Appears normal ([redacted]                         wks GA)                                         vessel cord)  Face:                  Appears normal         Kidneys:                Appear normal                         (orbits and profile)  Lips:  Appears normal         Bladder:                Appears normal  Thoracic:              Appears normal         Spine:                  Appears normal  Heart:                 Not well visualized    Upper Extremities:      Appears normal  RVOT:                  Appears normal         Lower Extremities:      Appears normal  LVOT:                  Appears normal  Other:  Nasal bone visualized. Technically difficult due to maternal habitus          and fetal position. ---------------------------------------------------------------------- Cervix Uterus Adnexa  Cervix  Length:           3.25  cm.  Normal appearance by transabdominal scan.  Uterus  No abnormality visualized.  Right Ovary  Not visualized.  Left Ovary  Not visualized. ---------------------------------------------------------------------- Impression  G4 P3. Patient was evaluated at the MAU for c/o lower  abdominal cramping. No history of vaginal bleeding.  We performed fetal anatomical survey. Fetal biometry is  consistent with 21w 1d gestation. No markers of aneuploidies  or fetal structural defects are seen. Amniotic fluid is normal  and good fetal activity is seen.  On transabdominal scan, the cervix looks long and closed.  Placenta is low-lying  and is about 8 millimeters from the  internal os. ---------------------------------------------------------------------- Recommendations  -Recommend a follow-up scan in 4 weeks for completion of  fetal anatomy scan and to evaluate the placenta. ----------------------------------------------------------------------                  Tama High, MD Electronically Signed Final Report   05/26/2019 08:21 am ----------------------------------------------------------------------   Microbiology: Recent Results (from the past 240 hour(s))  Wet prep,  genital     Status: Abnormal   Collection Time: 05/25/19 10:21 PM  Result Value Ref Range Status   Yeast Wet Prep HPF POC NONE SEEN NONE SEEN Final   Trich, Wet Prep NONE SEEN NONE SEEN Final   Clue Cells Wet Prep HPF POC PRESENT (A) NONE SEEN Final   WBC, Wet Prep HPF POC MANY (A) NONE SEEN Final   Sperm NONE SEEN  Final    Comment: Performed at [facility], [place] [address], [place], [place] [address]  SARS Coronavirus 2 by RT PCR (hospital order, performed in [facility] hospital lab) Nasopharyngeal Nasopharyngeal Swab     Status: None   Collection Time: 05/29/19  2:55 AM   Specimen: Nasopharyngeal Swab  Result Value Ref Range Status   SARS Coronavirus 2 NEGATIVE NEGATIVE Final    Comment: (NOTE) SARS-CoV-2 target nucleic acids are NOT DETECTED. The SARS-CoV-2 RNA is generally detectable in upper and lower respiratory specimens during the acute phase of infection. The lowest concentration of SARS-CoV-2 viral copies this assay can detect is 250 copies / mL. A negative result does not preclude SARS-CoV-2 infection and should not be used as the sole basis for treatment or other patient management decisions.  A negative  result may occur with improper specimen collection / handling, submission of specimen other than nasopharyngeal swab, presence of viral mutation(s) within the areas targeted by this assay, and inadequate number of viral copies (<250 copies / mL). A negative result must be combined with clinical observations, patient history, and epidemiological information. Fact Sheet for Patients:   StrictlyIdeas.no Fact Sheet for Healthcare Providers: BankingDealers.co.za This test is not yet approved or cleared  by the [place] FDA and has been authorized for detection and/or diagnosis of SARS-CoV-2 by FDA under an Emergency Use Authorization (EUA).  This EUA will remain in effect (meaning this test can be used) for the  duration of the COVID-19 declaration under Section 564(b)(1) of the Act, 21 U.S.C. section 360bbb-3(b)(1), unless the authorization is terminated or revoked sooner. Performed at [facility], [place] [address], [place], [place] [address]      Labs: Basic Metabolic Panel: Recent Labs  Lab 05/29/19 0500 05/29/19 1402 05/30/19 0343 05/31/19 0426  NA 134* 136 136 134*  K 3.7 3.0* 3.5 3.8  CL 108  --  108 107  CO2 19*  --  17* 18*  GLUCOSE 91  --  100* 109*  BUN <5*  --  <5* 6  CREATININE 0.72  --  0.63 0.69  CALCIUM 8.7*  --  8.7* 8.7*  PHOS  --   --   --  3.3   Liver Function Tests: Recent Labs  Lab 05/29/19 0500 05/31/19 0426  AST 15  --   ALT 14  --   ALKPHOS 47  --   BILITOT 0.3  --   PROT 6.4*  --   ALBUMIN 3.1* 3.0*   No results for input(s): LIPASE, AMYLASE in the last 168 hours. No results for input(s): AMMONIA in the last 168 hours. CBC: Recent Labs  Lab 05/25/19 2146 05/29/19 0500 05/29/19 1402 05/30/19 0343 05/31/19 0426  WBC 12.2* 7.1  --  7.6 8.8  NEUTROABS  --  4.9  --   --  7.3  HGB 11.2* 11.4* 11.2* 10.9* 11.0*  HCT 34.4* 35.2* 33.0* 33.8* 34.9*  MCV 85.4 86.5  --  85.8 87.7  PLT 236 236  --  241 242   Cardiac Enzymes: No results for input(s): CKTOTAL, CKMB, CKMBINDEX, TROPONINI in the last 168 hours. BNP: BNP (last 3 results) No results for input(s): BNP in the last 8760 hours.  ProBNP (last 3 results) No results for input(s): PROBNP in the last 8760 hours.  CBG: No results for input(s): GLUCAP in the last 168 hours.     Signed:  Nita Sells MD   [facility] 05/31/2019, 11:13 AM

## 2019-05-31 NOTE — Care Management (Addendum)
Prescriptions sent to [facility] , will enter patient in [facility] program. Co pays were over rode  Patient has a NEB machine at home already. Patient got NEB machine from a DME company on [address] patient unsure of name. Patient needs new mask for her NEB machine , she will contact company. NCM will ask nurse if patient can take mask from hospital home with her.   Magdalen Spatz RN

## 2019-06-06 ENCOUNTER — Inpatient Hospital Stay (HOSPITAL_COMMUNITY)
Admission: AD | Admit: 2019-06-06 | Discharge: 2019-06-07 | Disposition: A | Discharge status: Home / Self Care | Payer: Medicaid Other | Attending: Obstetrics and Gynecology | Admitting: Obstetrics and Gynecology

## 2019-06-06 DIAGNOSIS — Z79899 Other long term (current) drug therapy: Secondary | ICD-10-CM | POA: Insufficient documentation

## 2019-06-06 DIAGNOSIS — Z3A22 22 weeks gestation of pregnancy: Secondary | ICD-10-CM

## 2019-06-06 DIAGNOSIS — Z87891 Personal history of nicotine dependence: Secondary | ICD-10-CM | POA: Insufficient documentation

## 2019-06-06 DIAGNOSIS — Z880 Allergy status to penicillin: Secondary | ICD-10-CM | POA: Insufficient documentation

## 2019-06-06 DIAGNOSIS — Z881 Allergy status to other antibiotic agents status: Secondary | ICD-10-CM | POA: Insufficient documentation

## 2019-06-06 DIAGNOSIS — J45909 Unspecified asthma, uncomplicated: Secondary | ICD-10-CM | POA: Insufficient documentation

## 2019-06-06 DIAGNOSIS — O99512 Diseases of the respiratory system complicating pregnancy, second trimester: Secondary | ICD-10-CM | POA: Insufficient documentation

## 2019-06-06 DIAGNOSIS — R102 Pelvic and perineal pain: Secondary | ICD-10-CM | POA: Insufficient documentation

## 2019-06-06 DIAGNOSIS — O4440 Low lying placenta NOS or without hemorrhage, unspecified trimester: Secondary | ICD-10-CM

## 2019-06-06 DIAGNOSIS — O26892 Other specified pregnancy related conditions, second trimester: Secondary | ICD-10-CM

## 2019-06-07 ENCOUNTER — Encounter (HOSPITAL_COMMUNITY): Payer: Self-pay | Admitting: Obstetrics and Gynecology

## 2019-06-07 DIAGNOSIS — Z79899 Other long term (current) drug therapy: Secondary | ICD-10-CM | POA: Diagnosis not present

## 2019-06-07 DIAGNOSIS — Z87891 Personal history of nicotine dependence: Secondary | ICD-10-CM | POA: Diagnosis not present

## 2019-06-07 DIAGNOSIS — Z881 Allergy status to other antibiotic agents status: Secondary | ICD-10-CM | POA: Diagnosis not present

## 2019-06-07 DIAGNOSIS — Z3A22 22 weeks gestation of pregnancy: Secondary | ICD-10-CM

## 2019-06-07 DIAGNOSIS — O26892 Other specified pregnancy related conditions, second trimester: Secondary | ICD-10-CM | POA: Diagnosis not present

## 2019-06-07 DIAGNOSIS — J45909 Unspecified asthma, uncomplicated: Secondary | ICD-10-CM | POA: Diagnosis not present

## 2019-06-07 DIAGNOSIS — Z880 Allergy status to penicillin: Secondary | ICD-10-CM | POA: Diagnosis not present

## 2019-06-07 DIAGNOSIS — R102 Pelvic and perineal pain: Secondary | ICD-10-CM | POA: Diagnosis not present

## 2019-06-07 DIAGNOSIS — O99512 Diseases of the respiratory system complicating pregnancy, second trimester: Secondary | ICD-10-CM | POA: Diagnosis not present

## 2019-06-07 LAB — WET PREP, GENITAL
Sperm: NONE SEEN
Trich, Wet Prep: NONE SEEN
Yeast Wet Prep HPF POC: NONE SEEN

## 2019-06-07 LAB — URINALYSIS, ROUTINE W REFLEX MICROSCOPIC
Bilirubin Urine: NEGATIVE
Glucose, UA: NEGATIVE mg/dL
Hgb urine dipstick: NEGATIVE
Ketones, ur: NEGATIVE mg/dL
Leukocytes,Ua: NEGATIVE
Nitrite: NEGATIVE
Protein, ur: NEGATIVE mg/dL
Specific Gravity, Urine: 1.008 (ref 1.005–1.030)
pH: 6 (ref 5.0–8.0)

## 2019-06-07 MED ORDER — CYCLOBENZAPRINE HCL 10 MG PO TABS
10.0000 mg | ORAL_TABLET | Freq: Two times a day (BID) | ORAL | 0 refills | Status: DC | PRN
Start: 2019-06-07 — End: 2019-06-20

## 2019-06-07 MED ORDER — CYCLOBENZAPRINE HCL 5 MG PO TABS
10.0000 mg | ORAL_TABLET | Freq: Once | ORAL | Status: AC
  Administered 2019-06-07: 10 mg via ORAL
  Filled 2019-06-07: qty 2

## 2019-06-07 MED ORDER — METRONIDAZOLE 0.75 % VA GEL: 1.0000 | Freq: Every day | VAGINAL | 0 refills | Status: DC

## 2019-06-07 NOTE — MAU Provider Note (Signed)
History     CSN: [id number]  Arrival date and time: 06/06/19 2353   First Provider Initiated Contact with Patient 06/07/19 0041      Chief Complaint  Patient presents with  . Pelvic Pain   Kathy Flores is a 29 y.o. [phone] at [redacted]w[redacted]d who has not established care.  She presents today for Pelvic Pain.  She states she started having pelvic pain around 7pm after walking around [employer] and [employer].  She describes the pain as "pressure and it feels like a brick is sitting on my pelvis and it hurts to walk."  Patient states that she took a shower without relief of symptoms.  She states she had a bowel movement and it did improve the pain, but only briefly.  Patient rates the pain a 9-10/10 when she is ambulating, but is undetectable less "a little bit of cramping" when laying down.  Patient endorses fetal movement and denies vaginal concerns including itching, bleeding, discharge, and leaking.  Patient reports sexual activity 2 days ago, but denies pain or discomfort.      OB History    Gravida  4   Para  3   Term  3   Preterm      AB      Living  3     SAB      TAB      Ectopic      Multiple  0   Live Births  3           Past Medical History:  Diagnosis Date  . Asthma    inhaler used 3x month  . Depression   . Fibroid   . Trichomonas infection   . Vaginal Pap smear, abnormal     Past Surgical History:  Procedure Laterality Date  . CESAREAN SECTION N/A 11/23/2013   Procedure: CESAREAN SECTION;  Surgeon: Woodroe Mode, MD;  Location: [facility];  Service: Obstetrics;  Laterality: N/A;    Family History  Problem Relation Age of Onset  . Asthma Sister   . Healthy Mother   . Healthy Father     Social History   Tobacco Use  . Smoking status: Former Research scientist (life sciences)  . Smokeless tobacco: Never Used  Substance Use Topics  . Alcohol use: No  . Drug use: No    Allergies:  Allergies  Allergen Reactions  . Keflet [Cephalexin] Itching  . Penicillins Hives and  Rash    Has patient had a PCN reaction causing immediate rash, facial/tongue/throat swelling, SOB or lightheadedness with hypotension: Yes Has patient had a PCN reaction causing severe rash involving mucus membranes or skin necrosis: No Has patient had a PCN reaction that required hospitalization No Has patient had a PCN reaction occurring within the last 10 years: No If all of the above answers are "NO", then may proceed with Cephalosporin use.     Medications Prior to Admission  Medication Sig Dispense Refill Last Dose  . albuterol (PROVENTIL) (2.5 MG/3ML) 0.083% nebulizer solution Take 3 mLs (2.5 mg total) by nebulization every 2 (two) hours as needed for shortness of breath. 75 mL 12   . albuterol (VENTOLIN HFA) 108 (90 Base) MCG/ACT inhaler Inhale 2 puffs into the lungs every 4 (four) hours as needed for wheezing or shortness of breath. 8 g 1   . benzonatate (TESSALON) 100 MG capsule Take 1 capsule (100 mg total) by mouth 2 (two) times daily. 21 capsule 0   . fluticasone (FLONASE) 50 MCG/ACT nasal  spray Place 2 sprays into both nostrils daily. 11.1 mL 0   . fluticasone furoate-vilanterol (BREO ELLIPTA) 100-25 MCG/INH AEPB Inhale 1 puff into the lungs daily. 28 each 3   . ipratropium-albuterol (DUONEB) 0.5-2.5 (3) MG/3ML SOLN Take 3 mLs by nebulization every 6 (six) hours. 360 mL 1   . predniSONE (DELTASONE) 20 MG tablet Take 1 tablet (20 mg total) by mouth daily before breakfast. 4 tablet 0   . Prenatal Vit-Fe Fumarate-FA (PRENATAL VITAMIN) 27-0.8 MG TABS Take 1 tablet by mouth daily. 30 tablet 12   . topiramate (TOPAMAX) 100 MG tablet Take 100 mg by mouth 2 (two) times daily.     . traZODone (DESYREL) 50 MG tablet Take 50 mg by mouth at bedtime.        Review of Systems  Constitutional: Negative for chills and fever.  Respiratory: Negative for cough and shortness of breath.   Gastrointestinal: Negative for abdominal pain, constipation, diarrhea, nausea and vomiting.  Genitourinary:  Positive for pelvic pain. Negative for difficulty urinating, dyspareunia, dysuria, vaginal bleeding and vaginal discharge.  Neurological: Negative for dizziness, light-headedness and headaches.   Physical Exam   Blood pressure (!) 104/57, pulse 80, temperature 98.6 F (37 C), resp. rate 18, last menstrual period 09/09/2018.  Physical Exam  Constitutional: She is oriented to person, place, and time. She appears well-developed and well-nourished.  HENT:  Head: Normocephalic and atraumatic.  Eyes: Conjunctivae are normal.  Cardiovascular: Normal rate, regular rhythm and normal heart sounds.  Respiratory: Effort normal and breath sounds normal. No respiratory distress.  GI: Soft. Bowel sounds are normal.  Genitourinary: Cervix exhibits no motion tenderness.    Genitourinary Comments: Speculum Exam Deferred Scant amt thin grey discharge noted at introitus. Malodor present WP collected via blind swab Cervix closed No tenderness in cul de sac.    Musculoskeletal:        General: Normal range of motion.     Cervical back: Normal range of motion.  Neurological: She is alert and oriented to person, place, and time.  Skin: Skin is warm and dry.  Psychiatric: She has a normal mood and affect. Her behavior is normal.    MAU Course  Procedures Results for orders placed or performed during the hospital encounter of 06/06/19 (from the past 24 hour(s))  Urinalysis, Routine w reflex microscopic     Status: None   Collection Time: 06/07/19 12:20 AM  Result Value Ref Range   Color, Urine YELLOW YELLOW   APPearance CLEAR CLEAR   Specific Gravity, Urine 1.008 1.005 - 1.030   pH 6.0 5.0 - 8.0   Glucose, UA NEGATIVE NEGATIVE mg/dL   Hgb urine dipstick NEGATIVE NEGATIVE   Bilirubin Urine NEGATIVE NEGATIVE   Ketones, ur NEGATIVE NEGATIVE mg/dL   Protein, ur NEGATIVE NEGATIVE mg/dL   Nitrite NEGATIVE NEGATIVE   Leukocytes,Ua NEGATIVE NEGATIVE  Wet prep, genital     Status: Abnormal   Collection  Time: 06/07/19 12:59 AM  Result Value Ref Range   Yeast Wet Prep HPF POC NONE SEEN NONE SEEN   Trich, Wet Prep NONE SEEN NONE SEEN   Clue Cells Wet Prep HPF POC PRESENT (A) NONE SEEN   WBC, Wet Prep HPF POC MANY (A) NONE SEEN   Sperm NONE SEEN     MDM Exam Labs: UA, Wet prep Pain Medication  Assessment and Plan  29 year old, [id number]  SIUP at 22.6weeks Pelvic Pain  -Reviewed POC with patient. -Exam performed and findings discussed.  -Reviewed previous  labs and informed that bacterial vaginosis was noted. -Discussed how exam today suspicious for BV s/t thin gray discharge noted at introitus and malodor. -Reviewed treatment options and patient prefers vaginal gel.  -Will give flexeril and heating pad for pelvic pain. -Instructed to ambulate 20-30 minutes after administration. -Warned that symptoms will not fully resolve today and may be ongoing throughout the pregnancy. -Patient requests note for work stating that she needs to sit down.  Informed that provider will give her an OOW excuse for today and other forms to be collected in office.  -Patient reports her NOB appt is scheduled for 2 weeks. Instructed to discuss request for light-duty at that time. -Will await results and assess effects of flexeril dosing.  Maryann Conners 06/07/2019, 12:42 AM   Reassessment (1:38 AM)  -Nurse reports patient with improvement in pain s/p ambulation. -Rx for flexeril sent to pharmacy on file.  -Patient to keep all scheduled appts. -OOW excuse given for RTW on June 3rd -Patient to return to MAU if symptoms worsen or with the onset of new symptoms. -Discharged to home in improved condition.  Maryann Conners MSN, CNM Advanced Practice Provider, [facility]

## 2019-06-07 NOTE — MAU Note (Signed)
Pt reports she started having a lot of pelvic pain and pressure earlier tonight after she went for a walk. Denies any vag bleeding or dishcharge at this time. Difficult to walk with out pain.

## 2019-06-07 NOTE — Discharge Instructions (Signed)
Abdominal Pain During Pregnancy  Abdominal pain is common during pregnancy, and has many possible causes. Some causes are more serious than others, and sometimes the cause is not known. Abdominal pain can be a sign that labor is starting. It can also be caused by normal growth and stretching of muscles and ligaments during pregnancy. Always tell your health care provider if you have any abdominal pain. Follow these instructions at home:  Do not have sex or put anything in your vagina until your pain goes away completely.  Get plenty of rest until your pain improves.  Drink enough fluid to keep your urine pale yellow.  Take over-the-counter and prescription medicines only as told by your health care provider.  Keep all follow-up visits as told by your health care provider. This is important. Contact a health care provider if:  Your pain continues or gets worse after resting.  You have lower abdominal pain that: ? Comes and goes at regular intervals. ? Spreads to your back. ? Is similar to menstrual cramps.  You have pain or burning when you urinate. Get help right away if:  You have a fever or chills.  You have vaginal bleeding.  You are leaking fluid from your vagina.  You are passing tissue from your vagina.  You have vomiting or diarrhea that lasts for more than 24 hours.  Your baby is moving less than usual.  You feel very weak or faint.  You have shortness of breath.  You develop severe pain in your upper abdomen. Summary  Abdominal pain is common during pregnancy, and has many possible causes.  If you experience abdominal pain during pregnancy, tell your health care provider right away.  Follow your health care provider's home care instructions and keep all follow-up visits as directed. This information is not intended to replace advice given to you by your health care provider. Make sure you discuss any questions you have with your health care  provider. Document Revised: 04/12/2018 Document Reviewed: 03/27/2016 Elsevier Patient Education  2020 Elsevier Inc[employer]  

## 2019-06-20 ENCOUNTER — Other Ambulatory Visit: Payer: Self-pay

## 2019-06-20 ENCOUNTER — Encounter: Payer: Self-pay | Admitting: Family Medicine

## 2019-06-20 ENCOUNTER — Ambulatory Visit (INDEPENDENT_AMBULATORY_CARE_PROVIDER_SITE_OTHER): Payer: Self-pay | Admitting: Certified Nurse Midwife

## 2019-06-20 ENCOUNTER — Encounter: Payer: Self-pay | Admitting: Certified Nurse Midwife

## 2019-06-20 VITALS — BP 111/69 | HR 105 | Wt 209.0 lb

## 2019-06-20 DIAGNOSIS — O0932 Supervision of pregnancy with insufficient antenatal care, second trimester: Secondary | ICD-10-CM

## 2019-06-20 DIAGNOSIS — F319 Bipolar disorder, unspecified: Secondary | ICD-10-CM

## 2019-06-20 DIAGNOSIS — Z3A24 24 weeks gestation of pregnancy: Secondary | ICD-10-CM

## 2019-06-20 DIAGNOSIS — O34219 Maternal care for unspecified type scar from previous cesarean delivery: Secondary | ICD-10-CM

## 2019-06-20 DIAGNOSIS — O99891 Other specified diseases and conditions complicating pregnancy: Secondary | ICD-10-CM

## 2019-06-20 DIAGNOSIS — O0990 Supervision of high risk pregnancy, unspecified, unspecified trimester: Secondary | ICD-10-CM | POA: Insufficient documentation

## 2019-06-20 DIAGNOSIS — O4440 Low lying placenta NOS or without hemorrhage, unspecified trimester: Secondary | ICD-10-CM

## 2019-06-20 DIAGNOSIS — O99210 Obesity complicating pregnancy, unspecified trimester: Secondary | ICD-10-CM

## 2019-06-20 DIAGNOSIS — M549 Dorsalgia, unspecified: Secondary | ICD-10-CM

## 2019-06-20 DIAGNOSIS — O99342 Other mental disorders complicating pregnancy, second trimester: Secondary | ICD-10-CM

## 2019-06-20 DIAGNOSIS — O4442 Low lying placenta NOS or without hemorrhage, second trimester: Secondary | ICD-10-CM

## 2019-06-20 DIAGNOSIS — Z3492 Encounter for supervision of normal pregnancy, unspecified, second trimester: Secondary | ICD-10-CM

## 2019-06-20 LAB — POCT URINALYSIS DIP (DEVICE)
Bilirubin Urine: NEGATIVE
Glucose, UA: NEGATIVE mg/dL
Hgb urine dipstick: NEGATIVE
Ketones, ur: NEGATIVE mg/dL
Leukocytes,Ua: NEGATIVE
Nitrite: NEGATIVE
Protein, ur: NEGATIVE mg/dL
Specific Gravity, Urine: 1.015 (ref 1.005–1.030)
Urobilinogen, UA: 0.2 mg/dL (ref 0.0–1.0)
pH: 6 (ref 5.0–8.0)

## 2019-06-20 MED ORDER — CYCLOBENZAPRINE HCL 10 MG PO TABS: 10.0000 mg | ORAL_TABLET | Freq: Two times a day (BID) | ORAL | 1 refills | Status: DC | PRN

## 2019-06-20 MED ORDER — COMFORT FIT MATERNITY SUPP LG MISC: 1.0000 [IU] | Freq: Every day | 0 refills | Status: DC

## 2019-06-20 NOTE — Patient Instructions (Signed)
Glucose Tolerance Test During Pregnancy Why am I having this test? The glucose tolerance test (GTT) is done to check how your body processes sugar (glucose). This is one of several tests used to diagnose diabetes that develops during pregnancy (gestational diabetes mellitus). Gestational diabetes is a temporary form of diabetes that some women develop during pregnancy. It usually occurs during the second trimester of pregnancy and goes away after delivery. Testing (screening) for gestational diabetes usually occurs between 24 and 28 weeks of pregnancy. You may have the GTT test after having a 1-hour glucose screening test if the results from that test indicate that you may have gestational diabetes. You may also have this test if:  You have a history of gestational diabetes.  You have a history of giving birth to very large babies or have experienced repeated fetal loss (stillbirth).  You have signs and symptoms of diabetes, such as: ? Changes in your vision. ? Tingling or numbness in your hands or feet. ? Changes in hunger, thirst, and urination that are not otherwise explained by your pregnancy. What is being tested? This test measures the amount of glucose in your blood at different times during a period of 3 hours. This indicates how well your body is able to process glucose. What kind of sample is taken?  Blood samples are required for this test. They are usually collected by inserting a needle into a blood vessel. How do I prepare for this test?  For 3 days before your test, eat normally. Have plenty of carbohydrate-rich foods.  Follow instructions from your health care provider about: ? Eating or drinking restrictions on the day of the test. You may be asked to not eat or drink anything other than water (fast) starting 8-10 hours before the test. ? Changing or stopping your regular medicines. Some medicines may interfere with this test. Tell a health care provider about:  All  medicines you are taking, including vitamins, herbs, eye drops, creams, and over-the-counter medicines.  Any blood disorders you have.  Any surgeries you have had.  Any medical conditions you have. What happens during the test? First, your blood glucose will be measured. This is referred to as your fasting blood glucose, since you fasted before the test. Then, you will drink a glucose solution that contains a certain amount of glucose. Your blood glucose will be measured again 1, 2, and 3 hours after drinking the solution. This test takes about 3 hours to complete. You will need to stay at the testing location during this time. During the testing period:  Do not eat or drink anything other than the glucose solution.  Do not exercise.  Do not use any products that contain nicotine or tobacco, such as cigarettes and e-cigarettes. If you need help stopping, ask your health care provider. The testing procedure may vary among health care providers and hospitals. How are the results reported? Your results will be reported as milligrams of glucose per deciliter of blood (mg/dL) or millimoles per liter (mmol/L). Your health care provider will compare your results to normal ranges that were established after testing a large group of people (reference ranges). Reference ranges may vary among labs and hospitals. For this test, common reference ranges are:  Fasting: less than 95-105 mg/dL (5.3-5.8 mmol/L).  1 hour after drinking glucose: less than 180-190 mg/dL (10.0-10.5 mmol/L).  2 hours after drinking glucose: less than 155-165 mg/dL (8.6-9.2 mmol/L).  3 hours after drinking glucose: 140-145 mg/dL (7.8-8.1 mmol/L). What do the   results mean? Results within reference ranges are considered normal, meaning that your glucose levels are well-controlled. If two or more of your blood glucose levels are high, you may be diagnosed with gestational diabetes. If only one level is high, your health care  provider may suggest repeat testing or other tests to confirm a diagnosis. Talk with your health care provider about what your results mean. Questions to ask your health care provider Ask your health care provider, or the department that is doing the test:  When will my results be ready?  How will I get my results?  What are my treatment options?  What other tests do I need?  What are my next steps? Summary  The glucose tolerance test (GTT) is one of several tests used to diagnose diabetes that develops during pregnancy (gestational diabetes mellitus). Gestational diabetes is a temporary form of diabetes that some women develop during pregnancy.  You may have the GTT test after having a 1-hour glucose screening test if the results from that test indicate that you may have gestational diabetes. You may also have this test if you have any symptoms or risk factors for gestational diabetes.  Talk with your health care provider about what your results mean. This information is not intended to replace advice given to you by your health care provider. Make sure you discuss any questions you have with your health care provider. Document Revised: 04/15/2018 Document Reviewed: 08/04/2016 Elsevier Patient Education  [place]: You are not alone, Seventy-five percent of women have some sort of abdominal or back pain at some point in their pregnancy. Your baby is growing at a fast pace, which means that your whole body is rapidly trying to adjust to the changes. As your uterus grows, your back may start feeling a bit under stress and this can result in back or abdominal pain that can go from mild, and therefore bearable, to severe pains that will not allow you to sit or lay down comfortably, When it comes to dealing with pregnancy-related pains and cramps, some pregnant women usually prefer natural remedies, which the market is filled with nowadays. For example,  wearing a pregnancy support belt can help ease and lessen your discomfort and pain. WHAT ARE THE BENEFITS OF WEARING A PREGNANCY SUPPORT BELT? A pregnancy support belt provides support to the lower portion of the belly taking some of the weight of the growing uterus and distributing to the other parts of your body. It is designed make you comfortable and gives you extra support. Over the years, the pregnancy apparel market has been studying the needs and wants of pregnant women and they have come up with the most comfortable pregnancy support belts that woman could ever ask for. In fact, you will no longer have to wear a stretched-out or bulky pregnancy belt that is visible underneath your clothes and makes you feel even more uncomfortable. Nowadays, a pregnancy support belt is made of comfortable and stretchy materials that will not irritate your skin but will actually make you feel at ease and you will not even notice you are wearing it. They are easy to put on and adjust during the day and can be worn at night for additional support.  BENEFITS: . Relives Back pain . Relieves Abdominal Muscle and Leg Pain . Stabilizes the Pelvic Ring . Offers a Cushioned Abdominal Lift Pad . Relieves pressure on the Sciatic Nerve Within Minutes WHERE TO GET YOUR PREGNANCY  BELT: [employer] [phone] @[phone]  [place], [place] [address]

## 2019-06-21 ENCOUNTER — Telehealth: Payer: Self-pay | Admitting: Family Medicine

## 2019-06-21 ENCOUNTER — Encounter: Payer: Self-pay | Admitting: Certified Nurse Midwife

## 2019-06-21 DIAGNOSIS — O0932 Supervision of pregnancy with insufficient antenatal care, second trimester: Secondary | ICD-10-CM | POA: Insufficient documentation

## 2019-06-21 DIAGNOSIS — O99210 Obesity complicating pregnancy, unspecified trimester: Secondary | ICD-10-CM | POA: Insufficient documentation

## 2019-06-21 LAB — CBC/D/PLT+RPR+RH+ABO+RUB AB...
Antibody Screen: NEGATIVE
Basophils Absolute: 0 10*3/uL (ref 0.0–0.2)
Basos: 0 %
EOS (ABSOLUTE): 0.5 10*3/uL — ABNORMAL HIGH (ref 0.0–0.4)
Eos: 5 %
HCV Ab: <0.1 s/co ratio (ref 0.0–0.9)
HIV Screen 4th Generation wRfx: NONREACTIVE
Hematocrit: 31.3 % — ABNORMAL LOW (ref 34.0–46.6)
Hemoglobin: 10.6 g/dL — ABNORMAL LOW (ref 11.1–15.9)
Hepatitis B Surface Ag: NEGATIVE
Immature Grans (Abs): 0 10*3/uL (ref 0.0–0.1)
Immature Granulocytes: 0 %
Lymphocytes Absolute: 1.7 10*3/uL (ref 0.7–3.1)
Lymphs: 19 %
MCH: 28.2 pg (ref 26.6–33.0)
MCHC: 33.9 g/dL (ref 31.5–35.7)
MCV: 83 fL (ref 79–97)
Monocytes Absolute: 0.5 10*3/uL (ref 0.1–0.9)
Monocytes: 6 %
Neutrophils Absolute: 6.3 10*3/uL (ref 1.4–7.0)
Neutrophils: 70 %
Platelets: 258 10*3/uL (ref 150–450)
RBC: 3.76 x10E6/uL — ABNORMAL LOW (ref 3.77–5.28)
RDW: 13.5 % (ref 11.7–15.4)
RPR Ser Ql: NONREACTIVE
Rh Factor: POSITIVE
Rubella Antibodies, IGG: 3.58 index (ref >0.99)
WBC: 9 10*3/uL (ref 3.4–10.8)

## 2019-06-21 LAB — HEMOGLOBIN A1C
Est. average glucose Bld gHb Est-mCnc: 105 mg/dL
Hgb A1c MFr Bld: 5.3 % (ref 4.8–5.6)

## 2019-06-21 LAB — HCV INTERPRETATION

## 2019-06-21 MED ORDER — ASPIRIN EC 81 MG PO TBEC: 81.0000 mg | DELAYED_RELEASE_TABLET | Freq: Every day | ORAL | 0 refills | Status: DC

## 2019-06-21 NOTE — Progress Notes (Signed)
History:   CANIYAH MURLEY is a 29 y.o. [phone] at [redacted]w[redacted]d by LMP being seen today for her first obstetrical visit.  Her obstetrical history is significant for hx of cesarean section. Patient does intend to breast feed. Pregnancy history fully reviewed.  Patient reports pelvic pain, back pain and fatigue.     HISTORY: OB History  Gravida Para Term Preterm AB Living  4 3 3  0 0 3  SAB TAB Ectopic Multiple Live Births  0 0 0 0 3    # Outcome Date GA Lbr Len/2nd Weight Sex Delivery Anes PTL Lv  4 Current           3 Term 11/23/13 [redacted]w[redacted]d  7 lb 10.9 oz (3.485 kg) F CS-LTranv EPI  LIV     Complications: Chorioamnionitis     Apgar1: 8  Apgar5: 9  2 Term 01/30/06 [redacted]w[redacted]d  5 lb (2.268 kg)  Vag-Spont   LIV  1 Term 11/13/04 [redacted]w[redacted]d  5 lb (2.268 kg)  Vag-Spont   LIV    Last pap smear was done 2015 and was normal per patient, d/t low lying placenta and need for reassess will hold on pap until third trimester or postpartum.   Past Medical History:  Diagnosis Date  . Asthma    inhaler used 3x month  . Depression   . Fibroid   . Trichomonas infection   . Vaginal Pap smear, abnormal    Past Surgical History:  Procedure Laterality Date  . CESAREAN SECTION N/A 11/23/2013   Procedure: CESAREAN SECTION;  Surgeon: Woodroe Mode, MD;  Location: [facility];  Service: Obstetrics;  Laterality: N/A;   Family History  Problem Relation Age of Onset  . Asthma Sister   . Healthy Mother   . Healthy Father    Social History   Tobacco Use  . Smoking status: Former Research scientist (life sciences)  . Smokeless tobacco: Never Used  Vaping Use  . Vaping Use: Never used  Substance Use Topics  . Alcohol use: No  . Drug use: No   Allergies  Allergen Reactions  . Keflet [Cephalexin] Itching  . Penicillins Hives and Rash    Has patient had a PCN reaction causing immediate rash, facial/tongue/throat swelling, SOB or lightheadedness with hypotension: Yes Has patient had a PCN reaction causing severe rash involving mucus membranes  or skin necrosis: No Has patient had a PCN reaction that required hospitalization No Has patient had a PCN reaction occurring within the last 10 years: No If all of the above answers are "NO", then may proceed with Cephalosporin use.    Current Outpatient Medications on File Prior to Visit  Medication Sig Dispense Refill  . albuterol (VENTOLIN HFA) 108 (90 Base) MCG/ACT inhaler Inhale 2 puffs into the lungs every 4 (four) hours as needed for wheezing or shortness of breath. 8 g 1  . fluticasone (FLONASE) 50 MCG/ACT nasal spray Place 2 sprays into both nostrils daily. 11.1 mL 0  . Prenatal Vit-Fe Fumarate-FA (PRENATAL VITAMIN) 27-0.8 MG TABS Take 1 tablet by mouth daily. 30 tablet 12  . topiramate (TOPAMAX) 100 MG tablet Take 100 mg by mouth 2 (two) times daily.    . traZODone (DESYREL) 50 MG tablet Take 50 mg by mouth at bedtime.     Marland Kitchen albuterol (PROVENTIL) (2.5 MG/3ML) 0.083% nebulizer solution Take 3 mLs (2.5 mg total) by nebulization every 2 (two) hours as needed for shortness of breath. (Patient not taking: Reported on 06/20/2019) 75 mL 12  . benzonatate (TESSALON)  100 MG capsule Take 1 capsule (100 mg total) by mouth 2 (two) times daily. (Patient not taking: Reported on 06/20/2019) 21 capsule 0  . fluticasone furoate-vilanterol (BREO ELLIPTA) 100-25 MCG/INH AEPB Inhale 1 puff into the lungs daily. (Patient not taking: Reported on 06/20/2019) 28 each 3  . ipratropium-albuterol (DUONEB) 0.5-2.5 (3) MG/3ML SOLN Take 3 mLs by nebulization every 6 (six) hours. (Patient not taking: Reported on 06/20/2019) 360 mL 1   No current facility-administered medications on file prior to visit.    Review of Systems Pertinent items noted in HPI and remainder of comprehensive ROS otherwise negative. Physical Exam:   Vitals:   06/20/19 1510  BP: 111/69  Pulse: (!) 105  Weight: 209 lb (94.8 kg)   Fetal Heart Rate (bpm): 135  Uterus:  Fundal Height: 27 cm  System: General: well-developed, well-nourished  female in no acute distress   Skin: normal coloration and turgor, no rashes   Neurologic: oriented, normal, negative, normal mood   Extremities: normal strength, tone, and muscle mass, ROM of all joints is normal   HEENT PERRLA, extraocular movement intact and sclera clear   Mouth/Teeth mucous membranes moist, pharynx normal without lesions and dental hygiene good   Neck supple and no masses   Cardiovascular: regular rate and rhythm   Respiratory:  no respiratory distress, normal breath sounds   Abdomen: soft, non-tender; gravid, bowel sounds normal; no masses,  no organomegaly    Assessment:    Pregnancy: [id number] Patient Active Problem List   Diagnosis Date Noted  . Supervision of low-risk pregnancy 06/20/2019  . Bipolar depression ([place]) 06/20/2019  . History of cesarean section complicating pregnancy 73/41/9379  . Asthma affecting pregnancy in second trimester 05/30/2019  . Asthma 05/29/2019  . Moderate persistent asthma with exacerbation   . Low-lying placenta 05/26/2019  . Asthma attacks lasting more than 24 hours   . Metabolic acidosis   . Acute respiratory failure with hypoxemia ([place]) 09/23/2015  . SIRS (systemic inflammatory response syndrome) ([place]) 09/23/2015  . Normochromic normocytic anemia 09/23/2015  . Lactic acidosis   . Encounter for observation of infant for suspected infection   . Post term pregnancy, 41 weeks 11/22/2013  . Post-term pregnancy, 40-42 weeks of gestation   . [redacted] weeks gestation of pregnancy      Plan:    1. Encounter for supervision of low-risk pregnancy in second trimester - Welcomed to practice and introduced self to patient  - Reviewed safety, visitor policy, reassurance about COVID-19 for pregnancy at this time. Discussed possible changes to visits, including televisits, that may occur due to COVID-19.  The office remains open if pt needs to be seen and MAU is open 24 hours/day for OB emergencies. - Anticipatory guidance on upcoming  appointments including GTT at next visit, discussed with patient to fast after MN for GTT, patient verbalizes understanding  - [facility] AMB BABYSCRIPTS SCHEDULE OPTIMIZATION - Culture, OB Urine - Genetic Screening - CBC/D/Plt+RPR+Rh+ABO+Rub Ab... - Hemoglobin A1c - [place] [facility] OB FOLLOW UP; Future  2. Low-lying placenta - [place] [facility] OB FOLLOW UP; Future  3. Bipolar depression ([place]) - Currently on Topamax and Desyrel, managed by primary   4. History of cesarean section complicating pregnancy - history of C/S in 2015 for chorioamnionitis, failure to progress, arrest of dilation and fetal intolerance or labor  - patient has to prior vaginal deliveries before C/S  - educated and discussed repeat C/S vs TOLAC, patient would like TOLAC, discussed with patient that she will meet with  MD to discussed r/b and sign consent   5. Back pain affecting pregnancy in second trimester - Patient reports being seen in MAU multiple times for pelvic pain and back pain  - Patient rates pain 9/10 when it occurs, usually gets worse at work d/t patient working on [employer] and constantly twisting/turning and standing - Pregnancy work restrictions note given to patient  - Patient request to be taken out of work, educated and discussed FMLA with patient - instructed patient that earliest will be able to be taken out of work for maternity leave is September 1st, patient verbalizes understanding - patient is taking Flexeril currently for pain, request refill - educated and discussed use of maternity support belt while she is at work, Rx for maternity support belt given to patient.  - Elastic Bandages & Supports (COMFORT FIT MATERNITY SUPP LG) MISC; 1 Units by Does not apply route daily.  Dispense: 1 each; Refill: 0 - cyclobenzaprine (FLEXERIL) 10 MG tablet; Take 1 tablet (10 mg total) by mouth 2 (two) times daily as needed for muscle spasms.  Dispense: 20 tablet; Refill: 1  6. Late prenatal care affecting pregnancy in second  trimester - Care initiated at [redacted]w[redacted]d - Patient has been seen in MAU multiple times, received complete anatomy US and GC/C in MAU on 5/19   Initial labs drawn. Continue prenatal vitamins. Problem list reviewed and updated. Genetic Screening discussed, NIPS: ordered. Follow up ultrasound to reassess placenta: ordered. Discussed usage of Babyscripts and virtual visits as additional source of managing and completing prenatal visits in midst of coronavirus and pandemic.   Anticipatory guidance for prenatal visits including labs, ultrasounds, and testing; Initial labs drawn. Encouraged to complete MyChart Registration for her ability to review results, send requests, and have questions addressed.  The nature of [place] [facility] with multiple MDs and Advanced Practice Providers was explained to patient; also emphasized that residents, students are part of our team. Routine obstetric precautions reviewed. Encouraged to seek out care at office or emergency room [facility] MAU preferred) for urgent and/or emergent concerns. Return in about 4 weeks (around 07/18/2019) for ROB/GTT- with MD to discuss TOLAC.     Lajean Manes, [place] for [employer], [place]

## 2019-06-21 NOTE — Telephone Encounter (Signed)
Patient called in the office stating that she needs a letter for working stating that she cannot work because she is in a lot of pain. Patient instructed that I could not give her a note taking her out of work unless a provider cleared her to be out. Patient instructed that a message would be sent to the nurses that will reach out to the provider and they will contact her back. Patient verbalized understanding. Message sent to clinical pool.

## 2019-06-21 NOTE — Telephone Encounter (Signed)
Called pt and confirmed with patient that she is looking to be taken out of work for the duration of pregnancy.  Pt stated "yes, I can not stand for long period's of time, lower back pain, and also I am abdominal pain."  I explained to the pt that unfortunately we do not take patients out of work for normal symptoms of pregnancy.  I informed pt that we can provide a generic work restriction letter.  Pt stated that she had that and it did not work for her job.  I advised pt that she can use a maternity support belt and heat pad 20 min at time.  I informed pt that she has only 12 weeks for FMLA if she qualified and her coming out of work now would not leave her any time with her baby.  Pt verbalized understanding with no further questions.   Mel Almond, RN  06/21/19

## 2019-06-22 LAB — CULTURE, OB URINE

## 2019-06-22 LAB — URINE CULTURE, OB REFLEX

## 2019-06-26 NOTE — Progress Notes (Deleted)
Subjective:    Patient ID: Kathy Flores, female    DOB: Aug 27, 1990, 29 y.o.   MRN: [id number]  28 y.o.F    Post Hosp F/u and to establish   Admit date: 05/29/2019 Discharge date: 05/31/2019  Time spent: 45 minutes  Recommendations for Outpatient Follow-up:  1. Refilled all medications from prior to admission including all inhalers that she needed and will need to follow-up closely with transitions care appointment 2. Complete steroids in the next several days 3. Work note given 4. It is integral that patient follow-up with Dr. Roselie Awkward of faculty teaching practice for OB intake given she has missed prenatal appointments recently-suggest Glucola ultrasound and other screening as she will be close to 24 weeks at the time of follow-up midmonth 5. May need outpatient nonemergent work-up of metabolic acidosis-query RTA type II-III  Discharge Diagnoses:  Active Problems:   Asthma attacks lasting more than 24 hours   Asthma   Asthma affecting pregnancy in second trimester   Discharge Condition: Improved  Diet recommendation: Regular  Filed Weights  05/29/19 0130 Weight: 93.8 kg   History of present illness:  28 grandmultipara G4P3003, Prior C section Prior Trich admit with asthma exacerbation 5/24 Didn't know she was pregnant--has been to multiple exposures to Medical care since March and was told "wasn't pregnant"  Hospital Course:  asthma 1. Hesitate to usesystemic steroids-discussed with Dr. Roselie Awkward who will be seeing who states that this should be a reasonable therapeutic option (I had shared decision making with the patient and discussed risk of cleft palate, IUGR in layman's terms with her close}-we will reevaluate the patient a little later and if she is still having wheeze we will start low-dose prednisone 20 mg today Metabolic acidosis unclear etiology dating back to 08/04/2017- Anion gap is not elevated 1. Etiology unclear could be RTA? 2. Obtain  nonemergent blood gas tomorrow morning with labs [place] multiparity, high risk pregnancy 1. Obstetrician Dr. Roselie Awkward aware of patient will see-if stays beyond tomorrow we will discuss with him regarding transferring to his service given our hospitalist-OB/GYN agreement   29 year old, G4P3003  SIUP at 22.6weeks Pelvic Pain  -Reviewed POC with patient. -Exam performed and findings discussed.  -Reviewed previous labs and informed that bacterial vaginosis was noted. -Discussed how exam today suspicious for BV s/t thin gray discharge noted at introitus and malodor. -Reviewed treatment options and patient prefers vaginal gel.  -Will give flexeril and heating pad for pelvic pain. -Instructed to ambulate 20-30 minutes after administration. -Warned that symptoms will not fully resolve today and may be ongoing throughout the pregnancy. -Patient requests note for work stating that she needs to sit down.  Informed that provider will give her an OOW excuse for today and other forms to be collected in office.  -Patient reports her NOB appt is scheduled for 2 weeks. Instructed to discuss request for light-duty at that time. -Will await results and assess effects of flexeril dosing.  Kathy Flores 06/07/2019, 12:42 AM   Reassessment (1:38 AM)  -Nurse reports patient with improvement in pain s/p ambulation. -Rx for flexeril sent to pharmacy on file.  -Patient to keep all scheduled appts. -OOW excuse given for RTW on June 3rd -Patient to return to MAU if symptoms worsen or with the onset of new symptoms. -Discharged to home in improved condition.        Review of Systems     Objective:   Physical Exam        Assessment &  Plan:

## 2019-06-27 ENCOUNTER — Inpatient Hospital Stay: Payer: Self-pay | Admitting: Critical Care Medicine

## 2019-06-30 ENCOUNTER — Other Ambulatory Visit: Payer: Self-pay | Admitting: Certified Nurse Midwife

## 2019-06-30 ENCOUNTER — Other Ambulatory Visit: Payer: Self-pay | Admitting: *Deleted

## 2019-06-30 ENCOUNTER — Other Ambulatory Visit: Payer: Self-pay

## 2019-06-30 ENCOUNTER — Ambulatory Visit: Payer: Medicaid Other | Attending: Obstetrics and Gynecology

## 2019-06-30 DIAGNOSIS — Z3A26 26 weeks gestation of pregnancy: Secondary | ICD-10-CM

## 2019-06-30 DIAGNOSIS — O321XX0 Maternal care for breech presentation, not applicable or unspecified: Secondary | ICD-10-CM

## 2019-06-30 DIAGNOSIS — O365920 Maternal care for other known or suspected poor fetal growth, second trimester, not applicable or unspecified: Secondary | ICD-10-CM

## 2019-06-30 DIAGNOSIS — O0932 Supervision of pregnancy with insufficient antenatal care, second trimester: Secondary | ICD-10-CM | POA: Diagnosis not present

## 2019-06-30 DIAGNOSIS — Z362 Encounter for other antenatal screening follow-up: Secondary | ICD-10-CM | POA: Diagnosis not present

## 2019-06-30 DIAGNOSIS — O4442 Low lying placenta NOS or without hemorrhage, second trimester: Secondary | ICD-10-CM | POA: Insufficient documentation

## 2019-06-30 DIAGNOSIS — O99212 Obesity complicating pregnancy, second trimester: Secondary | ICD-10-CM | POA: Diagnosis not present

## 2019-06-30 DIAGNOSIS — Z3492 Encounter for supervision of normal pregnancy, unspecified, second trimester: Secondary | ICD-10-CM

## 2019-06-30 DIAGNOSIS — O4440 Low lying placenta NOS or without hemorrhage, unspecified trimester: Secondary | ICD-10-CM

## 2019-06-30 DIAGNOSIS — O09292 Supervision of pregnancy with other poor reproductive or obstetric history, second trimester: Secondary | ICD-10-CM | POA: Diagnosis not present

## 2019-06-30 DIAGNOSIS — E669 Obesity, unspecified: Secondary | ICD-10-CM

## 2019-06-30 DIAGNOSIS — Z8759 Personal history of other complications of pregnancy, childbirth and the puerperium: Secondary | ICD-10-CM

## 2019-06-30 DIAGNOSIS — O34219 Maternal care for unspecified type scar from previous cesarean delivery: Secondary | ICD-10-CM

## 2019-07-01 ENCOUNTER — Other Ambulatory Visit: Payer: Self-pay

## 2019-07-01 ENCOUNTER — Encounter (HOSPITAL_COMMUNITY): Payer: Self-pay | Admitting: Obstetrics & Gynecology

## 2019-07-01 ENCOUNTER — Inpatient Hospital Stay (HOSPITAL_COMMUNITY)
Admission: AD | Admit: 2019-07-01 | Discharge: 2019-07-02 | Disposition: A | Discharge status: Home / Self Care | Payer: Medicaid Other | Attending: Obstetrics & Gynecology | Admitting: Obstetrics & Gynecology

## 2019-07-01 DIAGNOSIS — Z79899 Other long term (current) drug therapy: Secondary | ICD-10-CM | POA: Insufficient documentation

## 2019-07-01 DIAGNOSIS — Z881 Allergy status to other antibiotic agents status: Secondary | ICD-10-CM | POA: Insufficient documentation

## 2019-07-01 DIAGNOSIS — O99342 Other mental disorders complicating pregnancy, second trimester: Secondary | ICD-10-CM | POA: Insufficient documentation

## 2019-07-01 DIAGNOSIS — O4440 Low lying placenta NOS or without hemorrhage, unspecified trimester: Secondary | ICD-10-CM

## 2019-07-01 DIAGNOSIS — Z880 Allergy status to penicillin: Secondary | ICD-10-CM | POA: Diagnosis not present

## 2019-07-01 DIAGNOSIS — O0932 Supervision of pregnancy with insufficient antenatal care, second trimester: Secondary | ICD-10-CM

## 2019-07-01 DIAGNOSIS — O99512 Diseases of the respiratory system complicating pregnancy, second trimester: Secondary | ICD-10-CM | POA: Insufficient documentation

## 2019-07-01 DIAGNOSIS — Z3492 Encounter for supervision of normal pregnancy, unspecified, second trimester: Secondary | ICD-10-CM

## 2019-07-01 DIAGNOSIS — Z3A26 26 weeks gestation of pregnancy: Secondary | ICD-10-CM

## 2019-07-01 DIAGNOSIS — Z7982 Long term (current) use of aspirin: Secondary | ICD-10-CM | POA: Diagnosis not present

## 2019-07-01 DIAGNOSIS — O26892 Other specified pregnancy related conditions, second trimester: Secondary | ICD-10-CM

## 2019-07-01 DIAGNOSIS — F329 Major depressive disorder, single episode, unspecified: Secondary | ICD-10-CM | POA: Insufficient documentation

## 2019-07-01 DIAGNOSIS — O34219 Maternal care for unspecified type scar from previous cesarean delivery: Secondary | ICD-10-CM

## 2019-07-01 DIAGNOSIS — R109 Unspecified abdominal pain: Secondary | ICD-10-CM | POA: Insufficient documentation

## 2019-07-01 DIAGNOSIS — Z87891 Personal history of nicotine dependence: Secondary | ICD-10-CM | POA: Insufficient documentation

## 2019-07-01 DIAGNOSIS — Z7951 Long term (current) use of inhaled steroids: Secondary | ICD-10-CM | POA: Diagnosis not present

## 2019-07-01 DIAGNOSIS — J45909 Unspecified asthma, uncomplicated: Secondary | ICD-10-CM | POA: Diagnosis not present

## 2019-07-01 DIAGNOSIS — F319 Bipolar disorder, unspecified: Secondary | ICD-10-CM

## 2019-07-01 DIAGNOSIS — Z3689 Encounter for other specified antenatal screening: Secondary | ICD-10-CM

## 2019-07-01 DIAGNOSIS — M549 Dorsalgia, unspecified: Secondary | ICD-10-CM

## 2019-07-01 LAB — WET PREP, GENITAL
Clue Cells Wet Prep HPF POC: NONE SEEN
Sperm: NONE SEEN
Trich, Wet Prep: NONE SEEN
Yeast Wet Prep HPF POC: NONE SEEN

## 2019-07-01 LAB — URINALYSIS, ROUTINE W REFLEX MICROSCOPIC
Bilirubin Urine: NEGATIVE
Glucose, UA: NEGATIVE mg/dL
Hgb urine dipstick: NEGATIVE
Ketones, ur: NEGATIVE mg/dL
Leukocytes,Ua: NEGATIVE
Nitrite: NEGATIVE
Protein, ur: NEGATIVE mg/dL
Specific Gravity, Urine: 1.005 (ref 1.005–1.030)
pH: 6 (ref 5.0–8.0)

## 2019-07-01 MED ORDER — IBUPROFEN 800 MG PO TABS
800.0000 mg | ORAL_TABLET | Freq: Once | ORAL | Status: AC
  Administered 2019-07-01: 800 mg via ORAL
  Filled 2019-07-01: qty 1

## 2019-07-01 NOTE — MAU Provider Note (Signed)
History     CSN: [id number]  Arrival date and time: 07/01/19 2132   First Provider Initiated Contact with Patient 07/01/19 2235      Chief Complaint  Patient presents with  . Abdominal Pain   Kathy Flores is a 29 y.o. [phone] at [redacted]w[redacted]d who receives care at [facility].  She presents today for Abdominal Pain. She states she started experiencing back pain that radiates to her abdomen around 1700.  She reports taking a flexeril and falling asleep, but pain was worse upon awakening. She rates the pain a 8/10 and is unable to describe it, but states "it is worse than cramping and makes me want to get in a fetal position and ball up." She reports the pain is constant and has no known aggravating or relieving factors.  She endorses fetal movement and denies vaginal discharge, bleeding, or leaking.      OB History    Gravida  4   Para  3   Term  3   Preterm      AB      Living  3     SAB      TAB      Ectopic      Multiple  0   Live Births  3           Past Medical History:  Diagnosis Date  . [redacted] weeks gestation of pregnancy   . Asthma    inhaler used 3x month  . Depression   . Encounter for observation of infant for suspected infection   . Fibroid   . Post term pregnancy, 41 weeks 11/22/2013  . Post-term pregnancy, 40-42 weeks of gestation   . Trichomonas infection   . Vaginal Pap smear, abnormal     Past Surgical History:  Procedure Laterality Date  . CESAREAN SECTION N/A 11/23/2013   Procedure: CESAREAN SECTION;  Surgeon: Woodroe Mode, MD;  Location: [facility];  Service: Obstetrics;  Laterality: N/A;    Family History  Problem Relation Age of Onset  . Asthma Sister   . Healthy Mother   . Healthy Father     Social History   Tobacco Use  . Smoking status: Former Research scientist (life sciences)  . Smokeless tobacco: Never Used  Vaping Use  . Vaping Use: Never used  Substance Use Topics  . Alcohol use: No  . Drug use: No    Allergies:  Allergies  Allergen  Reactions  . Keflet [Cephalexin] Itching  . Penicillins Hives and Rash    Has patient had a PCN reaction causing immediate rash, facial/tongue/throat swelling, SOB or lightheadedness with hypotension: Yes Has patient had a PCN reaction causing severe rash involving mucus membranes or skin necrosis: No Has patient had a PCN reaction that required hospitalization No Has patient had a PCN reaction occurring within the last 10 years: No If all of the above answers are "NO", then may proceed with Cephalosporin use.     Medications Prior to Admission  Medication Sig Dispense Refill Last Dose  . albuterol (PROVENTIL) (2.5 MG/3ML) 0.083% nebulizer solution Take 3 mLs (2.5 mg total) by nebulization every 2 (two) hours as needed for shortness of breath. (Patient not taking: Reported on 06/20/2019) 75 mL 12   . albuterol (VENTOLIN HFA) 108 (90 Base) MCG/ACT inhaler Inhale 2 puffs into the lungs every 4 (four) hours as needed for wheezing or shortness of breath. 8 g 1   . aspirin EC 81 MG tablet Take 1 tablet (81 mg  total) by mouth daily. Take after 12 weeks for prevention of preeclampsia later in pregnancy 300 tablet 0   . benzonatate (TESSALON) 100 MG capsule Take 1 capsule (100 mg total) by mouth 2 (two) times daily. (Patient not taking: Reported on 06/20/2019) 21 capsule 0   . cyclobenzaprine (FLEXERIL) 10 MG tablet Take 1 tablet (10 mg total) by mouth 2 (two) times daily as needed for muscle spasms. 20 tablet 1   . Elastic Bandages & Supports (COMFORT FIT MATERNITY SUPP LG) MISC 1 Units by Does not apply route daily. 1 each 0   . fluticasone (FLONASE) 50 MCG/ACT nasal spray Place 2 sprays into both nostrils daily. 11.1 mL 0   . fluticasone furoate-vilanterol (BREO ELLIPTA) 100-25 MCG/INH AEPB Inhale 1 puff into the lungs daily. (Patient not taking: Reported on 06/20/2019) 28 each 3   . ipratropium-albuterol (DUONEB) 0.5-2.5 (3) MG/3ML SOLN Take 3 mLs by nebulization every 6 (six) hours. (Patient not  taking: Reported on 06/20/2019) 360 mL 1   . Prenatal Vit-Fe Fumarate-FA (PRENATAL VITAMIN) 27-0.8 MG TABS Take 1 tablet by mouth daily. 30 tablet 12   . topiramate (TOPAMAX) 100 MG tablet Take 100 mg by mouth 2 (two) times daily.     . traZODone (DESYREL) 50 MG tablet Take 50 mg by mouth at bedtime.        Review of Systems  Respiratory: Negative for cough and shortness of breath.   Gastrointestinal: Positive for abdominal pain. Negative for nausea and vomiting.  Genitourinary: Negative for difficulty urinating, dysuria, vaginal bleeding and vaginal discharge.  Musculoskeletal: Positive for back pain.  Neurological: Negative for dizziness, light-headedness and headaches.   Physical Exam   Blood pressure 111/64, pulse 93, temperature 98.4 F (36.9 C), temperature source Oral, resp. rate 19, weight 94.5 kg, last menstrual period 09/09/2018.  Physical Exam Vitals and nursing note reviewed.  Constitutional:      Appearance: She is well-developed.  HENT:     Head: Normocephalic and atraumatic.  Eyes:     Conjunctiva/sclera: Conjunctivae normal.  Cardiovascular:     Rate and Rhythm: Normal rate and regular rhythm.     Heart sounds: Normal heart sounds.  Pulmonary:     Effort: Pulmonary effort is normal. No respiratory distress.     Breath sounds: Normal breath sounds.  Abdominal:     Tenderness: There is no abdominal tenderness.     Comments: Gravid--fundal height appears LGA, Soft, NT Mild flank pain on left side.   Musculoskeletal:        General: Normal range of motion.  Skin:    General: Skin is warm and dry.  Neurological:     Mental Status: She is alert and oriented to person, place, and time.  Psychiatric:        Mood and Affect: Mood normal.        Thought Content: Thought content normal.        Judgment: Judgment normal.     Fetal Assessment 150 bpm, Mod Var, -Decels, +Accels Toco: No Ctx Noted  MAU Course   Results for orders placed or performed during the  hospital encounter of 07/01/19 (from the past 24 hour(s))  Urinalysis, Routine w reflex microscopic     Status: Abnormal   Collection Time: 07/01/19  9:40 PM  Result Value Ref Range   Color, Urine STRAW (A) YELLOW   APPearance CLEAR CLEAR   Specific Gravity, Urine 1.005 1.005 - 1.030   pH 6.0 5.0 - 8.0   Glucose,  UA NEGATIVE NEGATIVE mg/dL   Hgb urine dipstick NEGATIVE NEGATIVE   Bilirubin Urine NEGATIVE NEGATIVE   Ketones, ur NEGATIVE NEGATIVE mg/dL   Protein, ur NEGATIVE NEGATIVE mg/dL   Nitrite NEGATIVE NEGATIVE   Leukocytes,Ua NEGATIVE NEGATIVE  Wet prep, genital     Status: Abnormal   Collection Time: 07/01/19 10:57 PM  Result Value Ref Range   Yeast Wet Prep HPF POC NONE SEEN NONE SEEN   Trich, Wet Prep NONE SEEN NONE SEEN   Clue Cells Wet Prep HPF POC NONE SEEN NONE SEEN   WBC, Wet Prep HPF POC FEW (A) NONE SEEN   Sperm NONE SEEN    No results found.  MDM PE Labs:Wet prep, UA EFM Pain medication Assessment and Plan  29 year old G21P3003  SIUP at 26.2weeks Cat I FT Abdominal Pain  -POC reviewed. -Informed that since pain is constant, more suspicious for musculoskeletal than contractions. -Will have nurse collect WP via blind swab. -Will give ibuprofen and heating pad then reassess. -NST Reactive.  Maryann Conners MSN, CNM 07/01/2019, 10:35 PM   Reassessment (11:59 PM)  -Patient reports pain has improved and now 5/10. -Instructed to go home and rest. -Will not send Rx for ibuprofen, but instructed to take flexeril if pain intensity rises. -Encouraged to call or return to MAU if symptoms worsen or with the onset of new symptoms. -Instructed to keep next PNV as scheduled. -Patient without questions or concerns. -Discharged to home in improved condition.  Maryann Conners MSN, CNM Advanced Practice Provider, [facility] for [employer]

## 2019-07-01 NOTE — MAU Note (Signed)
Patient reports severe pain in her lower abdomen that radiates to her sides and back.   Took a flexeril around 1700 and took a nap, woke up with worse pain.  Denies VB/LOF.  Endorses + FM.

## 2019-07-02 NOTE — Discharge Instructions (Signed)
Back Pain in Pregnancy Back pain during pregnancy is common. Back pain may be caused by several factors that are related to changes during your pregnancy. Follow these instructions at home: Managing pain, stiffness, and swelling      If directed, for sudden (acute) back pain, put ice on the painful area. ? Put ice in a plastic bag. ? Place a towel between your skin and the bag. ? Leave the ice on for 20 minutes, 2-3 times per day.  If directed, apply heat to the affected area before you exercise. Use the heat source that your health care provider recommends, such as a moist heat pack or a heating pad. ? Place a towel between your skin and the heat source. ? Leave the heat on for 20-30 minutes. ? Remove the heat if your skin turns bright red. This is especially important if you are unable to feel pain, heat, or cold. You may have a greater risk of getting burned.  If directed, massage the affected area. Activity  Exercise as told by your health care provider. Gentle exercise is the best way to prevent or manage back pain.  Listen to your body when lifting. If lifting hurts, ask for help or bend your knees. This uses your leg muscles instead of your back muscles.  Squat down when picking up something from the floor. Do not bend over.  Only use bed rest for short periods as told by your health care provider. Bed rest should only be used for the most severe episodes of back pain. Standing, sitting, and lying down  Do not stand in one place for long periods of time.  Use good posture when sitting. Make sure your head rests over your shoulders and is not hanging forward. Use a pillow on your lower back if necessary.  Try sleeping on your side, preferably the left side, with a pregnancy support pillow or 1-2 regular pillows between your legs. ? If you have back pain after a night's rest, your bed may be too soft. ? A firm mattress may provide more support for your back during  pregnancy. General instructions  Do not wear high heels.  Eat a healthy diet. Try to gain weight within your health care provider's recommendations.  Use a maternity girdle, elastic sling, or back brace as told by your health care provider.  Take over-the-counter and prescription medicines only as told by your health care provider.  Work with a physical therapist or massage therapist to find ways to manage back pain. Acupuncture or massage therapy may be helpful.  Keep all follow-up visits as told by your health care provider. This is important. Contact a health care provider if:  Your back pain interferes with your daily activities.  You have increasing pain in other parts of your body. Get help right away if:  You develop numbness, tingling, weakness, or problems with the use of your arms or legs.  You develop severe back pain that is not controlled with medicine.  You have a change in bowel or bladder control.  You develop shortness of breath, dizziness, or you faint.  You develop nausea, vomiting, or sweating.  You have back pain that is a rhythmic, cramping pain similar to labor pains. Labor pain is usually 1-2 minutes apart, lasts for about 1 minute, and involves a bearing down feeling or pressure in your pelvis.  You have back pain and your water breaks or you have vaginal bleeding.  You have back pain or numbness  that travels down your leg. °· Your back pain developed after you fell. °· You develop pain on one side of your back. °· You see blood in your urine. °· You develop skin blisters in the area of your back pain. °Summary °· Back pain may be caused by several factors that are related to changes during your pregnancy. °· Follow instructions as told by your health care provider for managing pain, stiffness, and swelling. °· Exercise as told by your health care provider. Gentle exercise is the best way to prevent or manage back pain. °· Take over-the-counter and  prescription medicines only as told by your health care provider. °· Keep all follow-up visits as told by your health care provider. This is important. °This information is not intended to replace advice given to you by your health care provider. Make sure you discuss any questions you have with your health care provider. °Document Revised: 04/13/2018 Document Reviewed: 06/10/2017 °Elsevier Patient Education © 2020 [employer] ° °

## 2019-07-04 ENCOUNTER — Encounter: Payer: Self-pay | Admitting: Obstetrics and Gynecology

## 2019-07-13 ENCOUNTER — Ambulatory Visit: Payer: Medicaid Other | Attending: Obstetrics and Gynecology

## 2019-07-13 ENCOUNTER — Ambulatory Visit: Payer: Medicaid Other | Admitting: *Deleted

## 2019-07-13 ENCOUNTER — Other Ambulatory Visit: Payer: Self-pay

## 2019-07-13 DIAGNOSIS — E669 Obesity, unspecified: Secondary | ICD-10-CM

## 2019-07-13 DIAGNOSIS — O365930 Maternal care for other known or suspected poor fetal growth, third trimester, not applicable or unspecified: Secondary | ICD-10-CM

## 2019-07-13 DIAGNOSIS — F319 Bipolar disorder, unspecified: Secondary | ICD-10-CM | POA: Diagnosis present

## 2019-07-13 DIAGNOSIS — Z3A28 28 weeks gestation of pregnancy: Secondary | ICD-10-CM

## 2019-07-13 DIAGNOSIS — O0932 Supervision of pregnancy with insufficient antenatal care, second trimester: Secondary | ICD-10-CM | POA: Diagnosis present

## 2019-07-13 DIAGNOSIS — O0933 Supervision of pregnancy with insufficient antenatal care, third trimester: Secondary | ICD-10-CM

## 2019-07-13 DIAGNOSIS — O34219 Maternal care for unspecified type scar from previous cesarean delivery: Secondary | ICD-10-CM | POA: Insufficient documentation

## 2019-07-13 DIAGNOSIS — O4440 Low lying placenta NOS or without hemorrhage, unspecified trimester: Secondary | ICD-10-CM | POA: Insufficient documentation

## 2019-07-13 DIAGNOSIS — O99213 Obesity complicating pregnancy, third trimester: Secondary | ICD-10-CM

## 2019-07-15 ENCOUNTER — Ambulatory Visit: Payer: Medicaid Other

## 2019-07-18 ENCOUNTER — Telehealth: Payer: Self-pay

## 2019-07-18 ENCOUNTER — Other Ambulatory Visit: Payer: Self-pay | Admitting: General Practice

## 2019-07-18 DIAGNOSIS — Z3493 Encounter for supervision of normal pregnancy, unspecified, third trimester: Secondary | ICD-10-CM

## 2019-07-18 NOTE — Telephone Encounter (Signed)
Called pt to go over [employer] & she stated that they have already contacted her.

## 2019-07-19 ENCOUNTER — Other Ambulatory Visit: Payer: Medicaid Other

## 2019-07-19 ENCOUNTER — Other Ambulatory Visit: Payer: Self-pay

## 2019-07-19 ENCOUNTER — Ambulatory Visit (INDEPENDENT_AMBULATORY_CARE_PROVIDER_SITE_OTHER): Payer: Medicaid Other | Admitting: Family Medicine

## 2019-07-19 ENCOUNTER — Encounter: Payer: Self-pay | Admitting: Family Medicine

## 2019-07-19 DIAGNOSIS — Z98891 History of uterine scar from previous surgery: Secondary | ICD-10-CM

## 2019-07-19 DIAGNOSIS — F5101 Primary insomnia: Secondary | ICD-10-CM | POA: Insufficient documentation

## 2019-07-19 DIAGNOSIS — Z23 Encounter for immunization: Secondary | ICD-10-CM

## 2019-07-19 DIAGNOSIS — O99343 Other mental disorders complicating pregnancy, third trimester: Secondary | ICD-10-CM

## 2019-07-19 DIAGNOSIS — O34219 Maternal care for unspecified type scar from previous cesarean delivery: Secondary | ICD-10-CM

## 2019-07-19 DIAGNOSIS — F319 Bipolar disorder, unspecified: Secondary | ICD-10-CM

## 2019-07-19 DIAGNOSIS — E669 Obesity, unspecified: Secondary | ICD-10-CM

## 2019-07-19 DIAGNOSIS — O99512 Diseases of the respiratory system complicating pregnancy, second trimester: Secondary | ICD-10-CM

## 2019-07-19 DIAGNOSIS — Z3A28 28 weeks gestation of pregnancy: Secondary | ICD-10-CM

## 2019-07-19 DIAGNOSIS — O99213 Obesity complicating pregnancy, third trimester: Secondary | ICD-10-CM

## 2019-07-19 DIAGNOSIS — Z3493 Encounter for supervision of normal pregnancy, unspecified, third trimester: Secondary | ICD-10-CM

## 2019-07-19 DIAGNOSIS — O365990 Maternal care for other known or suspected poor fetal growth, unspecified trimester, not applicable or unspecified: Secondary | ICD-10-CM

## 2019-07-19 DIAGNOSIS — J45909 Unspecified asthma, uncomplicated: Secondary | ICD-10-CM

## 2019-07-19 MED ORDER — ZOLPIDEM TARTRATE ER 6.25 MG PO TBCR: 6.2500 mg | EXTENDED_RELEASE_TABLET | Freq: Every evening | ORAL | 0 refills | Status: DC | PRN

## 2019-07-19 MED ORDER — BUDESONIDE-FORMOTEROL FUMARATE 160-4.5 MCG/ACT IN AERO: 2.0000 | INHALATION_SPRAY | Freq: Two times a day (BID) | RESPIRATORY_TRACT | 12 refills | Status: DC

## 2019-07-19 MED ORDER — BLOOD PRESSURE KIT DEVI: 1.0000 | Freq: Once | 0 refills | Status: AC

## 2019-07-19 MED ORDER — ALBUTEROL SULFATE HFA 108 (90 BASE) MCG/ACT IN AERS: 2.0000 | INHALATION_SPRAY | Freq: Four times a day (QID) | RESPIRATORY_TRACT | 3 refills | Status: DC | PRN

## 2019-07-19 NOTE — Progress Notes (Signed)
PRENATAL VISIT NOTE  Subjective:  Kathy Flores is a 29 y.o. [phone] at 89w6dbeing seen today for ongoing prenatal care.  She is currently monitored for the following issues for this high-risk pregnancy and has Normochromic normocytic anemia; Moderate persistent asthma with exacerbation; Asthma affecting pregnancy in second trimester; Supervision of low-risk pregnancy; Bipolar depression ([place]; History of cesarean section complicating pregnancy; Late prenatal care affecting pregnancy in second trimester; Obesity during pregnancy, antepartum; Primary insomnia; and Pregnancy affected by fetal growth restriction on their problem list.  Patient reports no complaints.  Contractions: Not present. Vag. Bleeding: None.  Movement: Present. Denies leaking of fluid.   The following portions of the patient's history were reviewed and updated as appropriate: allergies, current medications, past family history, past medical history, past social history, past surgical history and problem list.   Objective:   Vitals:   07/19/19 0819  BP: 117/64  Pulse: 83  Weight: 207 lb 9.6 oz (94.2 kg)    Fetal Status: Fetal Heart Rate (bpm): 145 Fundal Height: 27 cm Movement: Present     General:  Alert, oriented and cooperative. Patient is in no acute distress.  Skin: Skin is warm and dry. No rash noted.   Cardiovascular: Normal heart rate noted  Respiratory: Normal respiratory effort, no problems with respiration noted  Abdomen: Soft, gravid, appropriate for gestational age.  Pain/Pressure: Absent     Pelvic: Cervical exam deferred        Extremities: Normal range of motion.  Edema: None  Mental Status: Normal mood and affect. Normal behavior. Normal judgment and thought content.   Assessment and Plan:  Pregnancy: [id number] at 216w6d. History of cesarean section complicating pregnancy isk of uterine rupture at term is 0.78 percent with TOLAC and 0.22 percent with ERCD. 1 in 10 uterine ruptures will  result in neonatal death or neurological injury. The benefits of a trial of labor after cesarean (TOLAC) resulting in a vaginal birth after cesarean (VBAC) include the following: shorter length of hospital stay and postpartum recovery (in most cases); fewer complications, such as postpartum fever, wound or uterine infection, thromboembolism (blood clots in the leg or lung), need for blood transfusion and fewer neonatal breathing problems. The risks of an attempted VBAC or TOLAC include the following:  Risk of failed trial of labor after cesarean (TOLAC) without a vaginal birth after cesarean (VBAC) resulting in repeat cesarean delivery (RCD) in about 20 to 4020ercent of women who attempt VBAC.   Risk of rupture of uterus resulting in an emergency cesarean delivery. The risk of uterine rupture may be related in part to the type of uterine incision made during the first cesarean delivery. A previous transverse uterine incision has the lowest risk of rupture (0.2 to 1.5 percent risk). Vertical or T-shaped uterine incisions have a higher risk of uterine rupture (4 to 9 percent risk)The risk of fetal death is very low with both VBAC and elective repeat cesarean delivery (ERCD), but the likelihood of fetal death is higher with VBAC than with ERCD. Maternal death is very rare with either type of delivery. The risks of an elective repeat cesarean delivery (ERCD) were reviewed with the patient including but not limited to: 02/998 risk of uterine rupture which could have serious consequences, bleeding which may require transfusion; infection which may require antibiotics; injury to bowel, bladder or other surrounding organs (bowel, bladder, ureters); injury to the fetus; need for additional procedures including hysterectomy in the event of a life-threatening hemorrhage; thromboembolic  phenomenon; abnormal placentation; incisional problems; death and other postoperative or anesthesia complications.    These risks and  benefits are summarized on the consent form, which was reviewed with the patient during the visit.  All her questions answered and she signed a consent indicating a preference for TOLAC. A copy of the consent was given to the patient.  2. Asthma affecting pregnancy in second trimester Moderate persistent, has Breo--take BID--will send new rx for symbicort - budesonide-formoterol (SYMBICORT) 160-4.5 MCG/ACT inhaler; Inhale 2 puffs into the lungs in the morning and at bedtime.  Dispense: 1 Inhaler; Refill: 12 - albuterol (VENTOLIN HFA) 108 (90 Base) MCG/ACT inhaler; Inhale 2 puffs into the lungs every 6 (six) hours as needed for wheezing or shortness of breath.  Dispense: 18 g; Refill: 3  3. Encounter for supervision of low-risk pregnancy in third trimester Continue routine prenatal care. - Blood Pressure Monitoring (BLOOD PRESSURE KIT) DEVI; 1 Device by Does not apply route once for 1 dose.  Dispense: 1 each; Refill: 0  4. Bipolar depression ([place]) On topamax which is contraindicated in pregnancy and associated with IUGR--uses for sleep--will change to Ambien for now  5. Primary insomnia - zolpidem (AMBIEN CR) 6.25 MG CR tablet; Take 1 tablet (6.25 mg total) by mouth at bedtime as needed for sleep.  Dispense: 30 tablet; Refill: 0  6. IUGR U/S for growth this week Has normal fluid and dopplers.  Preterm labor symptoms and general obstetric precautions including but not limited to vaginal bleeding, contractions, leaking of fluid and fetal movement were reviewed in detail with the patient. Please refer to After Visit Summary for other counseling recommendations.   Return in 2 weeks (on 08/02/2019) for [facility], in person.  Future Appointments  Date Time Provider [place]  07/19/2019 10:00 AM [facility]  07/21/2019  8:45 AM [facility] NURSE [facility]  07/21/2019  8:45 AM [facility] US5 [facility]    Donnamae Jude, MD

## 2019-07-19 NOTE — Patient Instructions (Addendum)
 Breastfeeding  Choosing to breastfeed is one of the best decisions you can make for yourself and your baby. A change in hormones during pregnancy causes your breasts to make breast milk in your milk-producing glands. Hormones prevent breast milk from being released before your baby is born. They also prompt milk flow after birth. Once breastfeeding has begun, thoughts of your baby, as well as his or her sucking or crying, can stimulate the release of milk from your milk-producing glands. Benefits of breastfeeding Research shows that breastfeeding offers many health benefits for infants and mothers. It also offers a cost-free and convenient way to feed your baby. For your baby  Your first milk (colostrum) helps your baby's digestive system to function better.  Special cells in your milk (antibodies) help your baby to fight off infections.  Breastfed babies are less likely to develop asthma, allergies, obesity, or type 2 diabetes. They are also at lower risk for sudden infant death syndrome (SIDS).  Nutrients in breast milk are better able to meet your baby's needs compared to infant formula.  Breast milk improves your baby's brain development. For you  Breastfeeding helps to create a very special bond between you and your baby.  Breastfeeding is convenient. Breast milk costs nothing and is always available at the correct temperature.  Breastfeeding helps to burn calories. It helps you to lose the weight that you gained during pregnancy.  Breastfeeding makes your uterus return faster to its size before pregnancy. It also slows bleeding (lochia) after you give birth.  Breastfeeding helps to lower your risk of developing type 2 diabetes, osteoporosis, rheumatoid arthritis, cardiovascular disease, and breast, ovarian, uterine, and endometrial cancer later in life. Breastfeeding basics Starting breastfeeding  Find a comfortable place to sit or lie down, with your neck and back  well-supported.  Place a pillow or a rolled-up blanket under your baby to bring him or her to the level of your breast (if you are seated). Nursing pillows are specially designed to help support your arms and your baby while you breastfeed.  Make sure that your baby's tummy (abdomen) is facing your abdomen.  Gently massage your breast. With your fingertips, massage from the outer edges of your breast inward toward the nipple. This encourages milk flow. If your milk flows slowly, you may need to continue this action during the feeding.  Support your breast with 4 fingers underneath and your thumb above your nipple (make the letter "C" with your hand). Make sure your fingers are well away from your nipple and your baby's mouth.  Stroke your baby's lips gently with your finger or nipple.  When your baby's mouth is open wide enough, quickly bring your baby to your breast, placing your entire nipple and as much of the areola as possible into your baby's mouth. The areola is the colored area around your nipple. ? More areola should be visible above your baby's upper lip than below the lower lip. ? Your baby's lips should be opened and extended outward (flanged) to ensure an adequate, comfortable latch. ? Your baby's tongue should be between his or her lower gum and your breast.  Make sure that your baby's mouth is correctly positioned around your nipple (latched). Your baby's lips should create a seal on your breast and be turned out (everted).  It is common for your baby to suck about 2-3 minutes in order to start the flow of breast milk. Latching Teaching your baby how to latch onto your breast properly   is very important. An improper latch can cause nipple pain, decreased milk supply, and poor weight gain in your baby. Also, if your baby is not latched onto your nipple properly, he or she may swallow some air during feeding. This can make your baby fussy. Burping your baby when you switch breasts  during the feeding can help to get rid of the air. However, teaching your baby to latch on properly is still the best way to prevent fussiness from swallowing air while breastfeeding. Signs that your baby has successfully latched onto your nipple  Silent tugging or silent sucking, without causing you pain. Infant's lips should be extended outward (flanged).  Swallowing heard between every 3-4 sucks once your milk has started to flow (after your let-down milk reflex occurs).  Muscle movement above and in front of his or her ears while sucking. Signs that your baby has not successfully latched onto your nipple  Sucking sounds or smacking sounds from your baby while breastfeeding.  Nipple pain. If you think your baby has not latched on correctly, slip your finger into the corner of your baby's mouth to break the suction and place it between your baby's gums. Attempt to start breastfeeding again. Signs of successful breastfeeding Signs from your baby  Your baby will gradually decrease the number of sucks or will completely stop sucking.  Your baby will fall asleep.  Your baby's body will relax.  Your baby will retain a small amount of milk in his or her mouth.  Your baby will let go of your breast by himself or herself. Signs from you  Breasts that have increased in firmness, weight, and size 1-3 hours after feeding.  Breasts that are softer immediately after breastfeeding.  Increased milk volume, as well as a change in milk consistency and color by the fifth day of breastfeeding.  Nipples that are not sore, cracked, or bleeding. Signs that your baby is getting enough milk  Wetting at least 1-2 diapers during the first 24 hours after birth.  Wetting at least 5-6 diapers every 24 hours for the first week after birth. The urine should be clear or pale yellow by the age of 5 days.  Wetting 6-8 diapers every 24 hours as your baby continues to grow and develop.  At least 3 stools in  a 24-hour period by the age of 5 days. The stool should be soft and yellow.  At least 3 stools in a 24-hour period by the age of 7 days. The stool should be seedy and yellow.  No loss of weight greater than 10% of birth weight during the first 3 days of life.  Average weight gain of 4-7 oz (113-198 g) per week after the age of 4 days.  Consistent daily weight gain by the age of 5 days, without weight loss after the age of 2 weeks. After a feeding, your baby may spit up a small amount of milk. This is normal. Breastfeeding frequency and duration Frequent feeding will help you make more milk and can prevent sore nipples and extremely full breasts (breast engorgement). Breastfeed when you feel the need to reduce the fullness of your breasts or when your baby shows signs of hunger. This is called "breastfeeding on demand." Signs that your baby is hungry include:  Increased alertness, activity, or restlessness.  Movement of the head from side to side.  Opening of the mouth when the corner of the mouth or cheek is stroked (rooting).  Increased sucking sounds, smacking lips,   cooing, sighing, or squeaking.  Hand-to-mouth movements and sucking on fingers or hands.  Fussing or crying. Avoid introducing a pacifier to your baby in the first 4-6 weeks after your baby is born. After this time, you may choose to use a pacifier. Research has shown that pacifier use during the first year of a baby's life decreases the risk of sudden infant death syndrome (SIDS). Allow your baby to feed on each breast as long as he or she wants. When your baby unlatches or falls asleep while feeding from the first breast, offer the second breast. Because newborns are often sleepy in the first few weeks of life, you may need to awaken your baby to get him or her to feed. Breastfeeding times will vary from baby to baby. However, the following rules can serve as a guide to help you make sure that your baby is properly  fed:  Newborns (babies 4 weeks of age or younger) may breastfeed every 1-3 hours.  Newborns should not go without breastfeeding for longer than 3 hours during the day or 5 hours during the night.  You should breastfeed your baby a minimum of 8 times in a 24-hour period. Breast milk pumping     Pumping and storing breast milk allows you to make sure that your baby is exclusively fed your breast milk, even at times when you are unable to breastfeed. This is especially important if you go back to work while you are still breastfeeding, or if you are not able to be present during feedings. Your lactation consultant can help you find a method of pumping that works best for you and give you guidelines about how long it is safe to store breast milk. Caring for your breasts while you breastfeed Nipples can become dry, cracked, and sore while breastfeeding. The following recommendations can help keep your breasts moisturized and healthy:  Avoid using soap on your nipples.  Wear a supportive bra designed especially for nursing. Avoid wearing underwire-style bras or extremely tight bras (sports bras).  Air-dry your nipples for 3-4 minutes after each feeding.  Use only cotton bra pads to absorb leaked breast milk. Leaking of breast milk between feedings is normal.  Use lanolin on your nipples after breastfeeding. Lanolin helps to maintain your skin's normal moisture barrier. Pure lanolin is not harmful (not toxic) to your baby. You may also hand express a few drops of breast milk and gently massage that milk into your nipples and allow the milk to air-dry. In the first few weeks after giving birth, some women experience breast engorgement. Engorgement can make your breasts feel heavy, warm, and tender to the touch. Engorgement peaks within 3-5 days after you give birth. The following recommendations can help to ease engorgement:  Completely empty your breasts while breastfeeding or pumping. You may  want to start by applying warm, moist heat (in the shower or with warm, water-soaked hand towels) just before feeding or pumping. This increases circulation and helps the milk flow. If your baby does not completely empty your breasts while breastfeeding, pump any extra milk after he or she is finished.  Apply ice packs to your breasts immediately after breastfeeding or pumping, unless this is too uncomfortable for you. To do this: ? Put ice in a plastic bag. ? Place a towel between your skin and the bag. ? Leave the ice on for 20 minutes, 2-3 times a day.  Make sure that your baby is latched on and positioned properly while breastfeeding.   If engorgement persists after 48 hours of following these recommendations, contact your health care provider or a Science writer. Overall health care recommendations while breastfeeding  Eat 3 healthy meals and 3 snacks every day. Well-nourished mothers who are breastfeeding need an additional 450-500 calories a day. You can meet this requirement by increasing the amount of a balanced diet that you eat.  Drink enough water to keep your urine pale yellow or clear.  Rest often, relax, and continue to take your prenatal vitamins to prevent fatigue, stress, and low vitamin and mineral levels in your body (nutrient deficiencies).  Do not use any products that contain nicotine or tobacco, such as cigarettes and e-cigarettes. Your baby may be harmed by chemicals from cigarettes that pass into breast milk and exposure to secondhand smoke. If you need help quitting, ask your health care provider.  Avoid alcohol.  Do not use illegal drugs or marijuana.  Talk with your health care provider before taking any medicines. These include over-the-counter and prescription medicines as well as vitamins and herbal supplements. Some medicines that may be harmful to your baby can pass through breast milk.  It is possible to become pregnant while breastfeeding. If birth  control is desired, ask your health care provider about options that will be safe while breastfeeding your baby. Where to find more information: [employer]: www.llli.org Contact a health care provider if:  You feel like you want to stop breastfeeding or have become frustrated with breastfeeding.  Your nipples are cracked or bleeding.  Your breasts are red, tender, or warm.  You have: ? Painful breasts or nipples. ? A swollen area on either breast. ? A fever or chills. ? Nausea or vomiting. ? Drainage other than breast milk from your nipples.  Your breasts do not become full before feedings by the fifth day after you give birth.  You feel sad and depressed.  Your baby is: ? Too sleepy to eat well. ? Having trouble sleeping. ? More than 87 week old and wetting fewer than 6 diapers in a 24-hour period. ? Not gaining weight by 46 days of age.  Your baby has fewer than 3 stools in a 24-hour period.  Your baby's skin or the white parts of his or her eyes become yellow. Get help right away if:  Your baby is overly tired (lethargic) and does not want to wake up and feed.  Your baby develops an unexplained fever. Summary  Breastfeeding offers many health benefits for infant and mothers.  Try to breastfeed your infant when he or she shows early signs of hunger.  Gently tickle or stroke your baby's lips with your finger or nipple to allow the baby to open his or her mouth. Bring the baby to your breast. Make sure that much of the areola is in your baby's mouth. Offer one side and burp the baby before you offer the other side.  Talk with your health care provider or lactation consultant if you have questions or you face problems as you breastfeed. This information is not intended to replace advice given to you by your health care provider. Make sure you discuss any questions you have with your health care provider. Document Revised: 03/19/2017 Document Reviewed:  01/25/2016 Elsevier Patient Education  2020 [place] [phone]) . [facility] Davy Pique, MD; Gwendlyn Deutscher, MD; Walker Kehr, MD; Andria Frames, MD; McDiarmid, MD; Dutch Quint, MD; Nori Riis, MD; Mingo Amber, [place]  [address], [place], [place] [address] o [phone] o Mon-Fri 8:30-12:30, 1:30-5:00 o Providers come to see babies at [place] Medicaid . [place] [facility] [place] providers who accept newborns: Dorthy Cooler, MD; Orland Mustard, MD; Stephanie Acre, MD o [place], [place], [place] [address] o [phone] o Mon-Fri 8:00-5:30 o Babies seen by providers at [facility] o Does NOT accept Medicaid o Please call early in hospitalization for appointment (limited availability)  . Mustard Lester, MD o [address], [place], [place] [address] o [phone] o Mon, Tue, Thur, Fri 8:30-5:00, Wed 10:00-7:00 (closed 1-2pm) o Babies seen by [facility] providers o Accepting Medicaid . [place], MD o [place], [place], [place] [address] o [phone] o Mon-Fri 8:30-5:00, Sat 8:30-12:00 o Provider comes to see babies at [place] Medicaid o Must have been referred from current patients or contacted office prior to delivery . [place] [facility] ([place] for [place]) Franne Forts, MD; Tamera Punt, MD; Doneen Poisson, MD; Fatima Sanger, MD; Wynetta Emery, MD; Jess Barters, MD; Tami Ribas, MD; Herbert Moors, MD; Derrell Lolling, MD; Dorothyann Peng, MD; Lucious Groves, NP; Baldo Ash, NP o [address], [place], [place] [address] o [phone] o Mon, Tue, Thur, Fri 8:30-5:30, Wed 9:30-5:30, Sat 8:30-12:30 o Babies seen by [facility] providers o Accepting Medicaid o Only accepting infants of first-time parents or siblings of current patients [facility] discharge coordinator will  make follow-up appointment . Baltazar Najjar o [place] [address] [place], [place]  [address] o [phone]   Fax - [phone] . [facility] o [address], [place], [place]  [address] o Phone - [phone]   Fax [phone] . [place], [place], [place], [place]  [address] o [phone]  [place] [phone]) . [facility] Reginal Lutes, MD; Jacklynn Ganong, MD; Torrie Mayers, MD; MD; Rosana Hoes, MD; Servando Salina, MD; Rose Fillers, MD; Rex Kras, MD; Corinna Capra, MD; Volney American, MD; Trilby Drummer, MD; Janann Colonel, MD; Jimmye Norman, [place], [place], [place] [address] o [phone] o Mon-Fri 8:30-5:00 (extended evenings Mon-Thur as needed), Sat-Sun 10:00-1:00 o Providers come to see babies at [place] Medicaid for families of first-time babies and families with all children in the household age 74 and under. Must register with office prior to making appointment (M-F only). . [place], NP; Tomi Bamberger, MD; Redmond School, MD; [place], [address] [place], [place] [address] o [phone] o Mon-Fri 8:00-5:00 o Babies seen by providers at [facility] o Does NOT accept Medicaid/Commercial Insurance Only . [facility] [place] ([facility])  Marnee Guarneri, MD; Drema Dallas, MD; Montine Circle, MD; Vilma Prader, MD; Vanita Panda, MD; Alfonso Ramus, MD; Ruthann Cancer, MD; Roxanne Mins, MD; Rosalva Ferron, MD; Polly Cobia, MD o [address] [place], [place] [address] o [phone] o Mon-Fri 8:30-5:30, Sat (Oct.-Mar.) 9:00-1:00 o Babies seen by providers at [place] [phone]) . [facility] Peers, MD; Suzan Slick, MD o [place] [address], [place], [place] [address] o [phone] o Mon-Fri 8:30-5:00, Sat 8:30-12:00 o Providers come to see babies at [facility] o Does NOT accept Medicaid . [place] at [place], [place]; Mannie Stabile, MD;  [place], [place]; Nancy Fetter, MD; Moreen Fowler, [place], [place], [place] [address] o [phone] o Mon-Fri 8:00-5:00 o Babies seen by providers at [facility]  o Does NOT accept Medicaid o Only accepting babies of parents who are patients o Please call early in hospitalization for appointment (limited availability) . [facility] Pediatric[facility] Blanca Friend, MD; Sharlene Motts, MD; Rod Can, MD; Warner Mccreedy, NP; Sabra Heck, MD; Ermalinda Memos, MD; Sharlett Iles, NP; Aurther Loft, MD; Jerrye Beavers, MD; Marcello Moores, MD; Berline Lopes, MD; Charolette Forward, MD o [place]. [place], [place], [place] [address] o [phone] o Mon-Fri 8:00-5:00, Sat 9:00-12:00 o Providers come to see babies at [facility] o Does NOT accept [facility] [phone]) . [place] [facility] [place] providers accepting new patients: Dayna Ramus, NP; [place], [place], [place], [place] [address] o [phone] o Mon-Fri 8:00-5:00 o Babies seen by providers at [facility] o Does NOT accept Medicaid o Only accepting babies of parents who are patients o Please call early in hospitalization for appointment (limited availability) . [facility] Oswaldo Conroy, MD; Sheran Lawless, MD o [address] [place], [place] [address] o [phone] (press 1 to schedule appointment) o Mon-Fri 8:00-5:00 o Providers come to see babies at [facility] o Does NOT accept Medicaid . [facility] Jodi Mourning, MD o [address] [place], [place] [address] o [phone] o Mon-Fri 8:30-5:00 (lunch 12:30-1:00), extended hours by appointment only Wed 5:00-6:30 o Babies seen by [facility] providers o Accepting Medicaid . [place] at Evalyn Casco, MD; [place], MD; Ethlyn Gallery, MD o [place], [place], [place] [address] o [phone] o Mon-Fri 8:00-5:00 o Babies seen by [facility] providers o Does NOT accept Medicaid . Therapist, music at [place], MD; Yong Channel, MD; [place],  [place] [address] [place], [place] [address] o [phone] o Mon-Fri 8:00-5:00 o Babies seen by [facility] providers o Does NOT accept Medicaid . [place], [place]; [place], [place]; [place], NP; Albertina Parr, MD; Frederic Jericho, MD; Ronney Lion, MD; Carlos Levering, NP; Jerelene Redden, NP; Tomasita Crumble, NP; Ronelle Nigh, NP; Corinna Lines, MD; Cheval, MD o [address] [place], [place] [address] o [phone] o Mon-Fri 8:30-5:00, Sat 10:00-1:00 o Providers come to see babies at [facility] o Does NOT accept Medicaid o Free prenatal information session Tuesdays at 4:45pm . [facility] Porfirio Oar, MD; [place], [place]; [place], [place]; [place], [place]., [place] [address] o [phone] o Mon-Fri 7:30-5:30 o Babies seen by [facility] providers . [facility] Doctor o [address], [place], [place], [place]  [address] o [phone]   Fax - [phone]  [place] [phone] & [phone]) . [place], MD o [address] [place], [place] [address] o [phone] o Mon-Thur 8:00-6:00 o Providers come to see babies at [place] Medicaid . [place], NP; Melford Aase, MD; [place], [place]; [place], [place]., [place], [place] [address] o [phone] o Mon-Thur 7:30-7:30, Fri 7:30-4:30 o Babies seen by [facility] providers o Accepting Medicaid . [facility] Nyra Jabs, MD; Cristino Martes, NP; Gertie Baron, MD o [place] [address], [place],  [address] o [phone] o Mon-Fri 8:30-5:00, Sat 8:30-12:00 o Providers come to see babies at [place] Medicaid o Must have "Meet & Greet" appointment at office prior to delivery . [place] ([place]) Jodene Nam, MD; Juleen China, MD; Clydene Laming, [place] [address], [place],  [address] o [phone] o Mon-Wed 8:00-6:00, Thur-Fri  8:00-5:00, Sat 9:00-12:00 o Providers come to see babies at [facility]  o Does NOT accept Medicaid o Only accepting siblings of current patients . [facility] [place], [place], [place], [place]  [address] o [phone]   Fax [phone] . [place] at [place] [address] [place], [place]  [address] o [phone]   Fax - [place] [phone] & [phone]) . Therapist, music at [place], DO; [place], [place] [place], [place] [address] o [phone] o Mon-Fri 7:00-5:00 o Babies seen by [facility] providers o Does NOT accept Medicaid . [place], MD; [place], [place]; [place], [place], [place], [place] [address] o [phone] o Mon-Fri 8:00-5:00 o Babies seen by [facility] providers o Accepting Medicaid . [place], MD; [place], [place]; [place], NP; [place], [place], [place], [place] [address] o [phone] o Mon-Fri 8:00-5:00 o Babies seen by providers at [place] [phone]) . [facility] [place], [place] o [address] [place], [place] [address] o [phone] o Mon-Fri 8:00-5:00 o Babies seen by [facility] providers o Does NOT accept Medicaid o Limited availability, please call early in hospitalization to schedule follow-up . [facility] Leilani Merl, PA; Maisie Fus, MD; [place], MD; [place], [place]; Jeannine Kitten, MD; [place], [place] [facility] [address], [place], [place] [address] o [phone] o Mon-Fri 8:30-5:00, Sat 9:00-12:00 o Babies seen by providers at [facility] o Accepting Medicaid o Please register online then schedule online or call office o [email or web address] . [place] ([place] [facility] [place]) Kristian Covey, NP; Dwyane Dee, MD; Leonidas Romberg, PA o [address] [place], [place], [place] [address] o [phone] o Mon-Fri 8:00-5:00 o Babies seen by providers at [facility] o Accepting Medicaid . [place] ([facility] [employer]) Dairl Ponder, MD; Rayvon Char, NP; Melina Modena, MD o [address] [place], [place], [place] [address] o [phone] o Mon-Fri 8:00-5:30, Sat&Sun by appointment (phones open at 8:30) o Babies seen by [facility] providers o Accepting Medicaid o Must be a first-time baby or sibling of current patient . [place], [address], [place], [place]  [address] o [phone]   Fax - [phone]  [place] [phone] & [phone]) . [place], [place]; [place], [place]; Benjamine Mola, MD; [place], [place]; Harrell Lark, MD o [address] [place], [place] [address] o [phone] o Mon-Thur 8:00-7:00, Fri 8:00-5:00, Sat 8:00-12:00, Sun 9:00-12:00 o Babies seen by [facility] providers o Accepting Medicaid . [facility] at [facility], MD; Ruthann Cancer, MD; [facility], MD o [address], [place],  [address] o [phone] o Mon-Thur 8:00-5:00 o Babies seen by providers at [facility] o Accepting Medicaid . [facility] at [place], MD; Coe-Goins, MD; Amedeo Plenty, MD; Bobby Rumpf, MD; List, MD; Lavonia Drafts, MD; Ruthann Cancer, MD; Selinda Eon, MD; Audie Box, MD; Jim Like, MD; Christie Nottingham, MD; Hubbard Hartshorn, MD; Modena Nunnery, MD o [address] [place],  [address] o [phone] o Mon-Fri 8:00-5:30, Sat (Oct.-Mar.) 9:00-1:00 o Babies seen by providers at [facility] o Accepting Medicaid o Must fill out new patient packet, available online at [email or web address] . [facility]  Pediatrics - Seward Speck ([facility]) Barnabas Lister,  NP; Kenton Kingfisher, NP; Claiborne Billings, NP; Rolla Plate, MD; [place], [place]; Carola Rhine, MD; Tyron Russell, MD; Delia Chimes, NP o [address], [place], [place] [address] o [phone] o Mon-Thur 8:00-5:30, Fri 8:00-5:00 o Babies seen by providers at [facility] o Accepting Medicaid

## 2019-07-19 NOTE — Addendum Note (Signed)
Addended by: Louisa Second E on: 07/19/2019 12:32 PM   Modules accepted: Orders

## 2019-07-20 LAB — CBC
Hematocrit: 32.5 % — ABNORMAL LOW (ref 34.0–46.6)
Hemoglobin: 10.9 g/dL — ABNORMAL LOW (ref 11.1–15.9)
MCH: 27.8 pg (ref 26.6–33.0)
MCHC: 33.5 g/dL (ref 31.5–35.7)
MCV: 83 fL (ref 79–97)
Platelets: 257 10*3/uL (ref 150–450)
RBC: 3.92 x10E6/uL (ref 3.77–5.28)
RDW: 13.4 % (ref 11.7–15.4)
WBC: 7.6 10*3/uL (ref 3.4–10.8)

## 2019-07-20 LAB — RPR: RPR Ser Ql: NONREACTIVE

## 2019-07-20 LAB — GLUCOSE TOLERANCE, 2 HOURS W/ 1HR
Glucose, 1 hour: 125 mg/dL (ref 65–179)
Glucose, 2 hour: 107 mg/dL (ref 65–152)
Glucose, Fasting: 80 mg/dL (ref 65–91)

## 2019-07-20 LAB — HIV ANTIBODY (ROUTINE TESTING W REFLEX): HIV Screen 4th Generation wRfx: NONREACTIVE

## 2019-07-21 ENCOUNTER — Ambulatory Visit: Payer: Medicaid Other | Admitting: *Deleted

## 2019-07-21 ENCOUNTER — Ambulatory Visit: Payer: Medicaid Other | Attending: Obstetrics and Gynecology

## 2019-07-21 ENCOUNTER — Other Ambulatory Visit: Payer: Self-pay | Admitting: *Deleted

## 2019-07-21 ENCOUNTER — Other Ambulatory Visit: Payer: Self-pay

## 2019-07-21 ENCOUNTER — Telehealth (INDEPENDENT_AMBULATORY_CARE_PROVIDER_SITE_OTHER): Payer: Medicaid Other | Admitting: Family Medicine

## 2019-07-21 DIAGNOSIS — O09293 Supervision of pregnancy with other poor reproductive or obstetric history, third trimester: Secondary | ICD-10-CM | POA: Diagnosis not present

## 2019-07-21 DIAGNOSIS — O34219 Maternal care for unspecified type scar from previous cesarean delivery: Secondary | ICD-10-CM | POA: Diagnosis present

## 2019-07-21 DIAGNOSIS — F319 Bipolar disorder, unspecified: Secondary | ICD-10-CM | POA: Insufficient documentation

## 2019-07-21 DIAGNOSIS — O0932 Supervision of pregnancy with insufficient antenatal care, second trimester: Secondary | ICD-10-CM | POA: Diagnosis present

## 2019-07-21 DIAGNOSIS — Z3A29 29 weeks gestation of pregnancy: Secondary | ICD-10-CM

## 2019-07-21 DIAGNOSIS — Z362 Encounter for other antenatal screening follow-up: Secondary | ICD-10-CM

## 2019-07-21 DIAGNOSIS — O0933 Supervision of pregnancy with insufficient antenatal care, third trimester: Secondary | ICD-10-CM | POA: Diagnosis not present

## 2019-07-21 DIAGNOSIS — O365930 Maternal care for other known or suspected poor fetal growth, third trimester, not applicable or unspecified: Secondary | ICD-10-CM

## 2019-07-21 DIAGNOSIS — O99213 Obesity complicating pregnancy, third trimester: Secondary | ICD-10-CM

## 2019-07-21 DIAGNOSIS — Z5309 Procedure and treatment not carried out because of other contraindication: Secondary | ICD-10-CM

## 2019-07-21 DIAGNOSIS — E669 Obesity, unspecified: Secondary | ICD-10-CM

## 2019-07-21 NOTE — Telephone Encounter (Signed)
Patient is requesting a call back from the nurse about her medication. She stated her medicaid won't cover her medication prescribed her.

## 2019-07-22 NOTE — Telephone Encounter (Signed)
Called patient regarding medications and asked which ones were not covered. She states she doesn't know. Called patient's pharmacy and they state she doesn't have insurance. Told patient's pharmacy patient does have [employer]. They state patient needs to bring in medicaid card. Called & discussed with patient. Patient verbalized understanding & states her pharmacy already has her medicaid card. She was told she has FP planning but she doesn't she has pregnancy medicaid. Told patient I would reach out to her case manager and the pharmacy. Patient verbalized understanding.

## 2019-08-03 ENCOUNTER — Ambulatory Visit (INDEPENDENT_AMBULATORY_CARE_PROVIDER_SITE_OTHER): Payer: Medicaid Other | Admitting: Family Medicine

## 2019-08-03 ENCOUNTER — Other Ambulatory Visit: Payer: Self-pay

## 2019-08-03 VITALS — BP 113/62 | HR 101 | Wt 209.1 lb

## 2019-08-03 DIAGNOSIS — J4540 Moderate persistent asthma, uncomplicated: Secondary | ICD-10-CM

## 2019-08-03 DIAGNOSIS — F319 Bipolar disorder, unspecified: Secondary | ICD-10-CM

## 2019-08-03 DIAGNOSIS — O99891 Other specified diseases and conditions complicating pregnancy: Secondary | ICD-10-CM | POA: Diagnosis not present

## 2019-08-03 DIAGNOSIS — O365990 Maternal care for other known or suspected poor fetal growth, unspecified trimester, not applicable or unspecified: Secondary | ICD-10-CM

## 2019-08-03 DIAGNOSIS — O99512 Diseases of the respiratory system complicating pregnancy, second trimester: Secondary | ICD-10-CM | POA: Diagnosis not present

## 2019-08-03 DIAGNOSIS — O34219 Maternal care for unspecified type scar from previous cesarean delivery: Secondary | ICD-10-CM

## 2019-08-03 DIAGNOSIS — Z3493 Encounter for supervision of normal pregnancy, unspecified, third trimester: Secondary | ICD-10-CM

## 2019-08-03 DIAGNOSIS — J45909 Unspecified asthma, uncomplicated: Secondary | ICD-10-CM

## 2019-08-03 DIAGNOSIS — M549 Dorsalgia, unspecified: Secondary | ICD-10-CM

## 2019-08-03 DIAGNOSIS — Z3A31 31 weeks gestation of pregnancy: Secondary | ICD-10-CM

## 2019-08-03 DIAGNOSIS — F5101 Primary insomnia: Secondary | ICD-10-CM

## 2019-08-03 MED ORDER — CYCLOBENZAPRINE HCL 10 MG PO TABS: 10.0000 mg | ORAL_TABLET | Freq: Two times a day (BID) | ORAL | 1 refills | Status: DC | PRN

## 2019-08-03 MED ORDER — ZOLPIDEM TARTRATE ER 6.25 MG PO TBCR: 6.2500 mg | EXTENDED_RELEASE_TABLET | Freq: Every evening | ORAL | 0 refills | Status: DC | PRN

## 2019-08-03 NOTE — Progress Notes (Signed)
PRENATAL VISIT NOTE  Subjective:  Kathy Flores is a 29 y.o. [phone] at [redacted]w[redacted]d being seen today for ongoing prenatal care.  She is currently monitored for the following issues for this low-risk pregnancy and has Normochromic normocytic anemia; Moderate persistent asthma with exacerbation; Asthma affecting pregnancy in second trimester; Supervision of low-risk pregnancy; Bipolar depression ([place]); History of cesarean section complicating pregnancy; Late prenatal care affecting pregnancy in second trimester; Obesity during pregnancy, antepartum; Primary insomnia; and Pregnancy affected by fetal growth restriction on their problem list.  Patient reports no complaints.  Contractions: Not present. Vag. Bleeding: None.  Movement: Present. Denies leaking of fluid.   The following portions of the patient's history were reviewed and updated as appropriate: allergies, current medications, past family history, past medical history, past social history, past surgical history and problem list.   Objective:   Vitals:   08/03/19 1120  BP: (!) 113/62  Pulse: 101  Weight: (!) 209 lb 1.6 oz (94.8 kg)    Fetal Status: Fetal Heart Rate (bpm): 143 Fundal Height: 31 cm Movement: Present     General:  Alert, oriented and cooperative. Patient is in no acute distress.  Skin: Skin is warm and dry. No rash noted.   Cardiovascular: Normal heart rate noted  Respiratory: Normal respiratory effort, no problems with respiration noted  Abdomen: Soft, gravid, appropriate for gestational age.  Pain/Pressure: Present     Pelvic: Cervical exam deferred        Extremities: Normal range of motion.  Edema: None  Mental Status: Normal mood and affect. Normal behavior. Normal judgment and thought content.   Assessment and Plan:  Pregnancy: E3P2951 at [redacted]w[redacted]d Kathy Flores was seen today for routine prenatal visit.  Diagnoses and all orders for this visit:  Encounter for supervision of low-risk pregnancy in third trimester        -  RTC in 2 weeks  Asthma affecting pregnancy in second trimester       - Only using Symbicort maybe every other day but not having to use albuterol   History of cesarean section complicating pregnancy       - 2 prior vaginal deliveries and then a c-section for Chorio, failure to progress, fetal intolerance       - Desires VBAC and consent signed last visit  Bipolar depression ([place])       - Reports mood has been stable  Primary insomnia -     zolpidem (AMBIEN CR) 6.25 MG CR tablet; Take 1 tablet (6.25 mg total) by mouth at bedtime as needed for sleep.  Pregnancy affected by fetal growth restriction       - Resolved with 11% on last [place]; f/u scheduled   Back pain affecting pregnancy in second trimester -     cyclobenzaprine (FLEXERIL) 10 MG tablet; Take 1 tablet (10 mg total) by mouth 2 (two) times daily as needed for muscle spasms.   Preterm labor symptoms and general obstetric precautions including but not limited to vaginal bleeding, contractions, leaking of fluid and fetal movement were reviewed in detail with the patient. Please refer to After Visit Summary for other counseling recommendations.   Return in about 2 weeks (around 08/17/2019) for [facility]; in-person or virtual per patient request.  Future Appointments  Date Time Provider [place]  08/11/2019  8:45 AM [facility] NURSE [facility]  08/11/2019  9:00 AM [facility] US1 [facility]  08/11/2019 10:35 AM Aletha Halim, MD [facility]    Chauncey Mann, MD

## 2019-08-04 ENCOUNTER — Encounter: Payer: Self-pay | Admitting: *Deleted

## 2019-08-11 ENCOUNTER — Ambulatory Visit: Payer: Medicaid Other | Admitting: *Deleted

## 2019-08-11 ENCOUNTER — Other Ambulatory Visit: Payer: Self-pay | Admitting: *Deleted

## 2019-08-11 ENCOUNTER — Ambulatory Visit: Payer: Medicaid Other | Attending: Obstetrics and Gynecology

## 2019-08-11 ENCOUNTER — Encounter: Payer: Self-pay | Admitting: *Deleted

## 2019-08-11 ENCOUNTER — Ambulatory Visit (INDEPENDENT_AMBULATORY_CARE_PROVIDER_SITE_OTHER): Payer: Medicaid Other | Admitting: Obstetrics and Gynecology

## 2019-08-11 ENCOUNTER — Other Ambulatory Visit: Payer: Self-pay

## 2019-08-11 VITALS — BP 112/78 | HR 97 | Wt 210.3 lb

## 2019-08-11 DIAGNOSIS — O34219 Maternal care for unspecified type scar from previous cesarean delivery: Secondary | ICD-10-CM

## 2019-08-11 DIAGNOSIS — O0932 Supervision of pregnancy with insufficient antenatal care, second trimester: Secondary | ICD-10-CM

## 2019-08-11 DIAGNOSIS — O09293 Supervision of pregnancy with other poor reproductive or obstetric history, third trimester: Secondary | ICD-10-CM

## 2019-08-11 DIAGNOSIS — O99213 Obesity complicating pregnancy, third trimester: Secondary | ICD-10-CM | POA: Diagnosis not present

## 2019-08-11 DIAGNOSIS — F319 Bipolar disorder, unspecified: Secondary | ICD-10-CM

## 2019-08-11 DIAGNOSIS — O365930 Maternal care for other known or suspected poor fetal growth, third trimester, not applicable or unspecified: Secondary | ICD-10-CM

## 2019-08-11 DIAGNOSIS — O99210 Obesity complicating pregnancy, unspecified trimester: Secondary | ICD-10-CM

## 2019-08-11 DIAGNOSIS — O0990 Supervision of high risk pregnancy, unspecified, unspecified trimester: Secondary | ICD-10-CM

## 2019-08-11 DIAGNOSIS — Z3493 Encounter for supervision of normal pregnancy, unspecified, third trimester: Secondary | ICD-10-CM | POA: Diagnosis present

## 2019-08-11 DIAGNOSIS — Z362 Encounter for other antenatal screening follow-up: Secondary | ICD-10-CM

## 2019-08-11 DIAGNOSIS — E669 Obesity, unspecified: Secondary | ICD-10-CM

## 2019-08-11 DIAGNOSIS — Z8759 Personal history of other complications of pregnancy, childbirth and the puerperium: Secondary | ICD-10-CM

## 2019-08-11 DIAGNOSIS — J45909 Unspecified asthma, uncomplicated: Secondary | ICD-10-CM

## 2019-08-11 DIAGNOSIS — O283 Abnormal ultrasonic finding on antenatal screening of mother: Secondary | ICD-10-CM

## 2019-08-11 DIAGNOSIS — O99513 Diseases of the respiratory system complicating pregnancy, third trimester: Secondary | ICD-10-CM

## 2019-08-11 DIAGNOSIS — O365990 Maternal care for other known or suspected poor fetal growth, unspecified trimester, not applicable or unspecified: Secondary | ICD-10-CM

## 2019-08-11 DIAGNOSIS — Z3A32 32 weeks gestation of pregnancy: Secondary | ICD-10-CM

## 2019-08-11 NOTE — Progress Notes (Signed)
Prenatal Visit Note Date: 08/11/2019 Clinic: [facility]  Subjective:  Kathy Flores is a 29 y.o. [phone] at [redacted]w[redacted]d being seen today for ongoing prenatal care.  She is currently monitored for the following issues for this high-risk pregnancy and has Normochromic normocytic anemia; Moderate persistent asthma with exacerbation; Asthma affecting pregnancy in third trimester; Supervision of high risk pregnancy, antepartum; Bipolar depression ([place]); History of cesarean section complicating pregnancy; Late prenatal care affecting pregnancy in second trimester; Obesity during pregnancy, antepartum; and Primary insomnia on their problem list.  Patient reports no complaints.   Contractions: Not present. Vag. Bleeding: None.  Movement: Present. Denies leaking of fluid.   The following portions of the patient's history were reviewed and updated as appropriate: allergies, current medications, past family history, past medical history, past social history, past surgical history and problem list. Problem list updated.  Objective:   Vitals:   08/11/19 1036  BP: 112/78  Pulse: 97  Weight: 210 lb 4.8 oz (95.4 kg)    Fetal Status: Fetal Heart Rate (bpm): 150   Movement: Present     General:  Alert, oriented and cooperative. Patient is in no acute distress.  Skin: Skin is warm and dry. No rash noted.   Cardiovascular: Normal heart rate noted  Respiratory: Normal respiratory effort, no problems with respiration noted  Abdomen: Soft, gravid, appropriate for gestational age. Pain/Pressure: Present     Pelvic:  Cervical exam deferred        Extremities: Normal range of motion.  Edema: None  Mental Status: Normal mood and affect. Normal behavior. Normal judgment and thought content.   Urinalysis:      Assessment and Plan:  Pregnancy: G4P3003 at [redacted]w[redacted]d  1. Asthma affecting pregnancy in third trimester Continue symbicort  2. Supervision of high risk pregnancy,  antepartum OCPs  3. History of cesarean section complicating pregnancy Desires tolac. Form already signed. H/o two prior SVDs  4. Obesity during pregnancy, antepartum  5. Pregnancy affected by fetal growth restriction Resolved today  6. Abnormal fetal ultrasound Gallstones and ?pericardial effusion. D/w pt to let peds know at delivery  Preterm labor symptoms and general obstetric precautions including but not limited to vaginal bleeding, contractions, leaking of fluid and fetal movement were reviewed in detail with the patient. Please refer to After Visit Summary for other counseling recommendations.  Return in about 2 weeks (around 08/25/2019) for high risk, in person.   Aletha Halim, MD

## 2019-08-11 NOTE — Progress Notes (Signed)
.  [facility]

## 2019-08-13 ENCOUNTER — Other Ambulatory Visit: Payer: Self-pay

## 2019-08-13 ENCOUNTER — Encounter (HOSPITAL_COMMUNITY): Payer: Self-pay | Admitting: Obstetrics & Gynecology

## 2019-08-13 ENCOUNTER — Inpatient Hospital Stay (HOSPITAL_COMMUNITY)
Admission: AD | Admit: 2019-08-13 | Discharge: 2019-08-13 | Disposition: A | Discharge status: Home / Self Care | Payer: Medicaid Other | Attending: Obstetrics & Gynecology | Admitting: Obstetrics & Gynecology

## 2019-08-13 DIAGNOSIS — Z711 Person with feared health complaint in whom no diagnosis is made: Secondary | ICD-10-CM

## 2019-08-13 DIAGNOSIS — Z881 Allergy status to other antibiotic agents status: Secondary | ICD-10-CM | POA: Diagnosis not present

## 2019-08-13 DIAGNOSIS — R519 Headache, unspecified: Secondary | ICD-10-CM | POA: Insufficient documentation

## 2019-08-13 DIAGNOSIS — Z3689 Encounter for other specified antenatal screening: Secondary | ICD-10-CM

## 2019-08-13 DIAGNOSIS — Z79899 Other long term (current) drug therapy: Secondary | ICD-10-CM | POA: Diagnosis not present

## 2019-08-13 DIAGNOSIS — Z8759 Personal history of other complications of pregnancy, childbirth and the puerperium: Secondary | ICD-10-CM | POA: Diagnosis not present

## 2019-08-13 DIAGNOSIS — R030 Elevated blood-pressure reading, without diagnosis of hypertension: Secondary | ICD-10-CM | POA: Insufficient documentation

## 2019-08-13 DIAGNOSIS — Z87891 Personal history of nicotine dependence: Secondary | ICD-10-CM | POA: Insufficient documentation

## 2019-08-13 DIAGNOSIS — O09293 Supervision of pregnancy with other poor reproductive or obstetric history, third trimester: Secondary | ICD-10-CM

## 2019-08-13 DIAGNOSIS — Z7951 Long term (current) use of inhaled steroids: Secondary | ICD-10-CM | POA: Insufficient documentation

## 2019-08-13 DIAGNOSIS — O26893 Other specified pregnancy related conditions, third trimester: Secondary | ICD-10-CM | POA: Diagnosis not present

## 2019-08-13 DIAGNOSIS — Z3A32 32 weeks gestation of pregnancy: Secondary | ICD-10-CM

## 2019-08-13 DIAGNOSIS — Z7982 Long term (current) use of aspirin: Secondary | ICD-10-CM | POA: Diagnosis not present

## 2019-08-13 DIAGNOSIS — Z880 Allergy status to penicillin: Secondary | ICD-10-CM | POA: Insufficient documentation

## 2019-08-13 LAB — URINALYSIS, ROUTINE W REFLEX MICROSCOPIC
Bacteria, UA: NONE SEEN
Bilirubin Urine: NEGATIVE
Glucose, UA: NEGATIVE mg/dL
Ketones, ur: NEGATIVE mg/dL
Nitrite: NEGATIVE
Protein, ur: NEGATIVE mg/dL
Specific Gravity, Urine: 1.009 (ref 1.005–1.030)
WBC, UA: >50 WBC/hpf — ABNORMAL HIGH (ref 0–5)
pH: 5 (ref 5.0–8.0)

## 2019-08-13 LAB — COMPREHENSIVE METABOLIC PANEL
ALT: 11 U/L (ref 0–44)
AST: 17 U/L (ref 15–41)
Albumin: 2.6 g/dL — ABNORMAL LOW (ref 3.5–5.0)
Alkaline Phosphatase: 70 U/L (ref 38–126)
Anion gap: 12 (ref 5–15)
BUN: <5 mg/dL — ABNORMAL LOW (ref 6–20)
CO2: 22 mmol/L (ref 22–32)
Calcium: 9.7 mg/dL (ref 8.9–10.3)
Chloride: 101 mmol/L (ref 98–111)
Creatinine, Ser: 0.74 mg/dL (ref 0.44–1.00)
GFR calc Af Amer: >60 mL/min (ref >60)
GFR calc non Af Amer: >60 mL/min (ref >60)
Glucose, Bld: 93 mg/dL (ref 70–99)
Potassium: 3.8 mmol/L (ref 3.5–5.1)
Sodium: 135 mmol/L (ref 135–145)
Total Bilirubin: 0.4 mg/dL (ref 0.3–1.2)
Total Protein: 6.1 g/dL — ABNORMAL LOW (ref 6.5–8.1)

## 2019-08-13 LAB — CBC
HCT: 33.3 % — ABNORMAL LOW (ref 36.0–46.0)
Hemoglobin: 10.7 g/dL — ABNORMAL LOW (ref 12.0–15.0)
MCH: 27.3 pg (ref 26.0–34.0)
MCHC: 32.1 g/dL (ref 30.0–36.0)
MCV: 84.9 fL (ref 80.0–100.0)
Platelets: 253 10*3/uL (ref 150–400)
RBC: 3.92 MIL/uL (ref 3.87–5.11)
RDW: 13.5 % (ref 11.5–15.5)
WBC: 9.2 10*3/uL (ref 4.0–10.5)
nRBC: 0 % (ref 0.0–0.2)

## 2019-08-13 LAB — PROTEIN / CREATININE RATIO, URINE
Creatinine, Urine: 91.18 mg/dL
Protein Creatinine Ratio: 0.14 mg/mg{Cre} (ref 0.00–0.15)
Total Protein, Urine: 13 mg/dL

## 2019-08-13 NOTE — MAU Provider Note (Signed)
Chief Complaint:  Headache and Hypertension   First Provider Initiated Contact with Patient 08/13/19 1535     HPI: Kathy Flores is a 29 y.o. G4P3003 at [redacted]w[redacted]d who presents to maternity admissions reporting headaches. Has history of headaches and migraines. Normally does not treat them. Reports that she had a headache last night. Headache resolved without intervention. While she had the headache, she had elevated BPs at home of 138/90, 156/80, & 141/78. Reports hx of preeclampsia in a previous pregnancy but otherwise denies hypertension history. Reports no headache today. Denies visual disturbance or epigastric pain. No ob complaints. Good fetal movement.   Pregnancy Course: [facility]. Hx preeclampsia  Past Medical History:  Diagnosis Date   Asthma    inhaler used 3x month   Depression    Fibroid    Trichomonas infection    Vaginal Pap smear, abnormal    OB History  Gravida Para Term Preterm AB Living  4 3 3     3   SAB TAB Ectopic Multiple Live Births        0 3    # Outcome Date GA Lbr Len/2nd Weight Sex Delivery Anes PTL Lv  4 Current           3 Term 11/23/13 107w1d  3485 g F CS-LTranv EPI  LIV     Complications: Chorioamnionitis  2 Term 01/30/06 [redacted]w[redacted]d  2268 g F Vag-Spont   LIV  1 Term 11/13/04 [redacted]w[redacted]d  2268 g F Vag-Spont   LIV   Past Surgical History:  Procedure Laterality Date   CESAREAN SECTION N/A 11/23/2013   Procedure: CESAREAN SECTION;  Surgeon: Woodroe Mode, MD;  Location: [facility];  Service: Obstetrics;  Laterality: N/A;   Family History  Problem Relation Age of Onset   Asthma Sister    Healthy Mother    Healthy Father    Social History   Tobacco Use   Smoking status: Former Smoker   Smokeless tobacco: Never Used  Scientific laboratory technician Use: Never used  Substance Use Topics   Alcohol use: No   Drug use: No   Allergies  Allergen Reactions   Keflet [Cephalexin] Itching   Penicillins Hives and Rash    Has patient had a PCN reaction causing  immediate rash, facial/tongue/throat swelling, SOB or lightheadedness with hypotension: Yes Has patient had a PCN reaction causing severe rash involving mucus membranes or skin necrosis: No Has patient had a PCN reaction that required hospitalization No Has patient had a PCN reaction occurring within the last 10 years: No If all of the above answers are "NO", then may proceed with Cephalosporin use.    Medications Prior to Admission  Medication Sig Dispense Refill Last Dose   albuterol (VENTOLIN HFA) 108 (90 Base) MCG/ACT inhaler Inhale 2 puffs into the lungs every 6 (six) hours as needed for wheezing or shortness of breath. 18 g 3    aspirin EC 81 MG tablet Take 1 tablet (81 mg total) by mouth daily. Take after 12 weeks for prevention of preeclampsia later in pregnancy 300 tablet 0    budesonide-formoterol (SYMBICORT) 160-4.5 MCG/ACT inhaler Inhale 2 puffs into the lungs in the morning and at bedtime. 1 Inhaler 12    cyclobenzaprine (FLEXERIL) 10 MG tablet Take 1 tablet (10 mg total) by mouth 2 (two) times daily as needed for muscle spasms. 20 tablet 1    Elastic Bandages & Supports (COMFORT FIT MATERNITY SUPP LG) MISC 1 Units by Does not apply route  daily. 1 each 0    fluticasone (FLONASE) 50 MCG/ACT nasal spray Place 2 sprays into both nostrils daily. 11.1 mL 0    Prenatal Vit-Fe Fumarate-FA (PRENATAL VITAMIN) 27-0.8 MG TABS Take 1 tablet by mouth daily. 30 tablet 12    traZODone (DESYREL) 100 MG tablet Take 100 mg by mouth at bedtime as needed. (Patient not taking: Reported on 08/03/2019)      traZODone (DESYREL) 50 MG tablet Take 50 mg by mouth at bedtime.  (Patient not taking: Reported on 08/03/2019)      zolpidem (AMBIEN CR) 6.25 MG CR tablet Take 1 tablet (6.25 mg total) by mouth at bedtime as needed for sleep. 30 tablet 0     I have reviewed patient's Past Medical Hx, Surgical Hx, Family Hx, Social Hx, medications and allergies.   ROS:  Review of Systems  Constitutional:  Negative.   Eyes: Negative for visual disturbance.  Gastrointestinal: Negative.   Genitourinary: Negative.   Neurological: Negative for headaches.    Physical Exam   Patient Vitals for the past 24 hrs:  BP Temp Temp src Pulse Resp SpO2 Height Weight  08/13/19 1545 107/64 -- -- 93 -- 99 % -- --  08/13/19 1530 115/64 -- -- 97 -- 99 % -- --  08/13/19 1515 112/65 -- -- (!) 105 -- 98 % -- --  08/13/19 1507 117/74 -- -- (!) 106 -- -- -- --  08/13/19 1451 123/67 98.8 F (37.1 C) Oral (!) 108 17 99 % -- --  08/13/19 1447 -- -- -- -- -- -- 5\' 2"  (1.575 m) 95.9 kg    Constitutional: Well-developed, well-nourished female in no acute distress.  Cardiovascular: normal rate & rhythm, no murmur Respiratory: normal effort, lung sounds clear throughout GI: Abd soft, non-tender, gravid appropriate for gestational age. Pos BS x 4 MS: Extremities nontender, no edema, normal ROM Neurologic: Alert and oriented x 4.   Fetal Tracing:  Baseline: 145 Variability: moderate Accelerations: 15x15 Decelerations: none  Toco: none   Labs: Results for orders placed or performed during the hospital encounter of 08/13/19 (from the past 24 hour(s))  Urinalysis, Routine w reflex microscopic Urine, Clean Catch     Status: Abnormal   Collection Time: 08/13/19  2:52 PM  Result Value Ref Range   Color, Urine YELLOW YELLOW   APPearance CLOUDY (A) CLEAR   Specific Gravity, Urine 1.009 1.005 - 1.030   pH 5.0 5.0 - 8.0   Glucose, UA NEGATIVE NEGATIVE mg/dL   Hgb urine dipstick SMALL (A) NEGATIVE   Bilirubin Urine NEGATIVE NEGATIVE   Ketones, ur NEGATIVE NEGATIVE mg/dL   Protein, ur NEGATIVE NEGATIVE mg/dL   Nitrite NEGATIVE NEGATIVE   Leukocytes,Ua LARGE (A) NEGATIVE   RBC / HPF 0-5 0 - 5 RBC/hpf   WBC, UA >50 (H) 0 - 5 WBC/hpf   Bacteria, UA NONE SEEN NONE SEEN   Squamous Epithelial / LPF 0-5 0 - 5   WBC Clumps PRESENT    Mucus PRESENT     Imaging:  No results found.  MAU Course: Orders Placed  This Encounter  Procedures   Urinalysis, Routine w reflex microscopic Urine, Clean Catch   CBC   Comprehensive metabolic panel   Protein / creatinine ratio, urine   Discharge patient   No orders of the defined types were placed in this encounter.   MDM: Patient is normotensive in MAU & in office per review of records. She has not symptoms and denies headache today. Yesterday's headache resolved without  intervention. Hx of preeclampsia in previous pregnancy per patient; baseline labs do not appear to have been done; will collect today since patient presents with elevations at home.   Discussed treatment of future headaches, BP parameters, entering BP into babyscripts, and reasons to return to MAU  U/a with leuks & bacteria. No urinary complaints. Will culture.   Assessment: 1. Physically well but worried   2. [redacted] weeks gestation of pregnancy   3. NST (non-stress test) reactive   4. Hx of preeclampsia, prior pregnancy, currently pregnant, third trimester     Plan: Discharge home in stable condition.  Preeclampsia labs pending Urine culture pending Reviewed reasons to return to MAU    Allergies as of 08/13/2019      Reactions   Keflet [cephalexin] Itching   Penicillins Hives, Rash   Has patient had a PCN reaction causing immediate rash, facial/tongue/throat swelling, SOB or lightheadedness with hypotension: Yes Has patient had a PCN reaction causing severe rash involving mucus membranes or skin necrosis: No Has patient had a PCN reaction that required hospitalization No Has patient had a PCN reaction occurring within the last 10 years: No If all of the above answers are "NO", then may proceed with Cephalosporin use.      Medication List    STOP taking these medications   traZODone 100 MG tablet Commonly known as: DESYREL   traZODone 50 MG tablet Commonly known as: DESYREL     TAKE these medications   albuterol 108 (90 Base) MCG/ACT inhaler Commonly known as:  VENTOLIN HFA Inhale 2 puffs into the lungs every 6 (six) hours as needed for wheezing or shortness of breath.   aspirin EC 81 MG tablet Take 1 tablet (81 mg total) by mouth daily. Take after 12 weeks for prevention of preeclampsia later in pregnancy   budesonide-formoterol 160-4.5 MCG/ACT inhaler Commonly known as: Symbicort Inhale 2 puffs into the lungs in the morning and at bedtime.   Comfort Fit Maternity Supp Lg Misc 1 Units by Does not apply route daily.   cyclobenzaprine 10 MG tablet Commonly known as: FLEXERIL Take 1 tablet (10 mg total) by mouth 2 (two) times daily as needed for muscle spasms.   fluticasone 50 MCG/ACT nasal spray Commonly known as: FLONASE Place 2 sprays into both nostrils daily.   Prenatal Vitamin 27-0.8 MG Tabs Take 1 tablet by mouth daily.   zolpidem 6.25 MG CR tablet Commonly known as: AMBIEN CR Take 1 tablet (6.25 mg total) by mouth at bedtime as needed for sleep.       Jorje Guild, NP 08/13/2019 4:30 PM

## 2019-08-13 NOTE — MAU Note (Signed)
Kathy Flores is a 29 y.o. at [redacted]w[redacted]d here in MAU reporting: states yesterday started having bad migraines so she checked her BP. Last night BP was 156/80 and today they were 138/90 and 141/78. Denies visual changes or RUQ pain.  Onset of complaint: yesterday  Pain score: 5/10  Vitals:   08/13/19 1451  BP: 123/67  Pulse: (!) 108  Resp: 17  Temp: 98.8 F (37.1 C)  SpO2: 99%     FHT: +FM  Lab orders placed from triage: UA

## 2019-08-13 NOTE — Discharge Instructions (Signed)
Hypertension During Pregnancy High blood pressure (hypertension) is when the force of blood pumping through the arteries is too strong. Arteries are blood vessels that carry blood from the heart throughout the body. Hypertension during pregnancy can be mild or severe. Severe hypertension during pregnancy (preeclampsia) is a medical emergency that requires prompt evaluation and treatment. Different types of hypertension can happen during pregnancy. These include:  Chronic hypertension. This happens when you had high blood pressure before you became pregnant, and it continues during the pregnancy. Hypertension that develops before you are [redacted] weeks pregnant and continues during the pregnancy is also called chronic hypertension. If you have chronic hypertension, it will not go away after you have your baby. You will need follow-up visits with your health care provider after you have your baby. Your doctor may want you to keep taking medicine for your blood pressure.  Gestational hypertension. This is hypertension that develops after the 20th week of pregnancy. Gestational hypertension usually goes away after you have your baby, but your health care provider will need to monitor your blood pressure to make sure that it is getting better.  Preeclampsia. This is severe hypertension during pregnancy. This can cause serious complications for you and your baby and can also cause complications for you after the delivery of your baby.  Postpartum preeclampsia. You may develop severe hypertension after giving birth. This usually occurs within 48 hours after childbirth but may occur up to 6 weeks after giving birth. This is rare. How does this affect me? Women who have hypertension during pregnancy have a greater chance of developing hypertension later in life or during future pregnancies. In some cases, hypertension during pregnancy can cause serious complications, such as:  Stroke.  Heart attack.  Injury to  other organs, such as kidneys, lungs, or liver.  Preeclampsia.  Convulsions or seizures.  Placental abruption. How does this affect my baby? Hypertension during pregnancy can affect your baby. Your baby may:  Be born early (prematurely).  Not weigh as much as he or she should at birth (low birth weight).  Not tolerate labor well, leading to an unplanned cesarean delivery. What are the risks? There are certain factors that make it more likely for you to develop hypertension during pregnancy. These include:  Having hypertension during a previous pregnancy.  Being overweight.  Being age 35 or older.  Being pregnant for the first time.  Being pregnant with more than one baby.  Becoming pregnant using fertilization methods, such as IVF (in vitro fertilization).  Having other medical problems, such as diabetes, kidney disease, or lupus.  Having a family history of hypertension. What can I do to lower my risk? The exact cause of hypertension during pregnancy is not known. You may be able to lower your risk by:  Maintaining a healthy weight.  Eating a healthy and balanced diet.  Following your health care provider's instructions about treating any long-term conditions that you had before becoming pregnant. It is very important to keep all of your prenatal care appointments. Your health care provider will check your blood pressure and make sure that your pregnancy is progressing as expected. If a problem is found, early treatment can prevent complications. How is this treated? Treatment for hypertension during pregnancy varies depending on the type of hypertension you have and how serious it is.  If you were taking medicine for high blood pressure before you became pregnant, talk with your health care provider. You may need to change medicine during pregnancy because   some medicines, like ACE inhibitors, may not be considered safe for your baby.  If you have gestational  hypertension, your health care provider may order medicine to treat this during pregnancy.  If you are at risk for preeclampsia, your health care provider may recommend that you take a low-dose aspirin during your pregnancy.  If you have severe hypertension, you may need to be hospitalized so you and your baby can be monitored closely. You may also need to be given medicine to lower your blood pressure. This medicine may be given by mouth or through an IV.  In some cases, if your condition gets worse, you may need to deliver your baby early. Follow these instructions at home: Eating and drinking   Drink enough fluid to keep your urine pale yellow.  Avoid caffeine. Lifestyle  Do not use any products that contain nicotine or tobacco, such as cigarettes, e-cigarettes, and chewing tobacco. If you need help quitting, ask your health care provider.  Do not use alcohol or drugs.  Avoid stress as much as possible.  Rest and get plenty of sleep.  Regular exercise can help to reduce your blood pressure. Ask your health care provider what kinds of exercise are best for you. General instructions  Take over-the-counter and prescription medicines only as told by your health care provider.  Keep all prenatal and follow-up visits as told by your health care provider. This is important. Contact a health care provider if:  You have symptoms that your health care provider told you may require more treatment or monitoring, such as: ? Headaches. ? Nausea or vomiting. ? Abdominal pain. ? Dizziness. ? Light-headedness. Get help right away if:  You have: ? Severe abdominal pain that does not get better with treatment. ? A severe headache that does not get better. ? Vomiting that does not get better. ? Sudden, rapid weight gain. ? Sudden swelling in your hands, ankles, or face. ? Vaginal bleeding. ? Blood in your urine. ? Blurred or double vision. ? Shortness of breath or chest  pain. ? Weakness on one side of your body. ? Difficulty speaking.  Your baby is not moving as much as usual. Summary  High blood pressure (hypertension) is when the force of blood pumping through the arteries is too strong.  Hypertension during pregnancy can cause problems for you and your baby.  Treatment for hypertension during pregnancy varies depending on the type of hypertension you have and how serious it is.  Keep all prenatal and follow-up visits as told by your health care provider. This is important. This information is not intended to replace advice given to you by your health care provider. Make sure you discuss any questions you have with your health care provider. Document Revised: 04/15/2018 Document Reviewed: 01/19/2018 Elsevier Patient Education  [place] Headache Without Cause A headache is pain or discomfort felt around the head or neck area. The specific cause of a headache may not be found. There are many causes and types of headaches. A few common ones are:  Tension headaches.  Migraine headaches.  Cluster headaches.  Chronic daily headaches. Follow these instructions at home: Watch your condition for any changes. Let your health care provider know about them. Take these steps to help with your condition: Managing pain      Take over-the-counter and prescription medicines only as told by your health care provider.  Lie down in a dark, quiet room when you have a headache.  If directed,  put ice on your head and neck area: ? Put ice in a plastic bag. ? Place a towel between your skin and the bag. ? Leave the ice on for 20 minutes, 2-3 times per day.  If directed, apply heat to the affected area. Use the heat source that your health care provider recommends, such as a moist heat pack or a heating pad. ? Place a towel between your skin and the heat source. ? Leave the heat on for 20-30 minutes. ? Remove the heat if your skin turns  bright red. This is especially important if you are unable to feel pain, heat, or cold. You may have a greater risk of getting burned.  Keep lights dim if bright lights bother you or make your headaches worse. Eating and drinking  Eat meals on a regular schedule.  If you drink alcohol: ? Limit how much you use to:  0-1 drink a day for women.  0-2 drinks a day for men. ? Be aware of how much alcohol is in your drink. In the [place] one drink equals one 12 oz bottle of beer (355 mL), one 5 oz glass of wine (148 mL), or one 1 oz glass of hard liquor (44 mL).  Stop drinking caffeine, or decrease the amount of caffeine you drink. General instructions   Keep a headache journal to help find out what may trigger your headaches. For example, write down: ? What you eat and drink. ? How much sleep you get. ? Any change to your diet or medicines.  Try massage or other relaxation techniques.  Limit stress.  Sit up straight, and do not tense your muscles.  Do not use any products that contain nicotine or tobacco, such as cigarettes, e-cigarettes, and chewing tobacco. If you need help quitting, ask your health care provider.  Exercise regularly as told by your health care provider.  Sleep on a regular schedule. Get 7-9 hours of sleep each night, or the amount recommended by your health care provider.  Keep all follow-up visits as told by your health care provider. This is important. Contact a health care provider if:  Your symptoms are not helped by medicine.  You have a headache that is different from the usual headache.  You have nausea or you vomit.  You have a fever. Get help right away if:  Your headache becomes severe quickly.  Your headache gets worse after moderate to intense physical activity.  You have repeated vomiting.  You have a stiff neck.  You have a loss of vision.  You have problems with speech.  You have pain in the eye or ear.  You have muscular  weakness or loss of muscle control.  You lose your balance or have trouble walking.  You feel faint or pass out.  You have confusion.  You have a seizure. Summary  A headache is pain or discomfort felt around the head or neck area.  There are many causes and types of headaches. In some cases, the cause may not be found.  Keep a headache journal to help find out what may trigger your headaches. Watch your condition for any changes. Let your health care provider know about them.  Contact a health care provider if you have a headache that is different from the usual headache, or if your symptoms are not helped by medicine.  Get help right away if your headache becomes severe, you vomit, you have a loss of vision, you lose your balance, or  you have a seizure. This information is not intended to replace advice given to you by your health care provider. Make sure you discuss any questions you have with your health care provider. Document Revised: 07/13/2017 Document Reviewed: 07/13/2017 Elsevier Patient Education  [place].

## 2019-08-25 ENCOUNTER — Other Ambulatory Visit: Payer: Self-pay

## 2019-08-25 ENCOUNTER — Encounter: Payer: Self-pay | Admitting: Medical

## 2019-08-25 ENCOUNTER — Ambulatory Visit (INDEPENDENT_AMBULATORY_CARE_PROVIDER_SITE_OTHER): Payer: Medicaid Other | Admitting: Medical

## 2019-08-25 VITALS — BP 111/79 | HR 111 | Wt 211.0 lb

## 2019-08-25 DIAGNOSIS — F5101 Primary insomnia: Secondary | ICD-10-CM

## 2019-08-25 DIAGNOSIS — O283 Abnormal ultrasonic finding on antenatal screening of mother: Secondary | ICD-10-CM

## 2019-08-25 DIAGNOSIS — D259 Leiomyoma of uterus, unspecified: Secondary | ICD-10-CM

## 2019-08-25 DIAGNOSIS — O99210 Obesity complicating pregnancy, unspecified trimester: Secondary | ICD-10-CM

## 2019-08-25 DIAGNOSIS — Z3A34 34 weeks gestation of pregnancy: Secondary | ICD-10-CM

## 2019-08-25 DIAGNOSIS — O3410 Maternal care for benign tumor of corpus uteri, unspecified trimester: Secondary | ICD-10-CM

## 2019-08-25 DIAGNOSIS — O0990 Supervision of high risk pregnancy, unspecified, unspecified trimester: Secondary | ICD-10-CM

## 2019-08-25 DIAGNOSIS — O34219 Maternal care for unspecified type scar from previous cesarean delivery: Secondary | ICD-10-CM

## 2019-08-25 MED ORDER — ZOLPIDEM TARTRATE ER 6.25 MG PO TBCR: 6.2500 mg | EXTENDED_RELEASE_TABLET | Freq: Every evening | ORAL | 0 refills | Status: DC | PRN

## 2019-08-25 NOTE — Patient Instructions (Addendum)
AREA PEDIATRIC/FAMILY PRACTICE PHYSICIANS  [place] ([address]) . [facility] o Chambliss, MD; Eniola, MD; Hale, MD; Hensel, MD; McDiarmid, MD; McIntyer, MD; Neal, MD; Walden, MD o [address] [place], [place] [address] o [phone] o Mon-Fri 8:30-12:30, 1:30-5:00 o Providers come to see babies at [facility] o Accepting Medicaid . [facility] o Limited providers who accept newborns: Koirala, MD; Morrow, MD; Wolters, MD o [address], [place], [place] [address] o [phone] o Mon-Fri 8:00-5:30 o Babies seen by providers at [facility] o Does NOT accept Medicaid o Please call early in hospitalization for appointment (limited availability)  . [facility] o Mulberry, MD o [address] [place], [place] [address] o [phone] o Mon, Tue, Thur, Fri 8:30-5:00, Wed 10:00-7:00 (closed 1-2pm) o Babies seen by [facility] providers o Accepting Medicaid . [facility] - Pediatrician o Rubin, MD o [address], [place], [place] [address] o [phone] o Mon-Fri 8:30-5:00, Sat 8:30-12:00 o Provider comes to see babies at [facility] o Accepting Medicaid o Must have been referred from current patients or contacted office prior to delivery . [facility] ([facility]) o Brown, MD; Chandler, MD; Ettefagh, MD; Grant, MD; Lester, MD; McCormick, MD; McQueen, MD; Prose, MD; Simha, MD; Stanley, MD; Stryffeler, NP; Tebben, NP o [address], [place], [place] [address] o [phone] o Mon, Tue, Thur, Fri 8:30-5:30, Wed 9:30-5:30, Sat 8:30-12:30 o Babies seen by [facility] providers o Accepting Medicaid o Only accepting infants of first-time parents or siblings of current patients o Hospital discharge coordinator will make follow-up appointment . Jack Amos o [address],  [place], [place]  [address] o [phone]   Fax - [phone] . [facility] o [address], [place], [place]  [address] o Phone - [phone]   Fax - [phone] . Shilpa Gosrani o [address], [place], [place]  [address] o [phone]  [place] ([address]) . [facility] o Bates, MD; [place], MD; Cooper, Cox, MD; MD; Davis, MD; Dovico, MD; Ettefaugh, MD; Little, MD; Lowe, MD; Keiffer, MD; Melvin, MD; Sumner, MD; Williams, MD o [address], [place], [place] [address] o [phone] o Mon-Fri 8:30-5:00 (extended evenings Mon-Thur as needed), Sat-Sun 10:00-1:00 o Providers come to see babies at [facility] o Accepting Medicaid for families of first-time babies and families with all children in the household age 3 and under. Must register with office prior to making appointment (M-F only). . [facility] o Henson, NP; Knapp, MD; Lalonde, MD; Tysinger, PA o [address] [place], [place] [address] o [phone] o Mon-Fri 8:00-5:00 o Babies seen by providers at [facility] o Does NOT accept Medicaid/Commercial Insurance Only . [facility] ([facility])  o Artis, MD; Barnes, MD; Bratton, MD; Coccaro, MD; [place] Gardner, MD; Kramer, MD; Marshall, MD; Netherton, MD; Poleto, MD; Skinner, MD o [address] [place], [place] [address] o [phone] o Mon-Fri 8:30-5:30, Sat (Oct.-Mar.) 9:00-1:00 o Babies seen by providers at [facility] o Accepting Medicaid  [place] ([address]) . [facility] o Reid, MD; Warner, MD o [address], [place], [place] [address] o [phone] o Mon-Fri 8:30-5:00, Sat 8:30-12:00 o Providers come to see babies at [facility] o Does NOT accept Medicaid . [facility] o Becker, PA; Hagler, MD; Scifres, PA; Sun, MD; Swayne, MD o [address],  [place], [place] [address] o [phone] o Mon-Fri 8:00-5:00 o Babies seen by providers at [facility] o Does NOT accept Medicaid o Only accepting babies of parents who are patients o Please call early in hospitalization for appointment (limited availability) . [facility] o Clark, MD; Frye, MD; Kelleher, MD; Mack, NP; Miller, MD; O'Keller, MD; Patterson, NP; Pudlo, MD; Puzio, MD; Thomas, MD; Tucker, MD; Twiselton, MD o [address], [place], [place] [address] o [phone] o Mon-Fri 8:00-5:00, Sat 9:00-12:00 o Providers come to see babies at [facility] o Does NOT accept Medicaid  [place] ([address]) . [facility] o Limited providers accepting new patients: Brake, NP; Wharton, PA o [address], [place], [place] [address] o [phone] o Mon-Fri 8:00-5:00 o Babies seen by providers at [facility] o Does NOT accept Medicaid o Only accepting babies of parents who are patients o Please call early in hospitalization for appointment (limited availability) . [facility] o Gay, MD; Quinlan, MD o [address] [place], [place] [address] o [phone] (press 1 to schedule appointment) o Mon-Fri 8:00-5:00 o Providers come to see babies at [facility] o Does NOT accept Medicaid . [facility] o Mazer, MD o [address] [place], [place] [address] o [phone] o Mon-Fri 8:30-5:00 (lunch 12:30-1:00), extended hours by appointment only Wed 5:00-6:30 o Babies seen by [facility] providers o Accepting Medicaid . [facility] o Banks, MD; Jordan, MD; Koberlein, MD o [address], [place], [place] [address] o [phone] o Mon-Fri 8:00-5:00 o Babies seen by [facility] providers o Does NOT accept Medicaid . [facility] o Parker, MD; Hunter, MD; Wallace, DO o [address] [place], [place]  [address] o [phone] o Mon-Fri 8:00-5:00 o Babies seen by [facility] providers o Does NOT accept Medicaid . [facility] o Brandon, PA; Brecken, PA; Christy, NP; Dees, MD; DeClaire, MD; DeWeese, MD; Hansen, NP; Mills, NP; Parrish, NP; Smoot, NP; Summer, MD; Vapne, MD o [address] [place], [place] [address] o [phone] o Mon-Fri 8:30-5:00, Sat 10:00-1:00 o Providers come to see babies at [facility] o Does NOT accept Medicaid o Free prenatal information session Tuesdays at 4:45pm . [facility] o Bouska, MD; Gordon, PA; Jeffery, PA; Weber, PA o [address] [place] [address] o [phone] o Mon-Fri 7:30-5:30 o Babies seen by [facility] providers . [facility] o [address], [place], [place]  [address] o [phone]   Fax - [phone]  [place] ([address] & [address]) . [facility] o [place], [place] o [address] [place], [place] [address] o [phone] o Mon-Thur 8:00-6:00 o Providers come to see babies at [facility] o Accepting Medicaid . [facility] o Anderson, NP; Badger, MD; Beal, PA; Spencer, PA o [address] [place], [place] [address] o [phone] o Mon-Thur 7:30-7:30, Fri 7:30-4:30 o Babies seen by [facility] providers o Accepting Medicaid . [facility] o Agbuya, MD; Klett, NP; Romgoolam, MD o [address], [place], [place] [address] o [phone] o Mon-Fri 8:30-5:00, Sat 8:30-12:00 o Providers come to see babies at [facility] o Accepting Medicaid o Must have "Meet & Greet" appointment at office prior to delivery . [facility] ([facility] o McCord,   MD; Juleen China, MD; Clydene Laming, [address], [place], [place] [address] o [phone] o Mon-Wed 8:00-6:00, Thur-Fri 8:00-5:00, Sat 9:00-12:00 o Providers come to  see babies at [facility] o Does NOT accept Medicaid o Only accepting siblings of current patients . [facility], [place], [place], [place]  [address] o [phone]   Fax [phone] . [facility] [place] [address], [place], [place]  [address] o [phone]   Fax - [place] Gorman [phone] & [phone]) . Therapist, music at [place], DO; [place], [address] [place], [place] [address] o [phone] o Mon-Fri 7:00-5:00 o Babies seen by [facility] providers o Does NOT accept Medicaid . [place], MD; [place], [place]; [place], [place], [place], [place] [address] o [phone] o Mon-Fri 8:00-5:00 o Babies seen by [facility] providers o Accepting Medicaid . Lamont, MD; [place], [place]; [place], NP; [place], [place], [place], [place] [address] o [phone] o Mon-Fri 8:00-5:00 o Babies seen by providers at [facility] [place] [phone]) . [facility], [place] o [address] [place], [place] [address] o [phone] o Mon-Fri 8:00-5:00 o Babies seen by [facility] providers o Does NOT accept Medicaid o Limited availability, please call early in hospitalization to schedule follow-up . [facility] Leilani Merl, PA; Maisie Fus, MD; [place], MD; [place], [place]; Jeannine Kitten, MD; [place], [place] [facility] [address], [place], [place] [address] o [phone] o Mon-Fri 8:30-5:00, Sat 9:00-12:00 o Babies seen by providers at [facility] o Accepting Medicaid o Please register online then schedule online or call office o [email or web address] . [place] ([place]  [place]) Kristian Covey, NP; Dwyane Dee, MD; Leonidas Romberg, PA o [address] [place], [place], [place] [address] o [phone] o Mon-Fri 8:00-5:00 o Babies seen by providers at [place] o Accepting Medicaid . [place] ([facility]) Dairl Ponder, MD; Rayvon Char, NP; Melina Modena, MD o [address] [place], [place], [place] [address] o [phone] o Mon-Fri 8:00-5:30, Sat&Sun by appointment (phones open at 8:30) o Babies seen by [facility] providers o Accepting Medicaid o Must be a first-time baby or sibling of current patient . [place], [address], [place], [place]  [address] o [phone]   Fax - [phone]  [place] [phone] & [phone]) . [place], [place]; [place], [place]; Benjamine Mola, MD; [place], [place]; Harrell Lark, MD o [address] [place], [place] [address] o [phone] o Mon-Thur 8:00-7:00, Fri 8:00-5:00, Sat 8:00-12:00, Sun 9:00-12:00 o Babies seen by [facility] providers o Accepting Medicaid . [facility], MD; Ruthann Cancer, MD; [facility], MD o [address], [place], [place] [address] o [phone] o Mon-Thur 8:00-5:00 o Babies seen by providers at [facility] o Accepting Medicaid . [facility] [place], MD; Coe-Goins, MD; Amedeo Plenty, MD; Bobby Rumpf, MD; List, MD; Lavonia Drafts, MD; Ruthann Cancer, MD; Selinda Eon, MD; Audie Box, MD; Jim Like, MD; Christie Nottingham, MD; Hubbard Hartshorn, MD; [place] Nunnery, MD o [address] [place], [place]  [address] o [phone] o Mon-Fri 8:00-5:30, Sat (Oct.-Mar.) 9:00-1:00 o Babies seen by providers at [facility] o Accepting Medicaid o Must fill out new patient packet, available online at [email or web address] . [place] ([facility]) Barnabas Lister, NP; [place] Kingfisher, NP; Claiborne Billings, NP; Rolla Plate, MD;  [place], [place]; [place]ola Rhine, MD; Tyron Russell, MD; Delia Chimes, NP o [address] [place] [address], [place], [place] [address] o [phone] o Mon-Thur 8:00-5:30, Fri 8:00-5:00 o Babies seen by providers at [place] [phone]) . [place], [place]; [place], MD; Dennard Schaumann, MD; [place], [place] o [address] [place] [address] [place], [place] [address] o [phone] o Mon-Fri 8:00-5:00 o Babies seen by providers at [place] [phone]) . [facility] [place], [place]; Olen Pel, MD; [place], [place], [place], [place] [address] o [phone] o Mon-Fri 8:00-5:00 o Babies seen by providers at [facility] o Does NOT accept Medicaid o Limited appointment availability, please call early in hospitalization  . Therapist, music at [place], [place]; [place], [address], [place], [place] [address] o [phone] o Mon-Fri 8:00-5:00 o Babies seen by [facility] providers o Does NOT accept Medicaid . [facility] Su [place], MD; Guy Sandifer, MD; [place], [place]; [place], [place] [address], [place], [place] [address] o [phone] o Mon-Fri 8:00-5:00 o After hours clinic [facility] [place], [place] [address]) [phone] Mon-Fri 5:00-8:00, Sat 12:00-6:00, Sun 10:00-4:00 o Babies seen by [facility] providers o Accepting Medicaid . [place] [facility] o [address], [place], [place]  [address] o [phone]   Fax - [phone]  [place] [phone]) . Therapist, music at [facility], MD o [address] [place], [place], [place] [address] o [phone] o Mon-Fri 8:00-5:00 o Babies seen by [facility] ([facility] providers o Does NOT accept Medicaid . [place] ([place] [facility] [place]) Bing Neighbors, MD o [address] [place], [place], [place]  [address] o [phone] o Mon-Thur 8:00-7:00, Fri 8:00-5:00, Sat 8:00-12:00 o Babies seen by providers at [facility] o Accepting Medicaid - but does not have vaccinations in office (must be received elsewhere) o Limited availability, please call early in hospitalization  [place] ([address]) . [place], [place], [place] [address] o [phone]  Fax [phone]  Fetal Movement Counts Patient Name: ________________________________________________ Patient Due Date: ____________________ What is a fetal movement count?  A fetal movement count is the number of times that you feel your baby move during a certain amount of time. This may also be called a fetal kick count. A fetal movement count is recommended for every pregnant woman. You may be asked to start counting fetal movements as early as week 28 of your pregnancy. Pay attention to when your baby is most active. You may notice your baby's sleep and wake cycles. You may also notice things that make your baby move more. You should do a fetal movement count:  When your baby is normally most active.  At the same time each day. A good time to count movements is while you are resting, after having something to eat and drink. How do I count fetal movements? 1. Find a quiet, comfortable area. Sit, or lie down on your side.  2. Write down the date, the start time and stop time, and the number of movements that you felt between those two times. Take this information with you to your health care visits. 3. Write down your start time when you feel the first movement. 4. Count kicks, flutters, swishes, rolls, and jabs. You should feel at least 10 movements. 5. You may stop counting after you have felt 10 movements, or if you have been counting for 2 hours. Write down the stop time. 6. If you do not feel 10 movements in 2 hours, contact your health care provider for further instructions. Your  health care provider may want to do additional tests to assess your baby's well-being. Contact a health care provider if:  You feel fewer than 10 movements in 2 hours.  Your baby is not moving like he or she usually does. Date: ____________ Start time: ____________ Stop time: ____________ Movements: ____________ Date: ____________ Start time: ____________ Stop time: ____________ Movements: ____________ Date: ____________ Start time: ____________ Stop time: ____________ Movements: ____________ Date: ____________ Start time: ____________ Stop time: ____________ Movements: ____________ Date: ____________ Start time: ____________ Stop time: ____________ Movements: ____________ Date: ____________ Start time: ____________ Stop time: ____________ Movements: ____________ Date: ____________ Start time: ____________ Stop time: ____________ Movements: ____________ Date: ____________ Start time: ____________ Stop time: ____________ Movements: ____________ Date: ____________ Start time: ____________ Stop time: ____________ Movements: ____________ This information is not intended to replace advice given to you by your health care provider. Make sure you discuss any questions you have with your health care provider. Document Revised: 08/12/2018 Document Reviewed: 08/12/2018 Elsevier Patient Education  2020 Elsevier Inc.  [employer] of the uterus can occur throughout pregnancy, but they are not always a sign that you are in labor. You may have practice contractions called Braxton Hicks contractions. These false labor contractions are sometimes confused with true labor. What are [place] contractions? Braxton Hicks contractions are tightening movements that occur in the muscles of the uterus before labor. Unlike true labor contractions, these contractions do not result in opening (dilation) and thinning of the cervix. Toward the end of pregnancy (32-34 weeks), Braxton Hicks  contractions can happen more often and may become stronger. These contractions are sometimes difficult to tell apart from true labor because they can be very uncomfortable. You should not feel embarrassed if you go to the hospital with false labor. Sometimes, the only way to tell if you are in true labor is for your health care provider to look for changes in the cervix. The health care provider will do a physical exam and may monitor your contractions. If you are not in true labor, the exam should show that your cervix is not dilating and your water has not broken. If there are no other health problems associated with your pregnancy, it is completely safe for you to be sent home with false labor. You may continue to have Braxton Hicks contractions until you go into true labor. How to tell the difference between true labor and false labor True labor  Contractions last 30-70 seconds.  Contractions become very regular.  Discomfort is usually felt in the top of the uterus, and it spreads to the lower abdomen and low back.  Contractions do not go away with walking.  Contractions usually become more intense and increase in frequency.  The cervix dilates and gets thinner. False labor  Contractions are usually shorter and not as strong as true labor contractions.  Contractions are usually irregular.  Contractions are often felt in the front of the lower abdomen and in the groin.  Contractions may go away when you walk around or change positions while lying down.  Contractions get weaker and are shorter-lasting as time goes on.  The cervix usually does not dilate or become thin. Follow these instructions at home:   Take over-the-counter and prescription medicines only as told by your health care provider.  Keep up with your usual exercises and follow other instructions from your health care provider.  Eat and drink lightly if you think you are going into labor.  If Braxton Hicks  contractions are making you uncomfortable: ? Change your position from lying down or resting to walking, or change from walking to resting. ? Sit and rest in a tub of warm water. ? Drink enough fluid to keep your urine pale yellow. Dehydration may cause these contractions. ? Do slow and deep breathing several times an hour.  Keep all follow-up prenatal visits as told by your health care provider. This is important. Contact a health care provider if:  You have a fever.  You have continuous pain in your abdomen. Get help right away if:  Your contractions become stronger, more regular, and closer together.  You have fluid leaking or gushing from your vagina.  You pass blood-tinged mucus (bloody show).  You have bleeding from your vagina.  You have low back pain that you never had before.  You feel your baby's head pushing down and causing pelvic pressure.  Your baby is not moving inside you as much as it used to. Summary  Contractions that occur before labor are called Braxton Hicks contractions, false labor, or practice contractions.  Braxton Hicks contractions are usually shorter, weaker, farther apart, and less regular than true labor contractions. True labor contractions usually become progressively stronger and regular, and they become more frequent.  Manage discomfort from [facility] contractions by changing position, resting in a warm bath, drinking plenty of water, or practicing deep breathing. This information is not intended to replace advice given to you by your health care provider. Make sure you discuss any questions you have with your health care provider. Document Revised: 12/05/2016 Document Reviewed: 05/08/2016 Elsevier Patient Education  [place].   For pregnancy belt:  Child psychotherapist and [place], [place], [place] [address] Phone: [phone]  Monday      8:30AM-5PM Tuesday 8:30AM-5PM Wednesday 8:30AM-5PM Thursday 8:30AM-5PM Friday  8:30AM-5PM Saturday Closed Sunday Closed

## 2019-08-25 NOTE — Progress Notes (Signed)
° °  PRENATAL VISIT NOTE  Subjective:  Kathy Flores is a 29 y.o. [phone] at [redacted]w[redacted]d being seen today for ongoing prenatal care.  She is currently monitored for the following issues for this high-risk pregnancy and has Normochromic normocytic anemia; Moderate persistent asthma with exacerbation; Asthma affecting pregnancy in third trimester; Supervision of high risk pregnancy, antepartum; Bipolar depression ([place]); History of cesarean section complicating pregnancy; Late prenatal care affecting pregnancy in second trimester; Obesity during pregnancy, antepartum; Primary insomnia; Abnormal fetal ultrasound; and Uterine fibroid in antepartum period on their problem list.  Patient reports backache and occasional contractions.  Contractions: Irritability. Vag. Bleeding: None.  Movement: Present. Denies leaking of fluid.   The following portions of the patient's history were reviewed and updated as appropriate: allergies, current medications, past family history, past medical history, past social history, past surgical history and problem list.   Objective:   Vitals:   08/25/19 1027  BP: 111/79  Pulse: (!) 111  Weight: 211 lb (95.7 kg)    Fetal Status: Fetal Heart Rate (bpm): 150 Fundal Height: 33 cm Movement: Present     General:  Alert, oriented and cooperative. Patient is in no acute distress.  Skin: Skin is warm and dry. No rash noted.   Cardiovascular: Normal heart rate noted  Respiratory: Normal respiratory effort, no problems with respiration noted  Abdomen: Soft, gravid, appropriate for gestational age.  Pain/Pressure: Present     Pelvic: Cervical exam deferred        Extremities: Normal range of motion.  Edema: None  Mental Status: Normal mood and affect. Normal behavior. Normal judgment and thought content.   Assessment and Plan:  Pregnancy: G4P3003 at [redacted]w[redacted]d 1. Supervision of high risk pregnancy, antepartum - Anticipatory guidance for upcoming visits discussed - Discussed use of  pregnancy belt, hydrotherapy, heat therapy and tylenol for pain  - Peds list given and encouraged to decide by 36 weeks  - Discussed option for IOL anytime after 40 weeks as patient does not want to wait until 41 weeks due to pain  2. Abnormal fetal ultrasound - 8/5 US showed pericardial effusion and fetal gallstones - Follow-up scheduled 9/3  3. Obesity during pregnancy, antepartum  4. History of cesarean section complicating pregnancy - TOLAC signed 7/13  5. Uterine fibroid in antepartum period  6. Primary insomnia - zolpidem (AMBIEN CR) 6.25 MG CR tablet; Take 1 tablet (6.25 mg total) by mouth at bedtime as needed for sleep.  Dispense: 30 tablet; Refill: 0  Preterm labor symptoms and general obstetric precautions including but not limited to vaginal bleeding, contractions, leaking of fluid and fetal movement were reviewed in detail with the patient. Please refer to After Visit Summary for other counseling recommendations.   Return in about 2 weeks (around 09/08/2019) for [facility] APP, In-Person.  Future Appointments  Date Time Provider [place]  09/09/2019  8:45 AM [facility] US4 [facility]    Kerry Hough, PA-C

## 2019-08-26 ENCOUNTER — Telehealth: Payer: Self-pay | Admitting: *Deleted

## 2019-08-26 MED ORDER — ZOLPIDEM TARTRATE 5 MG PO TABS: 5.0000 mg | ORAL_TABLET | Freq: Every evening | ORAL | 0 refills | Status: DC | PRN

## 2019-08-26 NOTE — Telephone Encounter (Addendum)
VM message received from [place] stating that the prescription for Zolpidem requires prior auth. I called and discussed with the pharmacy. I was informed that the original Rx on 7/28 for this mediation was not filled due to needing prior auth as well - this was never received. Rx changed per Kerry Hough, PA and was e-prescribed.

## 2019-08-31 ENCOUNTER — Telehealth: Payer: Self-pay | Admitting: Family Medicine

## 2019-08-31 ENCOUNTER — Telehealth: Payer: Self-pay

## 2019-08-31 NOTE — Telephone Encounter (Signed)
Called pharmacy to see if they can give the patient Ambien as a generic so that she would not need a Prior Auth. Spoke to the pharmacist and he changed and corrected the prescription to push through to see if her insurance would accept it.  Called patient to advise and patient verbalized understanding.

## 2019-08-31 NOTE — Telephone Encounter (Signed)
Patient requesting a RX for some sleeping medication, state she was told last week it would be called in and she never got it.

## 2019-09-06 ENCOUNTER — Encounter: Payer: Self-pay | Admitting: Certified Nurse Midwife

## 2019-09-06 DIAGNOSIS — Z148 Genetic carrier of other disease: Secondary | ICD-10-CM | POA: Insufficient documentation

## 2019-09-06 DIAGNOSIS — D563 Thalassemia minor: Secondary | ICD-10-CM | POA: Insufficient documentation

## 2019-09-09 ENCOUNTER — Ambulatory Visit (INDEPENDENT_AMBULATORY_CARE_PROVIDER_SITE_OTHER): Payer: Medicaid Other | Admitting: Obstetrics and Gynecology

## 2019-09-09 ENCOUNTER — Encounter: Payer: Self-pay | Admitting: Obstetrics and Gynecology

## 2019-09-09 ENCOUNTER — Ambulatory Visit: Payer: Medicaid Other | Attending: Obstetrics and Gynecology

## 2019-09-09 ENCOUNTER — Other Ambulatory Visit: Payer: Self-pay

## 2019-09-09 ENCOUNTER — Other Ambulatory Visit (HOSPITAL_COMMUNITY)
Admission: RE | Admit: 2019-09-09 | Discharge: 2019-09-09 | Disposition: A | Discharge status: Home / Self Care | Payer: Medicaid Other | Source: Ambulatory Visit | Attending: Obstetrics and Gynecology | Admitting: Obstetrics and Gynecology

## 2019-09-09 VITALS — BP 120/83 | HR 128 | Wt 214.9 lb

## 2019-09-09 DIAGNOSIS — E669 Obesity, unspecified: Secondary | ICD-10-CM | POA: Diagnosis not present

## 2019-09-09 DIAGNOSIS — Z362 Encounter for other antenatal screening follow-up: Secondary | ICD-10-CM

## 2019-09-09 DIAGNOSIS — O34219 Maternal care for unspecified type scar from previous cesarean delivery: Secondary | ICD-10-CM

## 2019-09-09 DIAGNOSIS — O3410 Maternal care for benign tumor of corpus uteri, unspecified trimester: Secondary | ICD-10-CM

## 2019-09-09 DIAGNOSIS — Z148 Genetic carrier of other disease: Secondary | ICD-10-CM

## 2019-09-09 DIAGNOSIS — O99213 Obesity complicating pregnancy, third trimester: Secondary | ICD-10-CM | POA: Diagnosis not present

## 2019-09-09 DIAGNOSIS — Z8759 Personal history of other complications of pregnancy, childbirth and the puerperium: Secondary | ICD-10-CM | POA: Insufficient documentation

## 2019-09-09 DIAGNOSIS — O0990 Supervision of high risk pregnancy, unspecified, unspecified trimester: Secondary | ICD-10-CM | POA: Insufficient documentation

## 2019-09-09 DIAGNOSIS — O99210 Obesity complicating pregnancy, unspecified trimester: Secondary | ICD-10-CM

## 2019-09-09 DIAGNOSIS — O365930 Maternal care for other known or suspected poor fetal growth, third trimester, not applicable or unspecified: Secondary | ICD-10-CM | POA: Diagnosis not present

## 2019-09-09 DIAGNOSIS — Z3A36 36 weeks gestation of pregnancy: Secondary | ICD-10-CM

## 2019-09-09 DIAGNOSIS — O283 Abnormal ultrasonic finding on antenatal screening of mother: Secondary | ICD-10-CM

## 2019-09-09 DIAGNOSIS — D259 Leiomyoma of uterus, unspecified: Secondary | ICD-10-CM

## 2019-09-09 NOTE — Progress Notes (Signed)
° °  PRENATAL VISIT NOTE  Subjective:  Kathy Flores is a 29 y.o. [phone] at [redacted]w[redacted]d being seen today for ongoing prenatal care.  She is currently monitored for the following issues for this high-risk pregnancy and has Normochromic normocytic anemia; Moderate persistent asthma with exacerbation; Asthma affecting pregnancy in third trimester; Supervision of high risk pregnancy, antepartum; Bipolar depression ([place]); History of cesarean section complicating pregnancy; Late prenatal care affecting pregnancy in second trimester; Obesity during pregnancy, antepartum; Primary insomnia; Abnormal fetal ultrasound; Uterine fibroid in antepartum period; Alpha thalassemia silent carrier; and Carrier of fragile X syndrome on their problem list.  Patient reports regular painful contractions about every hour, over the last several days..   Kathy Flores. Bleeding: None.  Movement: Present. Denies leaking of fluid.   The following portions of the patient's history were reviewed and updated as appropriate: allergies, current medications, past family history, past medical history, past social history, past surgical history and problem list.   Objective:   Vitals:   09/09/19 1027  BP: 120/83  Pulse: (!) 128  Weight: 214 lb 14.4 oz (97.5 kg)    Fetal Status: Fetal Heart Rate (bpm): 145   Movement: Present     General:  Alert, oriented and cooperative. Patient is in no acute distress.  Skin: Skin is warm and dry. No rash noted.   Cardiovascular: Normal heart rate noted  Respiratory: Normal respiratory effort, no problems with respiration noted  Abdomen: Soft, gravid, appropriate for gestational age.  Pain/Pressure: Present     Pelvic: Cervical exam performed in the presence of a chaperone fingertip/thick/-2        Extremities: Normal range of motion.  Edema: Trace  Mental Status: Normal mood and affect. Normal behavior. Normal judgment and thought content.   Assessment and Plan:  Pregnancy: G4P3003 at [redacted]w[redacted]d  1.  Supervision of high risk pregnancy, antepartum - GC/Chlamydia probe amp ([place])not at [facility] - Strep Gp B Culture+Rflx - will get COVID vaccine in hospital after delivery  2. [redacted] weeks gestation of pregnancy  3. Uterine fibroid in antepartum period  4. History of cesarean section complicating pregnancy For TOLAC  5. Obesity during pregnancy, antepartum  6. Abnormal fetal ultrasound Fetal gallstones noted on [place], tell peds post partum  7. Carrier of fragile X syndrome   Preterm labor symptoms and general obstetric precautions including but not limited to vaginal bleeding, contractions, leaking of fluid and fetal movement were reviewed in detail with the patient. Please refer to After Visit Summary for other counseling recommendations.   Return in about 1 week (around 09/16/2019) for high OB, in person.  No future appointments.  Sloan Leiter, MD

## 2019-09-13 LAB — GC/CHLAMYDIA PROBE AMP (~~LOC~~) NOT AT ARMC
Chlamydia: NEGATIVE
Comment: NEGATIVE
Comment: NORMAL
Neisseria Gonorrhea: NEGATIVE

## 2019-09-14 LAB — STREP GP B SUSCEPTIBILITY

## 2019-09-14 LAB — STREP GP B CULTURE+RFLX: Strep Gp B Culture+Rflx: POSITIVE — AB

## 2019-09-16 ENCOUNTER — Ambulatory Visit (INDEPENDENT_AMBULATORY_CARE_PROVIDER_SITE_OTHER): Payer: Medicaid Other | Admitting: Medical

## 2019-09-16 ENCOUNTER — Other Ambulatory Visit: Payer: Self-pay | Admitting: Medical

## 2019-09-16 ENCOUNTER — Other Ambulatory Visit: Payer: Self-pay

## 2019-09-16 VITALS — BP 128/84 | HR 120 | Wt 217.9 lb

## 2019-09-16 DIAGNOSIS — O3410 Maternal care for benign tumor of corpus uteri, unspecified trimester: Secondary | ICD-10-CM

## 2019-09-16 DIAGNOSIS — J45909 Unspecified asthma, uncomplicated: Secondary | ICD-10-CM

## 2019-09-16 DIAGNOSIS — F5101 Primary insomnia: Secondary | ICD-10-CM

## 2019-09-16 DIAGNOSIS — O99513 Diseases of the respiratory system complicating pregnancy, third trimester: Secondary | ICD-10-CM

## 2019-09-16 DIAGNOSIS — O98819 Other maternal infectious and parasitic diseases complicating pregnancy, unspecified trimester: Secondary | ICD-10-CM

## 2019-09-16 DIAGNOSIS — B951 Streptococcus, group B, as the cause of diseases classified elsewhere: Secondary | ICD-10-CM | POA: Insufficient documentation

## 2019-09-16 DIAGNOSIS — Z3A37 37 weeks gestation of pregnancy: Secondary | ICD-10-CM

## 2019-09-16 DIAGNOSIS — O0990 Supervision of high risk pregnancy, unspecified, unspecified trimester: Secondary | ICD-10-CM

## 2019-09-16 DIAGNOSIS — D259 Leiomyoma of uterus, unspecified: Secondary | ICD-10-CM

## 2019-09-16 DIAGNOSIS — O34219 Maternal care for unspecified type scar from previous cesarean delivery: Secondary | ICD-10-CM

## 2019-09-16 DIAGNOSIS — O0932 Supervision of pregnancy with insufficient antenatal care, second trimester: Secondary | ICD-10-CM

## 2019-09-16 DIAGNOSIS — O99210 Obesity complicating pregnancy, unspecified trimester: Secondary | ICD-10-CM

## 2019-09-16 MED ORDER — ZOLPIDEM TARTRATE 5 MG PO TABS: 5.0000 mg | ORAL_TABLET | Freq: Every evening | ORAL | 0 refills | Status: DC | PRN

## 2019-09-16 MED ORDER — ZOLPIDEM TARTRATE 5 MG PO TABS: 5.0000 mg | ORAL_TABLET | Freq: Every evening | ORAL | 0 refills | Status: AC | PRN

## 2019-09-16 NOTE — Patient Instructions (Signed)
Fetal Movement Counts Patient Name: ________________________________________________ Patient Due Date: ____________________ What is a fetal movement count?  A fetal movement count is the number of times that you feel your baby move during a certain amount of time. This may also be called a fetal kick count. A fetal movement count is recommended for every pregnant woman. You may be asked to start counting fetal movements as early as week 28 of your pregnancy. Pay attention to when your baby is most active. You may notice your baby's sleep and wake cycles. You may also notice things that make your baby move more. You should do a fetal movement count:  When your baby is normally most active.  At the same time each day. A good time to count movements is while you are resting, after having something to eat and drink. How do I count fetal movements? 1. Find a quiet, comfortable area. Sit, or lie down on your side. 2. Write down the date, the start time and stop time, and the number of movements that you felt between those two times. Take this information with you to your health care visits. 3. Write down your start time when you feel the first movement. 4. Count kicks, flutters, swishes, rolls, and jabs. You should feel at least 10 movements. 5. You may stop counting after you have felt 10 movements, or if you have been counting for 2 hours. Write down the stop time. 6. If you do not feel 10 movements in 2 hours, contact your health care provider for further instructions. Your health care provider may want to do additional tests to assess your baby's well-being. Contact a health care provider if:  You feel fewer than 10 movements in 2 hours.  Your baby is not moving like he or she usually does. Date: ____________ Start time: ____________ Stop time: ____________ Movements: ____________ Date: ____________ Start time: ____________ Stop time: ____________ Movements: ____________ Date: ____________  Start time: ____________ Stop time: ____________ Movements: ____________ Date: ____________ Start time: ____________ Stop time: ____________ Movements: ____________ Date: ____________ Start time: ____________ Stop time: ____________ Movements: ____________ Date: ____________ Start time: ____________ Stop time: ____________ Movements: ____________ Date: ____________ Start time: ____________ Stop time: ____________ Movements: ____________ Date: ____________ Start time: ____________ Stop time: ____________ Movements: ____________ Date: ____________ Start time: ____________ Stop time: ____________ Movements: ____________ This information is not intended to replace advice given to you by your health care provider. Make sure you discuss any questions you have with your health care provider. Document Revised: 08/12/2018 Document Reviewed: 08/12/2018 Elsevier Patient Education  2020 [employer] of the uterus can occur throughout pregnancy, but they are not always a sign that you are in labor. You may have practice contractions called Braxton Hicks contractions. These false labor contractions are sometimes confused with true labor. What are [place] contractions? Braxton Hicks contractions are tightening movements that occur in the muscles of the uterus before labor. Unlike true labor contractions, these contractions do not result in opening (dilation) and thinning of the cervix. Toward the end of pregnancy (32-34 weeks), Braxton Hicks contractions can happen more often and may become stronger. These contractions are sometimes difficult to tell apart from true labor because they can be very uncomfortable. You should not feel embarrassed if you go to the hospital with false labor. Sometimes, the only way to tell if you are in true labor is for your health care provider to look for changes in the cervix. The health care provider  will do a physical exam and may  monitor your contractions. If you are not in true labor, the exam should show that your cervix is not dilating and your water has not broken. If there are no other health problems associated with your pregnancy, it is completely safe for you to be sent home with false labor. You may continue to have Braxton Hicks contractions until you go into true labor. How to tell the difference between true labor and false labor True labor  Contractions last 30-70 seconds.  Contractions become very regular.  Discomfort is usually felt in the top of the uterus, and it spreads to the lower abdomen and low back.  Contractions do not go away with walking.  Contractions usually become more intense and increase in frequency.  The cervix dilates and gets thinner. False labor  Contractions are usually shorter and not as strong as true labor contractions.  Contractions are usually irregular.  Contractions are often felt in the front of the lower abdomen and in the groin.  Contractions may go away when you walk around or change positions while lying down.  Contractions get weaker and are shorter-lasting as time goes on.  The cervix usually does not dilate or become thin. Follow these instructions at home:   Take over-the-counter and prescription medicines only as told by your health care provider.  Keep up with your usual exercises and follow other instructions from your health care provider.  Eat and drink lightly if you think you are going into labor.  If Braxton Hicks contractions are making you uncomfortable: ? Change your position from lying down or resting to walking, or change from walking to resting. ? Sit and rest in a tub of warm water. ? Drink enough fluid to keep your urine pale yellow. Dehydration may cause these contractions. ? Do slow and deep breathing several times an hour.  Keep all follow-up prenatal visits as told by your health care provider. This is important. Contact a  health care provider if:  You have a fever.  You have continuous pain in your abdomen. Get help right away if:  Your contractions become stronger, more regular, and closer together.  You have fluid leaking or gushing from your vagina.  You pass blood-tinged mucus (bloody show).  You have bleeding from your vagina.  You have low back pain that you never had before.  You feel your baby's head pushing down and causing pelvic pressure.  Your baby is not moving inside you as much as it used to. Summary  Contractions that occur before labor are called Braxton Hicks contractions, false labor, or practice contractions.  Braxton Hicks contractions are usually shorter, weaker, farther apart, and less regular than true labor contractions. True labor contractions usually become progressively stronger and regular, and they become more frequent.  Manage discomfort from Braxton Hicks contractions by changing position, resting in a warm bath, drinking plenty of water, or practicing deep breathing. This information is not intended to replace advice given to you by your health care provider. Make sure you discuss any questions you have with your health care provider. Document Revised: 12/05/2016 Document Reviewed: 05/08/2016 Elsevier Patient Education  2020 Elsevier Inc.  

## 2019-09-16 NOTE — Addendum Note (Signed)
Addended by: Luvenia Redden on: 09/16/2019 11:31 AM   Modules accepted: Orders

## 2019-09-16 NOTE — Progress Notes (Signed)
Needs a refill on sleeping medication.

## 2019-09-16 NOTE — Progress Notes (Signed)
° °  PRENATAL VISIT NOTE  Subjective:  Kathy Flores is a 29 y.o. [phone] at [redacted]w[redacted]d being seen today for ongoing prenatal care.  She is currently monitored for the following issues for this high-risk pregnancy and has Normochromic normocytic anemia; Moderate persistent asthma with exacerbation; Asthma affecting pregnancy in third trimester; Supervision of high risk pregnancy, antepartum; Bipolar depression ([place]); History of cesarean section complicating pregnancy; Late prenatal care affecting pregnancy in second trimester; Obesity during pregnancy, antepartum; Primary insomnia; Abnormal fetal ultrasound; Uterine fibroid in antepartum period; Alpha thalassemia silent carrier; and Carrier of fragile X syndrome on their problem list.  Patient reports occasional contractions.  Contractions: Irritability. Vag. Bleeding: None.  Movement: Present. Denies leaking of fluid.   The following portions of the patient's history were reviewed and updated as appropriate: allergies, current medications, past family history, past medical history, past social history, past surgical history and problem list.   Objective:   Vitals:   09/16/19 0845  BP: 128/84  Pulse: (!) 120  Weight: 217 lb 14.4 oz (98.8 kg)    Fetal Status: Fetal Heart Rate (bpm): 145   Movement: Present  Presentation: Vertex  General:  Alert, oriented and cooperative. Patient is in no acute distress.  Skin: Skin is warm and dry. No rash noted.   Cardiovascular: Normal heart rate noted  Respiratory: Normal respiratory effort, no problems with respiration noted  Abdomen: Soft, gravid, appropriate for gestational age.  Pain/Pressure: Absent     Pelvic: Cervical exam performed in the presence of a chaperone Dilation: Fingertip Effacement (%): 50 Station: -3  Extremities: Normal range of motion.  Edema: Trace  Mental Status: Normal mood and affect. Normal behavior. Normal judgment and thought content.   Assessment and Plan:  Pregnancy:  G4P3003 at [redacted]w[redacted]d 1. Supervision of high risk pregnancy, antepartum - Doing well - +GBS discussed, needs treatment in labor  - Declined flu vaccine - Planning to get COVID vaccine after delivery   2. Primary insomnia - Rx refill for Ambien sent at patient's request   3. Late prenatal care affecting pregnancy in second trimester  4. Obesity during pregnancy, antepartum - 8# TWG  5. History of cesarean section complicating pregnancy - Planning TOLAC, has previously signed consent   6. Asthma affecting pregnancy in third trimester - Needed inhaler and nebulizer once in the last week   7. Uterine fibroid in antepartum period  8. [redacted] weeks gestation of pregnancy  Term labor symptoms and general obstetric precautions including but not limited to vaginal bleeding, contractions, leaking of fluid and fetal movement were reviewed in detail with the patient. Please refer to After Visit Summary for other counseling recommendations.   Return in about 1 week (around 09/23/2019) for LOB, In-Person.  No future appointments.  Kerry Hough, PA-C

## 2019-09-26 ENCOUNTER — Inpatient Hospital Stay (EMERGENCY_DEPARTMENT_HOSPITAL)
Admission: AD | Admit: 2019-09-26 | Discharge: 2019-09-26 | Disposition: A | Discharge status: Home / Self Care | Payer: Medicaid Other | Source: Home / Self Care | Attending: Obstetrics and Gynecology | Admitting: Obstetrics and Gynecology

## 2019-09-26 ENCOUNTER — Other Ambulatory Visit: Payer: Self-pay

## 2019-09-26 ENCOUNTER — Encounter (HOSPITAL_COMMUNITY): Payer: Self-pay | Admitting: Obstetrics and Gynecology

## 2019-09-26 ENCOUNTER — Ambulatory Visit (INDEPENDENT_AMBULATORY_CARE_PROVIDER_SITE_OTHER): Payer: Medicaid Other | Admitting: Obstetrics and Gynecology

## 2019-09-26 ENCOUNTER — Encounter: Payer: Self-pay | Admitting: Obstetrics and Gynecology

## 2019-09-26 VITALS — BP 144/99 | HR 97 | Wt 223.3 lb

## 2019-09-26 DIAGNOSIS — Z87891 Personal history of nicotine dependence: Secondary | ICD-10-CM | POA: Insufficient documentation

## 2019-09-26 DIAGNOSIS — O139 Gestational [pregnancy-induced] hypertension without significant proteinuria, unspecified trimester: Secondary | ICD-10-CM | POA: Insufficient documentation

## 2019-09-26 DIAGNOSIS — O0990 Supervision of high risk pregnancy, unspecified, unspecified trimester: Secondary | ICD-10-CM

## 2019-09-26 DIAGNOSIS — R030 Elevated blood-pressure reading, without diagnosis of hypertension: Secondary | ICD-10-CM

## 2019-09-26 DIAGNOSIS — O26893 Other specified pregnancy related conditions, third trimester: Secondary | ICD-10-CM | POA: Insufficient documentation

## 2019-09-26 DIAGNOSIS — O133 Gestational [pregnancy-induced] hypertension without significant proteinuria, third trimester: Secondary | ICD-10-CM

## 2019-09-26 DIAGNOSIS — J45909 Unspecified asthma, uncomplicated: Secondary | ICD-10-CM | POA: Insufficient documentation

## 2019-09-26 DIAGNOSIS — O99891 Other specified diseases and conditions complicating pregnancy: Secondary | ICD-10-CM

## 2019-09-26 DIAGNOSIS — Z3A38 38 weeks gestation of pregnancy: Secondary | ICD-10-CM

## 2019-09-26 DIAGNOSIS — M7989 Other specified soft tissue disorders: Secondary | ICD-10-CM | POA: Insufficient documentation

## 2019-09-26 DIAGNOSIS — Z8759 Personal history of other complications of pregnancy, childbirth and the puerperium: Secondary | ICD-10-CM | POA: Insufficient documentation

## 2019-09-26 DIAGNOSIS — Z79899 Other long term (current) drug therapy: Secondary | ICD-10-CM | POA: Insufficient documentation

## 2019-09-26 DIAGNOSIS — O283 Abnormal ultrasonic finding on antenatal screening of mother: Secondary | ICD-10-CM

## 2019-09-26 DIAGNOSIS — Z6841 Body Mass Index (BMI) 40.0 and over, adult: Secondary | ICD-10-CM

## 2019-09-26 DIAGNOSIS — Z7982 Long term (current) use of aspirin: Secondary | ICD-10-CM | POA: Insufficient documentation

## 2019-09-26 DIAGNOSIS — O99513 Diseases of the respiratory system complicating pregnancy, third trimester: Secondary | ICD-10-CM

## 2019-09-26 DIAGNOSIS — B951 Streptococcus, group B, as the cause of diseases classified elsewhere: Secondary | ICD-10-CM

## 2019-09-26 DIAGNOSIS — O98819 Other maternal infectious and parasitic diseases complicating pregnancy, unspecified trimester: Secondary | ICD-10-CM

## 2019-09-26 DIAGNOSIS — O09293 Supervision of pregnancy with other poor reproductive or obstetric history, third trimester: Secondary | ICD-10-CM | POA: Insufficient documentation

## 2019-09-26 DIAGNOSIS — O99210 Obesity complicating pregnancy, unspecified trimester: Secondary | ICD-10-CM

## 2019-09-26 DIAGNOSIS — O34219 Maternal care for unspecified type scar from previous cesarean delivery: Secondary | ICD-10-CM

## 2019-09-26 HISTORY — DX: Gestational (pregnancy-induced) hypertension without significant proteinuria, third trimester: O13.3

## 2019-09-26 LAB — COMPREHENSIVE METABOLIC PANEL
ALT: 12 U/L (ref 0–44)
AST: 16 U/L (ref 15–41)
Albumin: 2.4 g/dL — ABNORMAL LOW (ref 3.5–5.0)
Alkaline Phosphatase: 125 U/L (ref 38–126)
Anion gap: 11 (ref 5–15)
BUN: 5 mg/dL — ABNORMAL LOW (ref 6–20)
CO2: 18 mmol/L — ABNORMAL LOW (ref 22–32)
Calcium: 8.6 mg/dL — ABNORMAL LOW (ref 8.9–10.3)
Chloride: 104 mmol/L (ref 98–111)
Creatinine, Ser: 0.7 mg/dL (ref 0.44–1.00)
GFR calc Af Amer: >60 mL/min (ref >60)
GFR calc non Af Amer: >60 mL/min (ref >60)
Glucose, Bld: 131 mg/dL — ABNORMAL HIGH (ref 70–99)
Potassium: 3.4 mmol/L — ABNORMAL LOW (ref 3.5–5.1)
Sodium: 133 mmol/L — ABNORMAL LOW (ref 135–145)
Total Bilirubin: <0.1 mg/dL — ABNORMAL LOW (ref 0.3–1.2)
Total Protein: 5.8 g/dL — ABNORMAL LOW (ref 6.5–8.1)

## 2019-09-26 LAB — CBC
HCT: 33.5 % — ABNORMAL LOW (ref 36.0–46.0)
Hemoglobin: 10.4 g/dL — ABNORMAL LOW (ref 12.0–15.0)
MCH: 25.7 pg — ABNORMAL LOW (ref 26.0–34.0)
MCHC: 31 g/dL (ref 30.0–36.0)
MCV: 82.7 fL (ref 80.0–100.0)
Platelets: 233 10*3/uL (ref 150–400)
RBC: 4.05 MIL/uL (ref 3.87–5.11)
RDW: 13.9 % (ref 11.5–15.5)
WBC: 8.7 10*3/uL (ref 4.0–10.5)
nRBC: 0 % (ref 0.0–0.2)

## 2019-09-26 LAB — PROTEIN / CREATININE RATIO, URINE
Creatinine, Urine: 83.95 mg/dL
Protein Creatinine Ratio: 0.18 mg/mg{Cre} — ABNORMAL HIGH (ref 0.00–0.15)
Total Protein, Urine: 15 mg/dL

## 2019-09-26 NOTE — Discharge Instructions (Signed)
Hypertension During Pregnancy °High blood pressure (hypertension) is when the force of blood pumping through the arteries is too strong. Arteries are blood vessels that carry blood from the heart throughout the body. Hypertension during pregnancy can be mild or severe. Severe hypertension during pregnancy (preeclampsia) is a medical emergency that requires prompt evaluation and treatment. °Different types of hypertension can happen during pregnancy. These include: °· Chronic hypertension. This happens when you had high blood pressure before you became pregnant, and it continues during the pregnancy. Hypertension that develops before you are [redacted] weeks pregnant and continues during the pregnancy is also called chronic hypertension. If you have chronic hypertension, it will not go away after you have your baby. You will need follow-up visits with your health care provider after you have your baby. Your doctor may want you to keep taking medicine for your blood pressure. °· Gestational hypertension. This is hypertension that develops after the 20th week of pregnancy. Gestational hypertension usually goes away after you have your baby, but your health care provider will need to monitor your blood pressure to make sure that it is getting better. °· Preeclampsia. This is severe hypertension during pregnancy. This can cause serious complications for you and your baby and can also cause complications for you after the delivery of your baby. °· Postpartum preeclampsia. You may develop severe hypertension after giving birth. This usually occurs within 48 hours after childbirth but may occur up to 6 weeks after giving birth. This is rare. °How does this affect me? °Women who have hypertension during pregnancy have a greater chance of developing hypertension later in life or during future pregnancies. In some cases, hypertension during pregnancy can cause serious complications, such as: °· Stroke. °· Heart attack. °· Injury to  other organs, such as kidneys, lungs, or liver. °· Preeclampsia. °· Convulsions or seizures. °· Placental abruption. °How does this affect my baby? °Hypertension during pregnancy can affect your baby. Your baby may: °· Be born early (prematurely). °· Not weigh as much as he or she should at birth (low birth weight). °· Not tolerate labor well, leading to an unplanned cesarean delivery. °What are the risks? °There are certain factors that make it more likely for you to develop hypertension during pregnancy. These include: °· Having hypertension during a previous pregnancy. °· Being overweight. °· Being age 35 or older. °· Being pregnant for the first time. °· Being pregnant with more than one baby. °· Becoming pregnant using fertilization methods, such as IVF (in vitro fertilization). °· Having other medical problems, such as diabetes, kidney disease, or lupus. °· Having a family history of hypertension. °What can I do to lower my risk? °The exact cause of hypertension during pregnancy is not known. You may be able to lower your risk by: °· Maintaining a healthy weight. °· Eating a healthy and balanced diet. °· Following your health care provider's instructions about treating any long-term conditions that you had before becoming pregnant. °It is very important to keep all of your prenatal care appointments. Your health care provider will check your blood pressure and make sure that your pregnancy is progressing as expected. If a problem is found, early treatment can prevent complications. °How is this treated? °Treatment for hypertension during pregnancy varies depending on the type of hypertension you have and how serious it is. °· If you were taking medicine for high blood pressure before you became pregnant, talk with your health care provider. You may need to change medicine during pregnancy because   some medicines, like ACE inhibitors, may not be considered safe for your baby.  If you have gestational  hypertension, your health care provider may order medicine to treat this during pregnancy.  If you are at risk for preeclampsia, your health care provider may recommend that you take a low-dose aspirin during your pregnancy.  If you have severe hypertension, you may need to be hospitalized so you and your baby can be monitored closely. You may also need to be given medicine to lower your blood pressure. This medicine may be given by mouth or through an IV.  In some cases, if your condition gets worse, you may need to deliver your baby early. Follow these instructions at home: Eating and drinking   Drink enough fluid to keep your urine pale yellow.  Avoid caffeine. Lifestyle  Do not use any products that contain nicotine or tobacco, such as cigarettes, e-cigarettes, and chewing tobacco. If you need help quitting, ask your health care provider.  Do not use alcohol or drugs.  Avoid stress as much as possible.  Rest and get plenty of sleep.  Regular exercise can help to reduce your blood pressure. Ask your health care provider what kinds of exercise are best for you. General instructions  Take over-the-counter and prescription medicines only as told by your health care provider.  Keep all prenatal and follow-up visits as told by your health care provider. This is important. Contact a health care provider if:  You have symptoms that your health care provider told you may require more treatment or monitoring, such as: ? Headaches. ? Nausea or vomiting. ? Abdominal pain. ? Dizziness. ? Light-headedness. Get help right away if:  You have: ? Severe abdominal pain that does not get better with treatment. ? A severe headache that does not get better. ? Vomiting that does not get better. ? Sudden, rapid weight gain. ? Sudden swelling in your hands, ankles, or face. ? Vaginal bleeding. ? Blood in your urine. ? Blurred or double vision. ? Shortness of breath or chest  pain. ? Weakness on one side of your body. ? Difficulty speaking.  Your baby is not moving as much as usual. Summary  High blood pressure (hypertension) is when the force of blood pumping through the arteries is too strong.  Hypertension during pregnancy can cause problems for you and your baby.  Treatment for hypertension during pregnancy varies depending on the type of hypertension you have and how serious it is.  Keep all prenatal and follow-up visits as told by your health care provider. This is important. This information is not intended to replace advice given to you by your health care provider. Make sure you discuss any questions you have with your health care provider. Document Revised: 04/15/2018 Document Reviewed: 01/19/2018 Elsevier Patient Education  [place].     Fetal Movement Counts Patient Name: ________________________________________________ Patient Due Date: ____________________ What is a fetal movement count?  A fetal movement count is the number of times that you feel your baby move during a certain amount of time. This may also be called a fetal kick count. A fetal movement count is recommended for every pregnant woman. You may be asked to start counting fetal movements as early as week 28 of your pregnancy. Pay attention to when your baby is most active. You may notice your baby's sleep and wake cycles. You may also notice things that make your baby move more. You should do a fetal movement count:  When  your baby is normally most active.  At the same time each day. A good time to count movements is while you are resting, after having something to eat and drink. How do I count fetal movements? 1. Find a quiet, comfortable area. Sit, or lie down on your side. 2. Write down the date, the start time and stop time, and the number of movements that you felt between those two times. Take this information with you to your health care visits. 3. Write down  your start time when you feel the first movement. 4. Count kicks, flutters, swishes, rolls, and jabs. You should feel at least 10 movements. 5. You may stop counting after you have felt 10 movements, or if you have been counting for 2 hours. Write down the stop time. 6. If you do not feel 10 movements in 2 hours, contact your health care provider for further instructions. Your health care provider may want to do additional tests to assess your baby's well-being. Contact a health care provider if:  You feel fewer than 10 movements in 2 hours.  Your baby is not moving like he or she usually does. Date: ____________ Start time: ____________ Stop time: ____________ Movements: ____________ Date: ____________ Start time: ____________ Stop time: ____________ Movements: ____________ Date: ____________ Start time: ____________ Stop time: ____________ Movements: ____________ Date: ____________ Start time: ____________ Stop time: ____________ Movements: ____________ Date: ____________ Start time: ____________ Stop time: ____________ Movements: ____________ Date: ____________ Start time: ____________ Stop time: ____________ Movements: ____________ Date: ____________ Start time: ____________ Stop time: ____________ Movements: ____________ Date: ____________ Start time: ____________ Stop time: ____________ Movements: ____________ Date: ____________ Start time: ____________ Stop time: ____________ Movements: ____________ This information is not intended to replace advice given to you by your health care provider. Make sure you discuss any questions you have with your health care provider. Document Revised: 08/12/2018 Document Reviewed: 08/12/2018 Elsevier Patient Education  [place].

## 2019-09-26 NOTE — MAU Note (Signed)
Went to dr for rtn appt, BP was elevated x2. Hx of pre e, so sent in for eval. Denies HA, visual changes, or epigastric pain, does reports increased swelling in hands and feet. No VB or LOF, reports +FM.

## 2019-09-26 NOTE — MAU Provider Note (Signed)
Chief Complaint:  Hypertension and Foot Swelling   First Provider Initiated Contact with Patient 09/26/19 1314     HPI: Kathy Flores is a 29 y.o. G4P3003 at [redacted]w[redacted]d who presents to maternity admissions reporting hypertension. Was seen in the office this morning & had 2 elevated BPs. Sent here for further evaluation. Has history of preeclampsia with a previous pregnancy. Denies headache, visual disturbance, or epigastric pain. Good fetal movement.    Past Medical History:  Diagnosis Date   Asthma    inhaler used 3x month   Depression    Fibroid    Transient hypertension of pregnancy in third trimester 09/26/2019   Trichomonas infection    Vaginal Pap smear, abnormal    OB History  Gravida Para Term Preterm AB Living  4 3 3     3   SAB TAB Ectopic Multiple Live Births        0 3    # Outcome Date GA Lbr Len/2nd Weight Sex Delivery Anes PTL Lv  4 Current           3 Term 11/23/13 [redacted]w[redacted]d  3485 g F CS-LTranv EPI  LIV     Complications: Chorioamnionitis  2 Term 01/30/06 [redacted]w[redacted]d  2268 g F Vag-Spont   LIV  1 Term 11/13/04 [redacted]w[redacted]d  2268 g F Vag-Spont   LIV   Past Surgical History:  Procedure Laterality Date   CESAREAN SECTION N/A 11/23/2013   Procedure: CESAREAN SECTION;  Surgeon: Woodroe Mode, MD;  Location: [facility];  Service: Obstetrics;  Laterality: N/A;   Family History  Problem Relation Age of Onset   Asthma Sister    Healthy Mother    Healthy Father    Social History   Tobacco Use   Smoking status: Former Smoker   Smokeless tobacco: Never Used  Scientific laboratory technician Use: Never used  Substance Use Topics   Alcohol use: No   Drug use: No   Allergies  Allergen Reactions   Keflet [Cephalexin] Itching   Penicillins Hives and Rash    Has patient had a PCN reaction causing immediate rash, facial/tongue/throat swelling, SOB or lightheadedness with hypotension: Yes Has patient had a PCN reaction causing severe rash involving mucus membranes or skin necrosis:  No Has patient had a PCN reaction that required hospitalization No Has patient had a PCN reaction occurring within the last 10 years: No If all of the above answers are "NO", then may proceed with Cephalosporin use.    Medications Prior to Admission  Medication Sig Dispense Refill Last Dose   albuterol (VENTOLIN HFA) 108 (90 Base) MCG/ACT inhaler Inhale 2 puffs into the lungs every 6 (six) hours as needed for wheezing or shortness of breath. 18 g 3 09/25/2019 at Unknown time   aspirin EC 81 MG tablet Take 1 tablet (81 mg total) by mouth daily. Take after 12 weeks for prevention of preeclampsia later in pregnancy 300 tablet 0 09/25/2019 at Unknown time   budesonide-formoterol (SYMBICORT) 160-4.5 MCG/ACT inhaler Inhale 2 puffs into the lungs in the morning and at bedtime. 1 Inhaler 12 09/25/2019 at Unknown time   cyclobenzaprine (FLEXERIL) 10 MG tablet Take 1 tablet (10 mg total) by mouth 2 (two) times daily as needed for muscle spasms. 20 tablet 1 09/25/2019 at Unknown time   Elastic Bandages & Supports (COMFORT FIT MATERNITY SUPP LG) MISC 1 Units by Does not apply route daily. 1 each 0 09/25/2019 at Unknown time   fluticasone (FLONASE) 50 MCG/ACT nasal  spray Place 2 sprays into both nostrils daily. 11.1 mL 0 09/25/2019 at Unknown time   Prenatal Vit-Fe Fumarate-FA (PRENATAL VITAMIN) 27-0.8 MG TABS Take 1 tablet by mouth daily. 30 tablet 12 09/25/2019 at Unknown time   zolpidem (AMBIEN) 5 MG tablet Take 1 tablet (5 mg total) by mouth at bedtime as needed for sleep. 30 tablet 0 09/25/2019 at Unknown time    I have reviewed patient's Past Medical Hx, Surgical Hx, Family Hx, Social Hx, medications and allergies.   ROS:  Review of Systems  Constitutional: Negative.   Eyes: Negative for visual disturbance.  Gastrointestinal: Negative.   Genitourinary: Negative.   Neurological: Negative for headaches.    Physical Exam   Patient Vitals for the past 24 hrs:  BP Temp Temp src Pulse Resp SpO2  Height Weight  09/26/19 1500 126/83 -- -- 95 -- -- -- --  09/26/19 1445 130/88 -- -- 94 -- -- -- --  09/26/19 1430 138/80 -- -- 93 -- -- -- --  09/26/19 1415 127/84 -- -- 94 -- -- -- --  09/26/19 1400 126/85 -- -- (!) 102 -- -- -- --  09/26/19 1346 126/84 -- -- 93 -- -- -- --  09/26/19 1330 129/87 -- -- 100 -- -- -- --  09/26/19 1316 114/69 -- -- (!) 102 -- -- -- --  09/26/19 1300 129/81 -- -- (!) 107 -- -- -- --  09/26/19 1245 131/81 -- -- 99 -- -- -- --  09/26/19 1240 136/90 -- -- (!) 108 -- -- -- --  09/26/19 1227 (!) 152/90 98.2 F (36.8 C) Oral 100 16 97 % 5\' 2"  (1.575 m) 102 kg    Constitutional: Well-developed, well-nourished female in no acute distress.  Cardiovascular: normal rate & rhythm, no murmur Respiratory: normal effort, lung sounds clear throughout GI: Abd soft, non-tender, gravid appropriate for gestational age. Pos BS x 4 MS: Extremities nontender, non pitting BLE edema, normal ROM Neurologic: Alert and oriented x 4.   Fetal Tracing:  Baseline: 140 Variability: moderate Accelerations: 15x15 Decelerations: none  Toco: irregular    Labs: Results for orders placed or performed during the hospital encounter of 09/26/19 (from the past 24 hour(s))  CBC     Status: Abnormal   Collection Time: 09/26/19  1:01 PM  Result Value Ref Range   WBC 8.7 4.0 - 10.5 K/uL   RBC 4.05 3.87 - 5.11 MIL/uL   Hemoglobin 10.4 (L) 12.0 - 15.0 g/dL   HCT 33.5 (L) 36 - 46 %   MCV 82.7 80.0 - 100.0 fL   MCH 25.7 (L) 26.0 - 34.0 pg   MCHC 31.0 30.0 - 36.0 g/dL   RDW 13.9 11.5 - 15.5 %   Platelets 233 150 - 400 K/uL   nRBC 0.0 0.0 - 0.2 %  Comprehensive metabolic panel     Status: Abnormal   Collection Time: 09/26/19  1:01 PM  Result Value Ref Range   Sodium 133 (L) 135 - 145 mmol/L   Potassium 3.4 (L) 3.5 - 5.1 mmol/L   Chloride 104 98 - 111 mmol/L   CO2 18 (L) 22 - 32 mmol/L   Glucose, Bld 131 (H) 70 - 99 mg/dL   BUN 5 (L) 6 - 20 mg/dL   Creatinine, Ser 0.70 0.44 - 1.00  mg/dL   Calcium 8.6 (L) 8.9 - 10.3 mg/dL   Total Protein 5.8 (L) 6.5 - 8.1 g/dL   Albumin 2.4 (L) 3.5 - 5.0 g/dL   AST 16 15 -  41 U/L   ALT 12 0 - 44 U/L   Alkaline Phosphatase 125 38 - 126 U/L   Total Bilirubin <0.1 (L) 0.3 - 1.2 mg/dL   GFR calc non Af Amer >60 >60 mL/min   GFR calc Af Amer >60 >60 mL/min   Anion gap 11 5 - 15  Protein / creatinine ratio, urine     Status: Abnormal   Collection Time: 09/26/19  1:09 PM  Result Value Ref Range   Creatinine, Urine 83.95 mg/dL   Total Protein, Urine 15 mg/dL   Protein Creatinine Ratio 0.18 (H) 0.00 - 0.15 mg/mg[Cre]    Imaging:  No results found.  MAU Course: Orders Placed This Encounter  Procedures   CBC   Comprehensive metabolic panel   Protein / creatinine ratio, urine   Discharge patient   No orders of the defined types were placed in this encounter.   MDM: Elevated BP x 2. Remained normotensive for rest of encounter. Does not have elevated BPs 4 hours apart. No s/s of preeclampsia. PEC labs are normal. Reviewed patient with Dr. Rosana Hoes; will get BP check tomorrow morning.   Assessment: 1. Elevated BP without diagnosis of hypertension   2. [redacted] weeks gestation of pregnancy     Plan: Discharge home in stable condition.  Called [facility] & scheduled for BP check tomorrow morning Preeclampsia precautions reviewed with patient    Allergies as of 09/26/2019      Reactions   Keflet [cephalexin] Itching   Penicillins Hives, Rash   Has patient had a PCN reaction causing immediate rash, facial/tongue/throat swelling, SOB or lightheadedness with hypotension: Yes Has patient had a PCN reaction causing severe rash involving mucus membranes or skin necrosis: No Has patient had a PCN reaction that required hospitalization No Has patient had a PCN reaction occurring within the last 10 years: No If all of the above answers are "NO", then may proceed with Cephalosporin use.      Medication List    TAKE these medications    albuterol 108 (90 Base) MCG/ACT inhaler Commonly known as: VENTOLIN HFA Inhale 2 puffs into the lungs every 6 (six) hours as needed for wheezing or shortness of breath.   aspirin EC 81 MG tablet Take 1 tablet (81 mg total) by mouth daily. Take after 12 weeks for prevention of preeclampsia later in pregnancy   budesonide-formoterol 160-4.5 MCG/ACT inhaler Commonly known as: Symbicort Inhale 2 puffs into the lungs in the morning and at bedtime.   Comfort Fit Maternity Supp Lg Misc 1 Units by Does not apply route daily.   cyclobenzaprine 10 MG tablet Commonly known as: FLEXERIL Take 1 tablet (10 mg total) by mouth 2 (two) times daily as needed for muscle spasms.   fluticasone 50 MCG/ACT nasal spray Commonly known as: FLONASE Place 2 sprays into both nostrils daily.   Prenatal Vitamin 27-0.8 MG Tabs Take 1 tablet by mouth daily.   zolpidem 5 MG tablet Commonly known as: AMBIEN Take 1 tablet (5 mg total) by mouth at bedtime as needed for sleep.       Jorje Guild, NP 09/26/2019 3:14 PM

## 2019-09-26 NOTE — Progress Notes (Signed)
Prenatal Visit Note Date: 09/26/2019 Clinic: [facility]  Subjective:  Kathy Flores is a 29 y.o. [phone] at [redacted]w[redacted]d being seen today for ongoing prenatal care.  She is currently monitored for the following issues for this high-risk pregnancy and has Normochromic normocytic anemia; Moderate persistent asthma with exacerbation; Asthma affecting pregnancy in third trimester; Supervision of high risk pregnancy, antepartum; Bipolar depression ([place]); History of cesarean section complicating pregnancy; Late prenatal care affecting pregnancy in second trimester; Obesity during pregnancy, antepartum; Primary insomnia; Abnormal fetal ultrasound; Uterine fibroid in antepartum period; Alpha thalassemia silent carrier; Carrier of fragile X syndrome; Group B streptococcal infection during pregnancy; BMI 40.0-44.9, adult ([place]); Transient hypertension of pregnancy in third trimester; and History of gestational hypertension on their problem list.  Patient reports no s/s of pre-eclampsia.   Contractions: Irritability. Vag. Bleeding: None.  Movement: Present. Denies leaking of fluid.   The following portions of the patient's history were reviewed and updated as appropriate: allergies, current medications, past family history, past medical history, past social history, past surgical history and problem list. Problem list updated.  Objective:   Vitals:   09/26/19 1054 09/26/19 1113  BP: (!) 139/95 (!) 144/99  Pulse: 97   Weight: 223 lb 4.8 oz (101.3 kg)     Fetal Status: Fetal Heart Rate (bpm): 157 Fundal Height: 38 cm Movement: Present  Presentation: Vertex  General:  Alert, oriented and cooperative. Patient is in no acute distress.  Skin: Skin is warm and dry. No rash noted.   Cardiovascular: Normal heart rate noted  Respiratory: Normal respiratory effort, no problems with respiration noted  Abdomen: Soft, gravid, appropriate for gestational age. Pain/Pressure: Present     Pelvic:   Cervical exam performed Dilation: 1.5 Effacement (%): 50 Station: Ballotable  Extremities: Normal range of motion.  Edema: Trace  Mental Status: Normal mood and affect. Normal behavior. Normal judgment and thought content.   Urinalysis:      Assessment and Plan:  Pregnancy: G4P3003 at [redacted]w[redacted]d  1. Asthma affecting pregnancy in third trimester Doing well on no meds  2. Supervision of high risk pregnancy, antepartum Routine care  3. History of cesarean section complicating pregnancy Desires tolac. H/o SVD prior to c-section. Consent already signed  4. Abnormal fetal ultrasound Let peds know about fetal gallstones and minimal pericardial effusion on 08/2019 u/s  5. Group B streptococcal infection during pregnancy tx in labor  6. BMI 40.0-44.9, adult ([place])  7. Obesity during pregnancy, antepartum  8. Transient hypertension of pregnancy in third trimester No s/s of pre-eclampsia. She states that she had high blood pressure in prior pregnancies. Given history and GA, recommend going to MAU for evaluation  9. History of gestational hypertension PL updated  Term labor symptoms and general obstetric precautions including but not limited to vaginal bleeding, contractions, leaking of fluid and fetal movement were reviewed in detail with the patient. Please refer to After Visit Summary for other counseling recommendations.  No follow-ups on file.   Aletha Halim, MD

## 2019-09-27 ENCOUNTER — Inpatient Hospital Stay (HOSPITAL_COMMUNITY)
Admission: AD | Admit: 2019-09-27 | Discharge: 2019-09-30 | DRG: 807 | Disposition: A | Discharge status: Home / Self Care | Payer: Medicaid Other | Attending: Family Medicine | Admitting: Family Medicine

## 2019-09-27 ENCOUNTER — Ambulatory Visit (INDEPENDENT_AMBULATORY_CARE_PROVIDER_SITE_OTHER): Payer: Medicaid Other | Admitting: General Practice

## 2019-09-27 ENCOUNTER — Other Ambulatory Visit: Payer: Self-pay

## 2019-09-27 ENCOUNTER — Encounter (HOSPITAL_COMMUNITY): Payer: Self-pay | Admitting: Family Medicine

## 2019-09-27 VITALS — BP 130/81 | HR 113 | Ht 62.0 in | Wt 223.0 lb

## 2019-09-27 DIAGNOSIS — O99891 Other specified diseases and conditions complicating pregnancy: Secondary | ICD-10-CM | POA: Diagnosis not present

## 2019-09-27 DIAGNOSIS — O4292 Full-term premature rupture of membranes, unspecified as to length of time between rupture and onset of labor: Secondary | ICD-10-CM | POA: Diagnosis present

## 2019-09-27 DIAGNOSIS — O134 Gestational [pregnancy-induced] hypertension without significant proteinuria, complicating childbirth: Principal | ICD-10-CM | POA: Diagnosis present

## 2019-09-27 DIAGNOSIS — O358XX0 Maternal care for other (suspected) fetal abnormality and damage, not applicable or unspecified: Secondary | ICD-10-CM | POA: Diagnosis present

## 2019-09-27 DIAGNOSIS — Z880 Allergy status to penicillin: Secondary | ICD-10-CM | POA: Diagnosis not present

## 2019-09-27 DIAGNOSIS — O34219 Maternal care for unspecified type scar from previous cesarean delivery: Secondary | ICD-10-CM | POA: Diagnosis present

## 2019-09-27 DIAGNOSIS — F319 Bipolar disorder, unspecified: Secondary | ICD-10-CM

## 2019-09-27 DIAGNOSIS — Z148 Genetic carrier of other disease: Secondary | ICD-10-CM

## 2019-09-27 DIAGNOSIS — J45909 Unspecified asthma, uncomplicated: Secondary | ICD-10-CM | POA: Diagnosis present

## 2019-09-27 DIAGNOSIS — Z3A38 38 weeks gestation of pregnancy: Secondary | ICD-10-CM

## 2019-09-27 DIAGNOSIS — O0990 Supervision of high risk pregnancy, unspecified, unspecified trimester: Secondary | ICD-10-CM

## 2019-09-27 DIAGNOSIS — Z87891 Personal history of nicotine dependence: Secondary | ICD-10-CM | POA: Diagnosis not present

## 2019-09-27 DIAGNOSIS — Z23 Encounter for immunization: Secondary | ICD-10-CM

## 2019-09-27 DIAGNOSIS — O0932 Supervision of pregnancy with insufficient antenatal care, second trimester: Secondary | ICD-10-CM

## 2019-09-27 DIAGNOSIS — R030 Elevated blood-pressure reading, without diagnosis of hypertension: Secondary | ICD-10-CM | POA: Diagnosis not present

## 2019-09-27 DIAGNOSIS — O99824 Streptococcus B carrier state complicating childbirth: Secondary | ICD-10-CM | POA: Diagnosis present

## 2019-09-27 DIAGNOSIS — O9952 Diseases of the respiratory system complicating childbirth: Secondary | ICD-10-CM | POA: Diagnosis present

## 2019-09-27 DIAGNOSIS — B951 Streptococcus, group B, as the cause of diseases classified elsewhere: Secondary | ICD-10-CM | POA: Diagnosis present

## 2019-09-27 DIAGNOSIS — D563 Thalassemia minor: Secondary | ICD-10-CM | POA: Diagnosis present

## 2019-09-27 DIAGNOSIS — O99214 Obesity complicating childbirth: Secondary | ICD-10-CM | POA: Diagnosis present

## 2019-09-27 DIAGNOSIS — O4202 Full-term premature rupture of membranes, onset of labor within 24 hours of rupture: Secondary | ICD-10-CM | POA: Diagnosis not present

## 2019-09-27 DIAGNOSIS — O99210 Obesity complicating pregnancy, unspecified trimester: Secondary | ICD-10-CM | POA: Diagnosis present

## 2019-09-27 DIAGNOSIS — D649 Anemia, unspecified: Secondary | ICD-10-CM | POA: Diagnosis present

## 2019-09-27 DIAGNOSIS — Z20822 Contact with and (suspected) exposure to covid-19: Secondary | ICD-10-CM | POA: Diagnosis present

## 2019-09-27 DIAGNOSIS — Z0130 Encounter for examination of blood pressure without abnormal findings: Secondary | ICD-10-CM

## 2019-09-27 DIAGNOSIS — J4540 Moderate persistent asthma, uncomplicated: Secondary | ICD-10-CM | POA: Diagnosis present

## 2019-09-27 DIAGNOSIS — O26893 Other specified pregnancy related conditions, third trimester: Secondary | ICD-10-CM | POA: Diagnosis present

## 2019-09-27 LAB — SARS CORONAVIRUS 2 BY RT PCR (HOSPITAL ORDER, PERFORMED IN ~~LOC~~ HOSPITAL LAB): SARS Coronavirus 2: NEGATIVE

## 2019-09-27 LAB — CBC
HCT: 37.1 % (ref 36.0–46.0)
Hemoglobin: 11.6 g/dL — ABNORMAL LOW (ref 12.0–15.0)
MCH: 26 pg (ref 26.0–34.0)
MCHC: 31.3 g/dL (ref 30.0–36.0)
MCV: 83 fL (ref 80.0–100.0)
Platelets: 267 10*3/uL (ref 150–400)
RBC: 4.47 MIL/uL (ref 3.87–5.11)
RDW: 14 % (ref 11.5–15.5)
WBC: 10.8 10*3/uL — ABNORMAL HIGH (ref 4.0–10.5)
nRBC: 0 % (ref 0.0–0.2)

## 2019-09-27 LAB — COMPREHENSIVE METABOLIC PANEL
ALT: 13 U/L (ref 0–44)
AST: 18 U/L (ref 15–41)
Albumin: 2.6 g/dL — ABNORMAL LOW (ref 3.5–5.0)
Alkaline Phosphatase: 140 U/L — ABNORMAL HIGH (ref 38–126)
Anion gap: 9 (ref 5–15)
BUN: 7 mg/dL (ref 6–20)
CO2: 21 mmol/L — ABNORMAL LOW (ref 22–32)
Calcium: 9.8 mg/dL (ref 8.9–10.3)
Chloride: 105 mmol/L (ref 98–111)
Creatinine, Ser: 0.78 mg/dL (ref 0.44–1.00)
GFR calc Af Amer: >60 mL/min (ref >60)
GFR calc non Af Amer: >60 mL/min (ref >60)
Glucose, Bld: 103 mg/dL — ABNORMAL HIGH (ref 70–99)
Potassium: 4.3 mmol/L (ref 3.5–5.1)
Sodium: 135 mmol/L (ref 135–145)
Total Bilirubin: 0.3 mg/dL (ref 0.3–1.2)
Total Protein: 6.6 g/dL (ref 6.5–8.1)

## 2019-09-27 LAB — TYPE AND SCREEN
ABO/RH(D): O POS
Antibody Screen: NEGATIVE

## 2019-09-27 MED ORDER — LIDOCAINE HCL (PF) 1 % IJ SOLN: 30.0000 mL | INTRAMUSCULAR | Status: DC | PRN

## 2019-09-27 MED ORDER — ONDANSETRON HCL 4 MG/2ML IJ SOLN: 4.0000 mg | Freq: Four times a day (QID) | INTRAMUSCULAR | Status: DC | PRN

## 2019-09-27 MED ORDER — OXYTOCIN BOLUS FROM INFUSION
333.0000 mL | Freq: Once | INTRAVENOUS | Status: AC
  Administered 2019-09-28: 333 mL via INTRAVENOUS

## 2019-09-27 MED ORDER — LACTATED RINGERS IV SOLN: INTRAVENOUS | Status: DC

## 2019-09-27 MED ORDER — OXYTOCIN-SODIUM CHLORIDE 30-0.9 UT/500ML-% IV SOLN: 2.5000 [IU]/h | INTRAVENOUS | Status: DC

## 2019-09-27 MED ORDER — SOD CITRATE-CITRIC ACID 500-334 MG/5ML PO SOLN: 30.0000 mL | ORAL | Status: DC | PRN

## 2019-09-27 MED ORDER — MOMETASONE FURO-FORMOTEROL FUM 100-5 MCG/ACT IN AERO
2.0000 | INHALATION_SPRAY | Freq: Two times a day (BID) | RESPIRATORY_TRACT | Status: DC
  Administered 2019-09-28 – 2019-09-29 (×2): 2 via RESPIRATORY_TRACT
  Filled 2019-09-27: qty 8.8

## 2019-09-27 MED ORDER — CLINDAMYCIN PHOSPHATE 900 MG/50ML IV SOLN
900.0000 mg | Freq: Three times a day (TID) | INTRAVENOUS | Status: DC
  Administered 2019-09-27: 900 mg via INTRAVENOUS
  Filled 2019-09-27: qty 50

## 2019-09-27 MED ORDER — FENTANYL CITRATE (PF) 100 MCG/2ML IJ SOLN: 50.0000 ug | INTRAMUSCULAR | Status: DC | PRN

## 2019-09-27 MED ORDER — FLEET ENEMA 7-19 GM/118ML RE ENEM: 1.0000 | ENEMA | RECTAL | Status: DC | PRN

## 2019-09-27 MED ORDER — LACTATED RINGERS IV SOLN: 500.0000 mL | INTRAVENOUS | Status: DC | PRN

## 2019-09-27 MED ORDER — ACETAMINOPHEN 325 MG PO TABS: 650.0000 mg | ORAL_TABLET | ORAL | Status: DC | PRN

## 2019-09-27 NOTE — MAU Note (Signed)
Pt says water broke at 2030, contractions started before that. Not in pain now. Denies bleeding. +FM

## 2019-09-27 NOTE — Progress Notes (Signed)
Patient presents to office today for BP check today following up from elevated BP at [facility] visit yesterday. Patient denies headaches, dizziness, or blurry vision but does endorse diarrhea, clear mucous, and contractions that started at 6am- they were 2-3 minutes for an hour then 5-10 minutes apart. Patient reports difficulty sleeping & pain with contractions rated 8-9. BP is 130/81 today. Reviewed with Dr Sylvester Harder who states BP is okay today and she will perform cervical check given frequent contractions-- cervix remains unchanged from yesterday. Patient will return on Monday for scheduled OB visit and needs IOL scheduled.   Koren Bound RN BSN 09/27/19

## 2019-09-27 NOTE — H&P (Signed)
OBSTETRIC ADMISSION HISTORY AND PHYSICAL  Kathy Flores is a 29 y.o. female [phone] with IUP at [redacted]w[redacted]d by 21wk [place] presenting for leakage of fluid. Reports large gush of yellow stained fluid around 2030. Confirmed ruptured on arrival. She reports +FMs, no VB, no blurry vision, headaches or peripheral edema, and RUQ pain.  She plans on breast feeding. She request POPs for birth control. She received her prenatal care at [facility]   Poor Dating: By 21w [place] --->  Estimated Date of Delivery: 10/05/19  Sono:    @[redacted]w[redacted]d , CWD, abnormal anatomy (minimal pericardial effusion, cholelithiasis), cephalic presentation, fundal placenta,2534g, 16% EFW   Prenatal History/Complications:  History of C Section - 2015. Arrest of dilation and fetal intolerance of labor, progressed to 8.5cm. Two prior vaginal deliveries  Moderate persistent asthma (on symbicort) - admitted in May for asthma exacerbation. Has not been needing rescue inhaler.  Abnormal fetal US  GBS positive  Obesity, BMI 40 Gestational hypertension  Silent carrier alpha thal, carrier premutation fragile X    Past Medical History: Past Medical History:  Diagnosis Date  . Asthma    inhaler used 3x month  . Depression   . Fibroid   . Transient hypertension of pregnancy in third trimester 09/26/2019  . Trichomonas infection   . Vaginal Pap smear, abnormal     Past Surgical History: Past Surgical History:  Procedure Laterality Date  . CESAREAN SECTION N/A 11/23/2013   Procedure: CESAREAN SECTION;  Surgeon: Woodroe Mode, MD;  Location: [facility];  Service: Obstetrics;  Laterality: N/A;    Obstetrical History: OB History    Gravida  4   Para  3   Term  3   Preterm      AB      Living  3     SAB      TAB      Ectopic      Multiple  0   Live Births  3           Social History Social History   Socioeconomic History  . Marital status: Single    Spouse name: Not on file  . Number of children: Not on file  . Years of  education: Not on file  . Highest education level: Not on file  Occupational History  . Not on file  Tobacco Use  . Smoking status: Former Research scientist (life sciences)  . Smokeless tobacco: Never Used  Vaping Use  . Vaping Use: Never used  Substance and Sexual Activity  . Alcohol use: No  . Drug use: No  . Sexual activity: Yes    Birth control/protection: None  Other Topics Concern  . Not on file  Social History Narrative  . Not on file   Social Determinants of Health   Financial Resource Strain:   . Difficulty of Paying Living Expenses: Not on file  Food Insecurity: No Food Insecurity  . Worried About Charity fundraiser in the Last Year: Never true  . Ran Out of Food in the Last Year: Never true  Transportation Needs: No Transportation Needs  . Lack of Transportation (Medical): No  . Lack of Transportation (Non-Medical): No  Physical Activity:   . Days of Exercise per Week: Not on file  . Minutes of Exercise per Session: Not on file  Stress:   . Feeling of Stress : Not on file  Social Connections:   . Frequency of Communication with Friends and Family: Not on file  . Frequency of Social  Gatherings with Friends and Family: Not on file  . Attends Religious Services: Not on file  . Active Member of Clubs or Organizations: Not on file  . Attends Archivist Meetings: Not on file  . Marital Status: Not on file    Family History: Family History  Problem Relation Age of Onset  . Asthma Sister   . Healthy Mother   . Healthy Father     Allergies: Allergies  Allergen Reactions  . Keflet [Cephalexin] Itching  . Penicillins Hives and Rash    Has patient had a PCN reaction causing immediate rash, facial/tongue/throat swelling, SOB or lightheadedness with hypotension: Yes Has patient had a PCN reaction causing severe rash involving mucus membranes or skin necrosis: No Has patient had a PCN reaction that required hospitalization No Has patient had a PCN reaction occurring within the  last 10 years: No If all of the above answers are "NO", then may proceed with Cephalosporin use.     Medications Prior to Admission  Medication Sig Dispense Refill Last Dose  . albuterol (VENTOLIN HFA) 108 (90 Base) MCG/ACT inhaler Inhale 2 puffs into the lungs every 6 (six) hours as needed for wheezing or shortness of breath. 18 g 3 09/26/2019 at Unknown time  . aspirin EC 81 MG tablet Take 1 tablet (81 mg total) by mouth daily. Take after 12 weeks for prevention of preeclampsia later in pregnancy 300 tablet 0 09/27/2019 at Unknown time  . Prenatal Vit-Fe Fumarate-FA (PRENATAL VITAMIN) 27-0.8 MG TABS Take 1 tablet by mouth daily. 30 tablet 12 09/27/2019 at Unknown time  . budesonide-formoterol (SYMBICORT) 160-4.5 MCG/ACT inhaler Inhale 2 puffs into the lungs in the morning and at bedtime. 1 Inhaler 12   . cyclobenzaprine (FLEXERIL) 10 MG tablet Take 1 tablet (10 mg total) by mouth 2 (two) times daily as needed for muscle spasms. 20 tablet 1 Unknown at Unknown time  . Elastic Bandages & Supports (COMFORT FIT MATERNITY SUPP LG) MISC 1 Units by Does not apply route daily. 1 each 0   . fluticasone (FLONASE) 50 MCG/ACT nasal spray Place 2 sprays into both nostrils daily. 11.1 mL 0 Unknown at Unknown time  . zolpidem (AMBIEN) 5 MG tablet Take 1 tablet (5 mg total) by mouth at bedtime as needed for sleep. 30 tablet 0 Unknown at Unknown time     Review of Systems   All systems reviewed and negative except as stated in HPI  Blood pressure 131/81, pulse 90, temperature 98.9 F (37.2 C), temperature source Oral, resp. rate 18, last menstrual period 09/09/2018. General appearance: alert, cooperative and appears stated age Lungs: clear to auscultation bilaterally Heart: regular rate and rhythm Abdomen: soft, non-tender; bowel sounds normal Extremities: Homans sign is negative, no sign of DVT Presentation: cephalic confirmed on US  Fetal monitoring baseline: 135, mod variability, pos accels, neg  decels Uterine activity q3-4 min      Prenatal labs: ABO, Rh: O/Positive/-- (06/14 1617) Antibody: Negative (06/14 1617) Rubella: 3.58 (06/14 1617) RPR: Non Reactive (07/13 0831)  HBsAg: Negative (06/14 1617)  HIV: Non Reactive (07/13 0831)  GBS: Positive/-- (09/03 1132)  2 hr Glucola normal Genetic screening  Abnormal, see above Anatomy US abnormal, see above   Prenatal Transfer Tool  Maternal Diabetes: No Genetic Screening: Abnormal:  Results: Other: Maternal Ultrasounds/Referrals: Other: fetal cholelithiasis, pericardial effusion  Fetal Ultrasounds or other Referrals:  Referred to Materal Fetal Medicine  Maternal Substance Abuse:  No Significant Maternal Medications:  Meds include: Other: Symbicort Significant  Maternal Lab Results: Group B Strep positive  No results found for this or any previous visit (from the past 24 hour(s)).  Patient Active Problem List   Diagnosis Date Noted  . BMI 40.0-44.9, adult ([place]) 09/26/2019  . Transient hypertension of pregnancy in third trimester 09/26/2019  . History of gestational hypertension 09/26/2019  . Group B streptococcal infection during pregnancy 09/16/2019  . Alpha thalassemia silent carrier 09/06/2019  . Carrier of fragile X syndrome 09/06/2019  . Abnormal fetal ultrasound 08/11/2019  . Primary insomnia 07/19/2019  . Late prenatal care affecting pregnancy in second trimester 06/21/2019  . Obesity during pregnancy, antepartum 06/21/2019  . Supervision of high risk pregnancy, antepartum 06/20/2019  . Bipolar depression ([place]) 06/20/2019  . History of cesarean section complicating pregnancy 44/81/8563  . Asthma affecting pregnancy in third trimester 05/30/2019  . Moderate persistent asthma with exacerbation   . Normochromic normocytic anemia 09/23/2015  . Uterine fibroid in antepartum period 05/05/2013    Assessment/Plan:  Kathy Flores is a 29 y.o. G4P3003 at [redacted]w[redacted]d here for PROM. Admit to L&D.   #PROM/TOLAC: likely  transitioning to early labor. SVE in office 1cm, defer exam for now and will check in 3-4 hours. Can augment prn.   #gestational hypertension: had elevated BP at office visit and on arrival to L&D, at least meets criteria for gHTN. Repeat PEC labs on admission.   #moderate persistent asthma: continue inhaler, avoid hemabate   #Pain: Epidural prn  #FWB: Cat I #ID:  gbs POS, PCN allergy, clinda sensitive. Clindamycin on admission. #MOF: breast #MOC:POP's  #Circ:  N/a #fetal gallstones, heart effusion: notify peds   Janet Berlin, MD  09/27/2019, 10:30 PM

## 2019-09-28 ENCOUNTER — Inpatient Hospital Stay (HOSPITAL_COMMUNITY): Payer: Medicaid Other | Admitting: Anesthesiology

## 2019-09-28 ENCOUNTER — Encounter (HOSPITAL_COMMUNITY): Payer: Self-pay | Admitting: Family Medicine

## 2019-09-28 DIAGNOSIS — O99824 Streptococcus B carrier state complicating childbirth: Secondary | ICD-10-CM

## 2019-09-28 DIAGNOSIS — Z3A38 38 weeks gestation of pregnancy: Secondary | ICD-10-CM

## 2019-09-28 DIAGNOSIS — O4202 Full-term premature rupture of membranes, onset of labor within 24 hours of rupture: Secondary | ICD-10-CM

## 2019-09-28 LAB — RPR: RPR Ser Ql: NONREACTIVE

## 2019-09-28 MED ORDER — MEASLES, MUMPS & RUBELLA VAC IJ SOLR: 0.5000 mL | Freq: Once | INTRAMUSCULAR | Status: DC

## 2019-09-28 MED ORDER — DOCUSATE SODIUM 100 MG PO CAPS
100.0000 mg | ORAL_CAPSULE | Freq: Two times a day (BID) | ORAL | Status: DC
  Administered 2019-09-28 – 2019-09-30 (×2): 100 mg via ORAL
  Filled 2019-09-28 (×4): qty 1

## 2019-09-28 MED ORDER — SENNOSIDES-DOCUSATE SODIUM 8.6-50 MG PO TABS: 2.0000 | ORAL_TABLET | ORAL | Status: DC

## 2019-09-28 MED ORDER — EPHEDRINE 5 MG/ML INJ: 10.0000 mg | INTRAVENOUS | Status: DC | PRN

## 2019-09-28 MED ORDER — ONDANSETRON HCL 4 MG PO TABS: 4.0000 mg | ORAL_TABLET | ORAL | Status: DC | PRN

## 2019-09-28 MED ORDER — ONDANSETRON HCL 4 MG/2ML IJ SOLN: 4.0000 mg | INTRAMUSCULAR | Status: DC | PRN

## 2019-09-28 MED ORDER — SIMETHICONE 80 MG PO CHEW: 80.0000 mg | CHEWABLE_TABLET | ORAL | Status: DC | PRN

## 2019-09-28 MED ORDER — FENTANYL-BUPIVACAINE-NACL 0.5-0.125-0.9 MG/250ML-% EP SOLN
12.0000 mL/h | EPIDURAL | Status: DC | PRN
  Filled 2019-09-28: qty 250

## 2019-09-28 MED ORDER — DIPHENHYDRAMINE HCL 25 MG PO CAPS: 25.0000 mg | ORAL_CAPSULE | Freq: Four times a day (QID) | ORAL | Status: DC | PRN

## 2019-09-28 MED ORDER — TETANUS-DIPHTH-ACELL PERTUSSIS 5-2.5-18.5 LF-MCG/0.5 IM SUSP: 0.5000 mL | Freq: Once | INTRAMUSCULAR | Status: DC

## 2019-09-28 MED ORDER — OXYTOCIN-SODIUM CHLORIDE 30-0.9 UT/500ML-% IV SOLN
1.0000 m[IU]/min | INTRAVENOUS | Status: DC
  Administered 2019-09-28: 2 m[IU]/min via INTRAVENOUS
  Filled 2019-09-28: qty 500

## 2019-09-28 MED ORDER — PHENYLEPHRINE 40 MCG/ML (10ML) SYRINGE FOR IV PUSH (FOR BLOOD PRESSURE SUPPORT): 80.0000 ug | PREFILLED_SYRINGE | INTRAVENOUS | Status: DC | PRN

## 2019-09-28 MED ORDER — LIDOCAINE HCL (PF) 1 % IJ SOLN
INTRAMUSCULAR | Status: DC | PRN
  Administered 2019-09-28: 10 mL via EPIDURAL
  Administered 2019-09-28: 2 mL via EPIDURAL

## 2019-09-28 MED ORDER — WITCH HAZEL-GLYCERIN EX PADS: 1.0000 "application " | MEDICATED_PAD | CUTANEOUS | Status: DC | PRN

## 2019-09-28 MED ORDER — LACTATED RINGERS IV SOLN: 500.0000 mL | Freq: Once | INTRAVENOUS | Status: DC

## 2019-09-28 MED ORDER — COCONUT OIL OIL: 1.0000 "application " | TOPICAL_OIL | Status: DC | PRN

## 2019-09-28 MED ORDER — BUPIVACAINE HCL (PF) 0.75 % IJ SOLN
INTRAMUSCULAR | Status: DC | PRN
Start: 2019-09-28 — End: 2019-09-28
  Administered 2019-09-28: 12 mL/h via EPIDURAL

## 2019-09-28 MED ORDER — TERBUTALINE SULFATE 1 MG/ML IJ SOLN: 0.2500 mg | Freq: Once | INTRAMUSCULAR | Status: DC | PRN

## 2019-09-28 MED ORDER — BENZOCAINE-MENTHOL 20-0.5 % EX AERO
1.0000 "application " | INHALATION_SPRAY | CUTANEOUS | Status: DC | PRN
  Administered 2019-09-28: 1 via TOPICAL
  Filled 2019-09-28: qty 56

## 2019-09-28 MED ORDER — ALBUTEROL SULFATE HFA 108 (90 BASE) MCG/ACT IN AERS
2.0000 | INHALATION_SPRAY | RESPIRATORY_TRACT | Status: DC | PRN
  Administered 2019-09-29: 2 via RESPIRATORY_TRACT
  Filled 2019-09-28: qty 6.7

## 2019-09-28 MED ORDER — ACETAMINOPHEN 325 MG PO TABS
650.0000 mg | ORAL_TABLET | ORAL | Status: DC | PRN
  Administered 2019-09-28 – 2019-09-30 (×8): 650 mg via ORAL
  Filled 2019-09-28 (×8): qty 2

## 2019-09-28 MED ORDER — DIBUCAINE (PERIANAL) 1 % EX OINT: 1.0000 "application " | TOPICAL_OINTMENT | CUTANEOUS | Status: DC | PRN

## 2019-09-28 MED ORDER — IBUPROFEN 600 MG PO TABS
600.0000 mg | ORAL_TABLET | Freq: Four times a day (QID) | ORAL | Status: DC
  Administered 2019-09-28 – 2019-09-30 (×9): 600 mg via ORAL
  Filled 2019-09-28 (×9): qty 1

## 2019-09-28 MED ORDER — DIPHENHYDRAMINE HCL 50 MG/ML IJ SOLN: 12.5000 mg | INTRAMUSCULAR | Status: DC | PRN

## 2019-09-28 MED ORDER — PRENATAL MULTIVITAMIN CH
1.0000 | ORAL_TABLET | Freq: Every day | ORAL | Status: DC
  Administered 2019-09-28 – 2019-09-30 (×3): 1 via ORAL
  Filled 2019-09-28 (×3): qty 1

## 2019-09-28 NOTE — Progress Notes (Signed)
Labor Progress Note Kathy Flores is a 29 y.o. [phone] at [redacted]w[redacted]d presented for PROM  S: Feeling some of her contractions painfully but overall have spaced out   O:  BP 131/60   Pulse (!) 106   Temp 98.5 F (36.9 C) (Oral)   Resp 18   LMP 09/09/2018 (LMP Unknown) Comment: SHIELDED  SpO2 99%  EFM: 150/mod variability/pos accels/neg decels   CVE: Dilation: 2.5 Effacement (%): 60 Station: -2 Presentation: Vertex Exam by:: Kandace Parkins, RNC   A&P: 29 y.o. [id number] [redacted]w[redacted]d here with PROM  #Labor: will begin pitocin at this time  #Pain: epidural prn  #FWB: cat I #GBS positive    Janet Berlin, MD 4:07 AM

## 2019-09-28 NOTE — Anesthesia Postprocedure Evaluation (Signed)
Anesthesia Post Note  Patient: Kathy Flores  Procedure(s) Performed: AN AD HOC LABOR EPIDURAL     Patient location during evaluation: Mother Baby Anesthesia Type: Epidural Level of consciousness: awake and alert Pain management: pain level controlled Vital Signs Assessment: post-procedure vital signs reviewed and stable Respiratory status: spontaneous breathing, nonlabored ventilation and respiratory function stable Cardiovascular status: stable Postop Assessment: no headache, no backache and epidural receding Anesthetic complications: no   No complications documented.  Last Vitals:  Vitals:   09/28/19 0910 09/28/19 1010  BP: 124/72 125/84  Pulse: 89 83  Resp: 18 18  Temp: 36.9 C 37 C  SpO2: 99%     Last Pain:  Vitals:   09/28/19 1147  TempSrc:   PainSc: Asleep   Pain Goal:                   Kathy Flores

## 2019-09-28 NOTE — Anesthesia Procedure Notes (Signed)
Epidural Patient location during procedure: OB Start time: 09/28/2019 3:24 AM End time: 09/28/2019 3:34 AM  Staffing Anesthesiologist: Pervis Hocking, DO Performed: anesthesiologist   Preanesthetic Checklist Completed: patient identified, IV checked, risks and benefits discussed, monitors and equipment checked, pre-op evaluation and timeout performed  Epidural Patient position: sitting Prep: DuraPrep and site prepped and draped Patient monitoring: continuous pulse ox, blood pressure, heart rate and cardiac monitor Approach: midline Location: L3-L4 Injection technique: LOR air  Needle:  Needle type: Tuohy  Needle gauge: 17 G Needle length: 9 cm Needle insertion depth: 7 cm Catheter type: closed end flexible Catheter size: 19 Gauge Catheter at skin depth: 12 cm Test dose: negative  Assessment Sensory level: T8 Events: blood not aspirated, injection not painful, no injection resistance, no paresthesia and negative IV test  Additional Notes Patient identified. Risks/Benefits/Options discussed with patient including but not limited to bleeding, infection, nerve damage, paralysis, failed block, incomplete pain control, headache, blood pressure changes, nausea, vomiting, reactions to medication both or allergic, itching and postpartum back pain. Confirmed with bedside nurse the patient's most recent platelet count. Confirmed with patient that they are not currently taking any anticoagulation, have any bleeding history or any family history of bleeding disorders. Patient expressed understanding and wished to proceed. All questions were answered. Sterile technique was used throughout the entire procedure. Please see nursing notes for vital signs. Test dose was given through epidural catheter and negative prior to continuing to dose epidural or start infusion. Warning signs of high block given to the patient including shortness of breath, tingling/numbness in hands, complete motor  block, or any concerning symptoms with instructions to call for help. Patient was given instructions on fall risk and not to get out of bed. All questions and concerns addressed with instructions to call with any issues or inadequate analgesia.  Reason for block:procedure for pain

## 2019-09-28 NOTE — Discharge Summary (Addendum)
Postpartum Discharge Summary    Patient Name: Kathy Flores DOB: [birth year] MRN: [id number]  Date of admission: 09/27/2019 Delivery date:09/28/2019  Delivering provider: Janet Berlin  Date of discharge: 09/29/2019  Admitting diagnosis: Normal labor [O80, Z37.9] Intrauterine pregnancy: [redacted]w[redacted]d     Secondary diagnosis:  Active Problems:   Asthma affecting pregnancy in third trimester   History of cesarean section complicating pregnancy   Normal labor   Vaginal delivery      Discharge diagnosis: Term Pregnancy Delivered and Gestational Hypertension                                              Post partum procedures:none Augmentation: Pitocin Complications: None  Hospital course: Onset of Labor With Vaginal Delivery      29 y.o. yo D5H2992 at [redacted]w[redacted]d was admitted in Latent Labor on 09/27/2019. Patient had an uncomplicated labor course as follows:  Membrane Rupture Time/Date: 8:30 PM ,09/27/2019   Delivery Method:VBAC, Spontaneous  Episiotomy:   Lacerations:  None  Patient had an uncomplicated postpartum course.  She is ambulating, tolerating a regular diet, passing flatus, and urinating well. Patient is discharged home in stable condition on 09/29/19.  Newborn Data: Birth date:09/28/2019  Birth time:7:11 AM  Gender:Female  Living status:Living  Apgars:3 ,7  Weight:2800 g   Magnesium Sulfate received: No BMZ received: No Rhophylac:No MMR:N/A T-DaP:Given prenatally Flu: No Transfusion:No  Physical exam  Vitals:   09/28/19 1400 09/28/19 1800 09/28/19 2159 09/29/19 0506  BP: 133/72 122/68 129/66 120/62  Pulse: 78 85 96 93  Resp: 18 17    Temp: 97.6 F (36.4 C) 98 F (36.7 C) (!) 97.5 F (36.4 C) 98.5 F (36.9 C)  TempSrc: Oral Oral Oral Oral  SpO2: 100% 100% 100%   Weight:      Height:       General: alert, cooperative and no distress Lochia: appropriate Uterine Fundus: firm Incision: Healing well with no significant drainage DVT Evaluation: No evidence  of DVT seen on physical exam. Labs: Lab Results  Component Value Date   WBC 10.8 (H) 09/27/2019   HGB 11.6 (L) 09/27/2019   HCT 37.1 09/27/2019   MCV 83.0 09/27/2019   PLT 267 09/27/2019   CMP Latest Ref Rng & Units 09/27/2019  Glucose 70 - 99 mg/dL 103(H)  BUN 6 - 20 mg/dL 7  Creatinine 0.44 - 1.00 mg/dL 0.78  Sodium 135 - 145 mmol/L 135  Potassium 3.5 - 5.1 mmol/L 4.3  Chloride 98 - 111 mmol/L 105  CO2 22 - 32 mmol/L 21(L)  Calcium 8.9 - 10.3 mg/dL 9.8  Total Protein 6.5 - 8.1 g/dL 6.6  Total Bilirubin 0.3 - 1.2 mg/dL 0.3  Alkaline Phos 38 - 126 U/L 140(H)  AST 15 - 41 U/L 18  ALT 0 - 44 U/L 13   Edinburgh Score: Edinburgh Postnatal Depression Scale Screening Tool 09/29/2019  I have been able to laugh and see the funny side of things. 0  I have looked forward with enjoyment to things. 0  I have blamed myself unnecessarily when things went wrong. 0  I have been anxious or worried for no good reason. 0  I have felt scared or panicky for no good reason. 0  Things have been getting on top of me. 0  I have been so unhappy that I have had difficulty sleeping.  0  I have felt sad or miserable. 0  I have been so unhappy that I have been crying. 0  The thought of harming myself has occurred to me. 0  Edinburgh Postnatal Depression Scale Total 0     After visit meds:  Allergies as of 09/29/2019      Reactions   Keflet [cephalexin] Itching   Penicillins Hives, Rash   Has patient had a PCN reaction causing immediate rash, facial/tongue/throat swelling, SOB or lightheadedness with hypotension: Yes Has patient had a PCN reaction causing severe rash involving mucus membranes or skin necrosis: No Has patient had a PCN reaction that required hospitalization No Has patient had a PCN reaction occurring within the last 10 years: No If all of the above answers are "NO", then may proceed with Cephalosporin use.      Medication List    TAKE these medications   acetaminophen 325 MG  tablet Commonly known as: Tylenol Take 2 tablets (650 mg total) by mouth every 4 (four) hours as needed (for pain scale < 4).   albuterol 108 (90 Base) MCG/ACT inhaler Commonly known as: VENTOLIN HFA Inhale 2 puffs into the lungs every 6 (six) hours as needed for wheezing or shortness of breath.   aspirin EC 81 MG tablet Take 1 tablet (81 mg total) by mouth daily. Take after 12 weeks for prevention of preeclampsia later in pregnancy   budesonide-formoterol 160-4.5 MCG/ACT inhaler Commonly known as: Symbicort Inhale 2 puffs into the lungs in the morning and at bedtime.   Comfort Fit Maternity Supp Lg Misc 1 Units by Does not apply route daily.   cyclobenzaprine 10 MG tablet Commonly known as: FLEXERIL Take 1 tablet (10 mg total) by mouth 2 (two) times daily as needed for muscle spasms.   fluticasone 50 MCG/ACT nasal spray Commonly known as: FLONASE Place 2 sprays into both nostrils daily.   ibuprofen 600 MG tablet Commonly known as: ADVIL Take 1 tablet (600 mg total) by mouth every 6 (six) hours.   norethindrone 0.35 MG tablet Commonly known as: MICRONOR Take 1 tablet (0.35 mg total) by mouth daily.   Prenatal Vitamin 27-0.8 MG Tabs Take 1 tablet by mouth daily.   zolpidem 5 MG tablet Commonly known as: AMBIEN Take 1 tablet (5 mg total) by mouth at bedtime as needed for sleep.        Discharge home in stable condition Infant Feeding: No evidence of DVT seen on physical exam. Negative Homan's sign. No cords or calf tenderness. Infant Disposition:home with mother Discharge instruction: per After Visit Summary and Postpartum booklet. Activity: Advance as tolerated. Pelvic rest for 6 weeks.  Diet: routine diet Future Appointments: Future Appointments  Date Time Provider [place]  10/05/2019  9:20 AM [facility] NURSE [facility]  10/26/2019 10:55 AM Nugent, Gerrie Nordmann, NP [facility]   Follow up Visit:   Please schedule this patient for a In person  postpartum visit in 6 weeks with the following provider: Any provider. Additional Postpartum F/U:BP check 1 week  High risk pregnancy complicated by: HTN and asthma Delivery mode:  VBAC, Spontaneous  Anticipated Birth Control:  POPs   09/29/2019 Janet Berlin, MD

## 2019-09-28 NOTE — Anesthesia Preprocedure Evaluation (Signed)
Anesthesia Evaluation  Patient identified by MRN, date of birth, ID band Patient awake    Reviewed: Allergy & Precautions, Patient's Chart, lab work & pertinent test results  Airway Mallampati: II  TM Distance: >3 FB Neck ROM: Full    Dental no notable dental hx.    Pulmonary asthma , former smoker,  Inhalers: albuterol, symbicort Albuterol use 3x/mo   Pulmonary exam normal breath sounds clear to auscultation       Cardiovascular hypertension (PIH), Normal cardiovascular exam Rhythm:Regular Rate:Normal     Neuro/Psych PSYCHIATRIC DISORDERS Depression Bipolar Disorder negative neurological ROS     GI/Hepatic negative GI ROS, Neg liver ROS,   Endo/Other  Morbid obesityBMI 41  Renal/GU negative Renal ROS  Female GU complaint (uterine fibroid)     Musculoskeletal negative musculoskeletal ROS (+)   Abdominal (+) + obese,   Peds negative pediatric ROS (+)  Hematology hct 37.1, plt 267   Anesthesia Other Findings   Reproductive/Obstetrics (+) Pregnancy                             Anesthesia Physical Anesthesia Plan  ASA: III and emergent  Anesthesia Plan: Epidural   Post-op Pain Management:    Induction:   PONV Risk Score and Plan: 2  Airway Management Planned: Natural Airway  Additional Equipment: None  Intra-op Plan:   Post-operative Plan:   Informed Consent: I have reviewed the patients History and Physical, chart, labs and discussed the procedure including the risks, benefits and alternatives for the proposed anesthesia with the patient or authorized representative who has indicated his/her understanding and acceptance.       Plan Discussed with:   Anesthesia Plan Comments:         Anesthesia Quick Evaluation

## 2019-09-29 ENCOUNTER — Inpatient Hospital Stay: Payer: Medicaid Other

## 2019-09-29 ENCOUNTER — Ambulatory Visit: Payer: Medicaid Other

## 2019-09-29 DIAGNOSIS — Z23 Encounter for immunization: Secondary | ICD-10-CM

## 2019-09-29 MED ORDER — IBUPROFEN 600 MG PO TABS
600.0000 mg | ORAL_TABLET | Freq: Four times a day (QID) | ORAL | 0 refills | Status: DC
Start: 2019-09-29 — End: 2019-09-30

## 2019-09-29 MED ORDER — ACETAMINOPHEN 325 MG PO TABS
650.0000 mg | ORAL_TABLET | ORAL | 0 refills | Status: DC | PRN
Start: 2019-09-29 — End: 2019-09-30

## 2019-09-29 MED ORDER — NORETHINDRONE 0.35 MG PO TABS: 1.0000 | ORAL_TABLET | Freq: Every day | ORAL | 11 refills | Status: DC

## 2019-09-29 NOTE — Progress Notes (Signed)
   Covid-19 Vaccination Clinic  Name:  Kathy Flores    MRN: [id number] DOB: Sep 09, 1990  09/29/2019  [name] was observed post Covid-19 immunization for 15 minutes without incident. She was provided with Vaccine Information Sheet and instruction to access the V-Safe system.   [name] was instructed to call 911 with any severe reactions post vaccine: Marland Kitchen Difficulty breathing  . Swelling of face and throat  . A fast heartbeat  . A bad rash all over body  . Dizziness and weakness   Immunizations Administered    Name Date Dose VIS Date Route   Pfizer COVID-19 Vaccine 09/29/2019 11:01 AM 0.3 mL 03/02/2018 Intramuscular   Manufacturer: [employer], [employer]   Lot: [id number]   [place]: [id number]

## 2019-09-29 NOTE — Progress Notes (Addendum)
Patient ID: Kathy Flores, female   DOB: 08/12/1990, 28 y.o.   MRN: [id number] Post Partum Day 1  Subjective: Kathy Flores is a 28 y.o. G4P4004 on PPD 1 s/p VBAC s/p AROM. She has a history of gestational HTN and has had normal blood pressures since delivery. She denies headache, visual changes, chest pain. She has been able to have a bowel movement and void without difficulty. She is tolerating PO intake well. She denies any leg pain or swelling. She denies any significant pain and pelvic discomfort has improved since delivery. She denies significant bleeding and reports bleeding decrease over the past day.   Objective: Blood pressure 120/62, pulse 93, temperature 98.5 F (36.9 C), temperature source Oral, resp. rate 17, height 5' 2" (1.575 m), weight 101.2 kg, last menstrual period 09/09/2018, SpO2 100 %, unknown if currently breastfeeding.  Physical Exam:  General: alert, cooperative and no distress Lochia: appropriate Uterine Fundus: firm Incision: N/A DVT Evaluation: No evidence of DVT seen on physical exam. Negative Homan's sign. No cords or calf tenderness. No significant calf/ankle edema.  Recent Labs    09/26/19 1301 09/27/19 2232  HGB 10.4* 11.6*  HCT 33.5* 37.1    Assessment/Plan: #VBAC:  - doing well.   - continue routine postpartum care  #general health maintenance - received COVID vaccine today - to receive MMR vaccine today   #asthma - dulera BID  #gestational HTN  - bp has been good since delivery, will continue to monitor    LOS: 2 days   Kathy Flores 09/29/2019, 12:53 PM   I saw and evaluated the patient. I agree with the findings and the plan of care as documented in the medical student's note and have made any necessary edits above.  Julia Marsala, MD OB Family Medicine Fellow, [facility], [facility]   

## 2019-09-29 NOTE — Clinical Social Work Maternal (Signed)
CLINICAL SOCIAL WORK MATERNAL/CHILD NOTE  Patient Details  Name: Kathy Flores MRN: [id number] Date of Birth: Mar 26, 1990  Date:  09/29/2019  Clinical Social Worker Initiating Note:  Darra Lis, [place] Date/Time: Initiated:  09/29/19/0930     Child's Name:  Kathy Flores   Biological Parents:  Mother   Need for Interpreter:  None   Reason for Referral:  Behavioral Health Concerns   Address:  [place] [address]    Phone number:  [phone] (home)     Additional phone number:   Household Members/Support Persons (HM/SP):   Household Member/Support Person 1, Household Member/Support Person 2, Household Member/Support Person 3   HM/SP Name Relationship DOB or Age  HM/SP -1 Kathy Flores Daughter 11/13/2004  HM/SP -2 Kathy Flores Daughter 01/30/2006  HM/SP -3 Kathy Flores Daughter 11/23/2013  HM/SP -4        HM/SP -5        HM/SP -6        HM/SP -7        HM/SP -8          Natural Supports (not living in the home):  Friends   Professional Supports: None   Employment: Unemployed   Type of Work:     Education:  Programmer, systems   Homebound arranged:    Museum/gallery curator Resources:  Medicaid   Other Resources:  Physicist, medical , [place] Considerations Which May Impact Care:  None  Strengths:  Ability to meet basic needs , Lexicographer chosen, Home prepared for child , Psychotropic Medications   Psychotropic Medications:  Topamax      Pediatrician:    Careers adviser area  Pediatrician List:   [facility] ([address])  [place]      Pediatrician Fax Number:    Risk Factors/Current Problems:  Mental Health Concerns    Cognitive State:  Alert    Mood/Affect:  Flat    CSW Assessment: CSW received consult for history of Bipolar Depression.  CSW met with MOB to offer support  and complete assessment.    CSW introduced self and informed MOB of reason for consult. MOB asked MOB how she is feeling. MOB stated she is feeling fine. CSW asked MOB about her mental health history. MOB disclosed Bipolar Depression is her only mental health diagnosis. MOB could not recall when she was first diagnosed, but stated it was more than two years ago. CSW asked MOB if it is managed by medication. MOB stated she was on Topamax and Desyrel prior to her pregnancy, but stopped once pregnant. CSW asked MOB if she plans to start taking the medication again, MOB stated yes. CSW asked MOB if she has ever attended therapy, MOB stated yes but could not recall when she last went to therapy. CSW asked MOB if it was this year, MOB again stated she can not remember. MOB denied having any SI, HI, or being involved in any DV. MOB identified her friends as her supports.   CSW provided education regarding the baby blues period vs. perinatal mood disorders, discussed treatment and gave resources for mental health follow up if concerns arise.  CSW recommends self-evaluation during the postpartum time period using the New Mom Checklist from Postpartum Progress and encouraged MOB to contact a medical professional if symptoms are noted at  any time.    CSW provided review of Sudden Infant Death Syndrome (SIDS) precautions. MOB stated baby will sleep in a basinet once discharged.  MOB stated she has all of the essential needs for baby to discharge home, including a brand new carseat. MOB stated baby will receive follow-up care at [facility] in [facility]. MOB stated there are no transportation barriers.   MOB denied having any additional questions or needing any additional resources at this time.  CSW identifies no further need for intervention and no barriers to discharge at this time.   CSW Plan/Description:  No Further Intervention Required/No Barriers to Discharge, Perinatal Mood and Anxiety Disorder  (PMADs) Education, Sudden Infant Death Syndrome (SIDS) Education    Waylan Boga, Latanya Presser 09/29/2019, 9:48 AM

## 2019-09-30 LAB — SURGICAL PATHOLOGY

## 2019-09-30 MED ORDER — AMLODIPINE BESYLATE 5 MG PO TABS
5.0000 mg | ORAL_TABLET | Freq: Every day | ORAL | Status: DC
  Administered 2019-09-30: 5 mg via ORAL
  Filled 2019-09-30: qty 1

## 2019-09-30 MED ORDER — NORETHINDRONE 0.35 MG PO TABS: 1.0000 | ORAL_TABLET | Freq: Every day | ORAL | 11 refills | Status: DC

## 2019-09-30 MED ORDER — AMLODIPINE BESYLATE 5 MG PO TABS
5.0000 mg | ORAL_TABLET | Freq: Every day | ORAL | 1 refills | Status: DC
Start: 2019-09-30 — End: 2022-11-27

## 2019-09-30 MED ORDER — IBUPROFEN 600 MG PO TABS
600.0000 mg | ORAL_TABLET | Freq: Four times a day (QID) | ORAL | 0 refills | Status: DC
Start: 2019-09-30 — End: 2019-12-07

## 2019-09-30 MED ORDER — ACETAMINOPHEN 325 MG PO TABS: 650.0000 mg | ORAL_TABLET | ORAL | 0 refills | Status: AC | PRN

## 2019-09-30 MED FILL — ACETAMINOPHEN 325 MG TABS: 325 | 4 days supply | Qty: 30 | Fill #0

## 2019-09-30 MED FILL — AMLODIPINE BESYLATE 5 MG TA: 5 | 30 days supply | Qty: 30 | Fill #0

## 2019-09-30 MED FILL — NORETHINDRONE 0.35 MG TAB: 0.35 | 28 days supply | Qty: 28 | Fill #0

## 2019-09-30 MED FILL — IBUPROFEN 600 MG TABLET: 600 | 8 days supply | Qty: 30 | Fill #0

## 2019-09-30 NOTE — Discharge Instructions (Signed)
-continue to take prenatal vitamin while breastfeeding -tylenol and ibuprofen for pain control -take amlodipine 5mg  daily for blood pressure  Postpartum Care After Vaginal Delivery This sheet gives you information about how to care for yourself from the time you deliver your baby to up to 6-12 weeks after delivery (postpartum period). Your health care provider may also give you more specific instructions. If you have problems or questions, contact your health care provider. Follow these instructions at home: Vaginal bleeding  It is normal to have vaginal bleeding (lochia) after delivery. Wear a sanitary pad for vaginal bleeding and discharge. ? During the first week after delivery, the amount and appearance of lochia is often similar to a menstrual period. ? Over the next few weeks, it will gradually decrease to a dry, yellow-brown discharge. ? For most women, lochia stops completely by 4-6 weeks after delivery. Vaginal bleeding can vary from woman to woman.  Change your sanitary pads frequently. Watch for any changes in your flow, such as: ? A sudden increase in volume. ? A change in color. ? Large blood clots.  If you pass a blood clot from your vagina, save it and call your health care provider to discuss. Do not flush blood clots down the toilet before talking with your health care provider.  Do not use tampons or douches until your health care provider says this is safe.  If you are not breastfeeding, your period should return 6-8 weeks after delivery. If you are feeding your child breast milk only (exclusive breastfeeding), your period may not return until you stop breastfeeding. Perineal care  Keep the area between the vagina and the anus (perineum) clean and dry as told by your health care provider. Use medicated pads and pain-relieving sprays and creams as directed.  If you had a cut in the perineum (episiotomy) or a tear in the vagina, check the area for signs of infection until  you are healed. Check for: ? More redness, swelling, or pain. ? Fluid or blood coming from the cut or tear. ? Warmth. ? Pus or a bad smell.  You may be given a squirt bottle to use instead of wiping to clean the perineum area after you go to the bathroom. As you start healing, you may use the squirt bottle before wiping yourself. Make sure to wipe gently.  To relieve pain caused by an episiotomy, a tear in the vagina, or swollen veins in the anus (hemorrhoids), try taking a warm sitz bath 2-3 times a day. A sitz bath is a warm water bath that is taken while you are sitting down. The water should only come up to your hips and should cover your buttocks. Breast care  Within the first few days after delivery, your breasts may feel heavy, full, and uncomfortable (breast engorgement). Milk may also leak from your breasts. Your health care provider can suggest ways to help relieve the discomfort. Breast engorgement should go away within a few days.  If you are breastfeeding: ? Wear a bra that supports your breasts and fits you well. ? Keep your nipples clean and dry. Apply creams and ointments as told by your health care provider. ? You may need to use breast pads to absorb milk that leaks from your breasts. ? You may have uterine contractions every time you breastfeed for up to several weeks after delivery. Uterine contractions help your uterus return to its normal size. ? If you have any problems with breastfeeding, work with your health care  provider or lactation consultant.  If you are not breastfeeding: ? Avoid touching your breasts a lot. Doing this can make your breasts produce more milk. ? Wear a good-fitting bra and use cold packs to help with swelling. ? Do not squeeze out (express) milk. This causes you to make more milk. Intimacy and sexuality  Ask your health care provider when you can engage in sexual activity. This may depend on: ? Your risk of infection. ? How fast you are  healing. ? Your comfort and desire to engage in sexual activity.  You are able to get pregnant after delivery, even if you have not had your period. If desired, talk with your health care provider about methods of birth control (contraception). Medicines  Take over-the-counter and prescription medicines only as told by your health care provider.  If you were prescribed an antibiotic medicine, take it as told by your health care provider. Do not stop taking the antibiotic even if you start to feel better. Activity  Gradually return to your normal activities as told by your health care provider. Ask your health care provider what activities are safe for you.  Rest as much as possible. Try to rest or take a nap while your baby is sleeping. Eating and drinking   Drink enough fluid to keep your urine pale yellow.  Eat high-fiber foods every day. These may help prevent or relieve constipation. High-fiber foods include: ? Whole grain cereals and breads. ? Brown rice. ? Beans. ? Fresh fruits and vegetables.  Do not try to lose weight quickly by cutting back on calories.  Take your prenatal vitamins until your postpartum checkup or until your health care provider tells you it is okay to stop. Lifestyle  Do not use any products that contain nicotine or tobacco, such as cigarettes and e-cigarettes. If you need help quitting, ask your health care provider.  Do not drink alcohol, especially if you are breastfeeding. General instructions  Keep all follow-up visits for you and your baby as told by your health care provider. Most women visit their health care provider for a postpartum checkup within the first 3-6 weeks after delivery. Contact a health care provider if:  You feel unable to cope with the changes that your child brings to your life, and these feelings do not go away.  You feel unusually sad or worried.  Your breasts become red, painful, or hard.  You have a fever.  You  have trouble holding urine or keeping urine from leaking.  You have little or no interest in activities you used to enjoy.  You have not breastfed at all and you have not had a menstrual period for 12 weeks after delivery.  You have stopped breastfeeding and you have not had a menstrual period for 12 weeks after you stopped breastfeeding.  You have questions about caring for yourself or your baby.  You pass a blood clot from your vagina. Get help right away if:  You have chest pain.  You have difficulty breathing.  You have sudden, severe leg pain.  You have severe pain or cramping in your lower abdomen.  You bleed from your vagina so much that you fill more than one sanitary pad in one hour. Bleeding should not be heavier than your heaviest period.  You develop a severe headache.  You faint.  You have blurred vision or spots in your vision.  You have bad-smelling vaginal discharge.  You have thoughts about hurting yourself or your baby.  If you ever feel like you may hurt yourself or others, or have thoughts about taking your own life, get help right away. You can go to the nearest emergency department or call:  Your local emergency services (911 in the [place]  A suicide crisis helpline, such as the [facility] at 917-448-8200. This is open 24 hours a day. Summary  The period of time right after you deliver your newborn up to 6-12 weeks after delivery is called the postpartum period.  Gradually return to your normal activities as told by your health care provider.  Keep all follow-up visits for you and your baby as told by your health care provider. This information is not intended to replace advice given to you by your health care provider. Make sure you discuss any questions you have with your health care provider. Document Revised: 12/26/2016 Document Reviewed: 10/06/2016 Elsevier Patient Education  2020 [employer].

## 2019-09-30 NOTE — Lactation Note (Signed)
This note was copied from a baby's chart. Lactation Consultation Note  Patient Name: Kathy Flores [id number] Date: 09/30/2019    Infant is 69 hours old post 53 weeks. LC reached out to RN, Tish Men, states patient declined [facility] services at this time.

## 2019-09-30 NOTE — Discharge Summary (Signed)
Postpartum Discharge Summary  Date of Service updated 09/30/19     Patient Name: Kathy Flores DOB: June 29, 1990 MRN: [id number]  Date of admission: 09/27/2019 Delivery date:09/28/2019  Delivering provider: Janet Berlin  Date of discharge: 09/30/2019  Admitting diagnosis: Normal labor [O80, Z37.9] Intrauterine pregnancy: [redacted]w[redacted]d     Secondary diagnosis:  Active Problems:   Normochromic normocytic anemia   Asthma affecting pregnancy in third trimester   Supervision of high risk pregnancy, antepartum   Bipolar depression ([place])   History of cesarean section complicating pregnancy   Late prenatal care affecting pregnancy in second trimester   Obesity during pregnancy, antepartum   Alpha thalassemia silent carrier   Carrier of fragile X syndrome   Group B streptococcal infection during pregnancy   Normal labor   Vaginal delivery  Additional problems: none    Discharge diagnosis: Term Pregnancy Delivered, VBAC and Gestational Hypertension                                              Post partum procedures:none Augmentation: Pitocin Complications: None  Hospital course: Onset of Labor With Vaginal Delivery      29 y.o. yo [id number] at [redacted]w[redacted]d was admitted in Latent Labor on 09/27/2019. Patient had an uncomplicated labor course as follows:  Membrane Rupture Time/Date: 8:30 PM ,09/27/2019   Delivery Method:VBAC, Spontaneous  Episiotomy:   Lacerations:  None  Patient had an uncomplicated postpartum course.  She is ambulating, tolerating a regular diet, passing flatus, and urinating well. Patient is discharged home in stable condition on 09/30/19.  Newborn Data: Birth date:09/28/2019  Birth time:7:11 AM  Gender:Female  Living status:Living  Apgars:3 ,7  Weight:2800 g   Magnesium Sulfate received: No BMZ received: No Rhophylac:N/A MMR:N/A T-DaP:Given prenatally Flu: No Transfusion:No  Physical exam  Vitals:   09/29/19 0506 09/29/19 1500 09/30/19 0026 09/30/19 0531   BP: 120/62 134/89 (!) 131/92 129/83  Pulse: 93 79 86 93  Resp:   18 18  Temp: 98.5 F (36.9 C)  97.8 F (36.6 C) 98.7 F (37.1 C)  TempSrc: Oral  Oral Oral  SpO2:    100%  Weight:      Height:       General: alert, cooperative and no distress Lochia: appropriate Uterine Fundus: firm Incision: N/A DVT Evaluation: No evidence of DVT seen on physical exam. Labs: Lab Results  Component Value Date   WBC 10.8 (H) 09/27/2019   HGB 11.6 (L) 09/27/2019   HCT 37.1 09/27/2019   MCV 83.0 09/27/2019   PLT 267 09/27/2019   CMP Latest Ref Rng & Units 09/27/2019  Glucose 70 - 99 mg/dL 103(H)  BUN 6 - 20 mg/dL 7  Creatinine 0.44 - 1.00 mg/dL 0.78  Sodium 135 - 145 mmol/L 135  Potassium 3.5 - 5.1 mmol/L 4.3  Chloride 98 - 111 mmol/L 105  CO2 22 - 32 mmol/L 21(L)  Calcium 8.9 - 10.3 mg/dL 9.8  Total Protein 6.5 - 8.1 g/dL 6.6  Total Bilirubin 0.3 - 1.2 mg/dL 0.3  Alkaline Phos 38 - 126 U/L 140(H)  AST 15 - 41 U/L 18  ALT 0 - 44 U/L 13   Edinburgh Score: Edinburgh Postnatal Depression Scale Screening Tool 09/29/2019  I have been able to laugh and see the funny side of things. 0  I have looked forward with enjoyment to things. 0  I have blamed myself unnecessarily when things went wrong. 0  I have been anxious or worried for no good reason. 0  I have felt scared or panicky for no good reason. 0  Things have been getting on top of me. 0  I have been so unhappy that I have had difficulty sleeping. 0  I have felt sad or miserable. 0  I have been so unhappy that I have been crying. 0  The thought of harming myself has occurred to me. 0  Edinburgh Postnatal Depression Scale Total 0     After visit meds:  Allergies as of 09/30/2019      Reactions   Keflet [cephalexin] Itching   Penicillins Hives, Rash   Has patient had a PCN reaction causing immediate rash, facial/tongue/throat swelling, SOB or lightheadedness with hypotension: Yes Has patient had a PCN reaction causing severe rash  involving mucus membranes or skin necrosis: No Has patient had a PCN reaction that required hospitalization No Has patient had a PCN reaction occurring within the last 10 years: No If all of the above answers are "NO", then may proceed with Cephalosporin use.      Medication List    STOP taking these medications   aspirin EC 81 MG tablet     TAKE these medications   acetaminophen 325 MG tablet Commonly known as: Tylenol Take 2 tablets (650 mg total) by mouth every 4 (four) hours as needed (for pain scale < 4).   albuterol 108 (90 Base) MCG/ACT inhaler Commonly known as: VENTOLIN HFA Inhale 2 puffs into the lungs every 6 (six) hours as needed for wheezing or shortness of breath.   amLODipine 5 MG tablet Commonly known as: NORVASC Take 1 tablet (5 mg total) by mouth daily.   budesonide-formoterol 160-4.5 MCG/ACT inhaler Commonly known as: Symbicort Inhale 2 puffs into the lungs in the morning and at bedtime.   Comfort Fit Maternity Supp Lg Misc 1 Units by Does not apply route daily.   cyclobenzaprine 10 MG tablet Commonly known as: FLEXERIL Take 1 tablet (10 mg total) by mouth 2 (two) times daily as needed for muscle spasms.   diphenhydramine-acetaminophen 25-500 MG Tabs tablet Commonly known as: TYLENOL PM Take 2 tablets by mouth at bedtime as needed (sleep/pain).   fluticasone 50 MCG/ACT nasal spray Commonly known as: FLONASE Place 2 sprays into both nostrils daily.   ibuprofen 600 MG tablet Commonly known as: ADVIL Take 1 tablet (600 mg total) by mouth every 6 (six) hours.   norethindrone 0.35 MG tablet Commonly known as: MICRONOR Take 1 tablet (0.35 mg total) by mouth daily.   Prenatal Vitamin 27-0.8 MG Tabs Take 1 tablet by mouth daily.   zolpidem 5 MG tablet Commonly known as: AMBIEN Take 1 tablet (5 mg total) by mouth at bedtime as needed for sleep. What changed: when to take this        Discharge home in stable condition Infant Feeding: Bottle  and Breast Infant Disposition:home with mother Discharge instruction: per After Visit Summary and Postpartum booklet. Activity: Advance as tolerated. Pelvic rest for 6 weeks.  Diet: routine diet Future Appointments: Future Appointments  Date Time Provider [place]  10/05/2019  9:20 AM [facility] NURSE [facility]  10/26/2019 10:55 AM Nugent, Gerrie Nordmann, NP [facility]   Follow up Visit:   Please schedule this patient for a In person postpartum visit in 4 weeks with the following provider: Any provider. Additional Postpartum F/U:BP check 1 week  High risk  pregnancy complicated by: HTN Delivery mode:  VBAC, Spontaneous  Anticipated Birth Control:  POPs   8/88/9169 Arrie Senate, MD

## 2019-10-03 ENCOUNTER — Encounter: Payer: Medicaid Other | Admitting: Obstetrics & Gynecology

## 2019-10-05 ENCOUNTER — Inpatient Hospital Stay (HOSPITAL_COMMUNITY): Admission: AD | Admit: 2019-10-05 | Payer: Medicaid Other | Source: Home / Self Care

## 2019-10-05 ENCOUNTER — Ambulatory Visit: Payer: Self-pay

## 2019-10-11 ENCOUNTER — Emergency Department (HOSPITAL_COMMUNITY)
Admission: EM | Admit: 2019-10-11 | Discharge: 2019-10-11 | Disposition: A | Discharge status: Home / Self Care | Payer: Medicaid Other | Attending: Pediatric Emergency Medicine | Admitting: Pediatric Emergency Medicine

## 2019-10-11 ENCOUNTER — Telehealth: Payer: Self-pay | Admitting: *Deleted

## 2019-10-11 ENCOUNTER — Encounter (HOSPITAL_COMMUNITY): Payer: Self-pay

## 2019-10-11 ENCOUNTER — Other Ambulatory Visit: Payer: Self-pay

## 2019-10-11 DIAGNOSIS — Z20822 Contact with and (suspected) exposure to covid-19: Secondary | ICD-10-CM | POA: Diagnosis not present

## 2019-10-11 DIAGNOSIS — Z87891 Personal history of nicotine dependence: Secondary | ICD-10-CM | POA: Diagnosis not present

## 2019-10-11 DIAGNOSIS — J029 Acute pharyngitis, unspecified: Secondary | ICD-10-CM | POA: Insufficient documentation

## 2019-10-11 DIAGNOSIS — J45909 Unspecified asthma, uncomplicated: Secondary | ICD-10-CM | POA: Insufficient documentation

## 2019-10-11 DIAGNOSIS — R0981 Nasal congestion: Secondary | ICD-10-CM | POA: Diagnosis not present

## 2019-10-11 DIAGNOSIS — R058 Other specified cough: Secondary | ICD-10-CM | POA: Diagnosis not present

## 2019-10-11 DIAGNOSIS — Z7951 Long term (current) use of inhaled steroids: Secondary | ICD-10-CM | POA: Diagnosis not present

## 2019-10-11 LAB — RESPIRATORY PANEL BY RT PCR (FLU A&B, COVID)
Influenza A by PCR: NEGATIVE
Influenza B by PCR: NEGATIVE
SARS Coronavirus 2 by RT PCR: NEGATIVE

## 2019-10-11 NOTE — Telephone Encounter (Signed)
Patient called [facility] Covid Line to check on her COVID-19 test results.  Covid test was performed today in the ER.  Pt's notified negative COVID-19 results. Understanding verbalized.  Patient also asked what should she do if someone in the household has tested positive.    This RN explained the following:   If you were exposed to someone who was positive for COVID-19, you will need to quarantine and self-isolate for 14 days from the date of exposure.  If you must leave home or if you have to be around others please wear a mask. Please limit contact with immediate family members in the home, practice social distancing, frequent handwashing and clean hard surfaces touched frequently with household cleaning products. Members of your household will also need to quarantine for 14 days from the date of the positive test.  Please call [place] at [phone] if you have any questions or concerns.'  Patient verbalized understanding.

## 2019-10-11 NOTE — ED Triage Notes (Signed)
Per patient just wanting to be tested due to possible exposure.

## 2019-10-11 NOTE — ED Provider Notes (Signed)
[facility] Provider Note   CSN: [id number] Arrival date & time: 10/11/19  1805     History Chief Complaint  Patient presents with  . Covid Exposure    Kathy Flores is a 29 y.o. female.  29 yo F, who is 12 days s/p delivery of her newborn, presents with 4 children with COVID-like symptoms. She has been complaining of non-productive cough, runny nose and sore throat. No fever. Drinking well, normal UOP.         Past Medical History:  Diagnosis Date  . Asthma    inhaler used 3x month  . Depression   . Fibroid   . Transient hypertension of pregnancy in third trimester 09/26/2019  . Trichomonas infection   . Vaginal Pap smear, abnormal     Patient Active Problem List   Diagnosis Date Noted  . Vaginal delivery 09/29/2019  . Normal labor 09/27/2019  . BMI 40.0-44.9, adult ([place]) 09/26/2019  . Transient hypertension of pregnancy in third trimester 09/26/2019  . Gestational hypertension 09/26/2019  . Group B streptococcal infection during pregnancy 09/16/2019  . Alpha thalassemia silent carrier 09/06/2019  . Carrier of fragile X syndrome 09/06/2019  . Abnormal fetal ultrasound 08/11/2019  . Primary insomnia 07/19/2019  . Late prenatal care affecting pregnancy in second trimester 06/21/2019  . Obesity during pregnancy, antepartum 06/21/2019  . Supervision of high risk pregnancy, antepartum 06/20/2019  . Bipolar depression ([place]) 06/20/2019  . History of cesarean section complicating pregnancy 85/27/7824  . Asthma affecting pregnancy in third trimester 05/30/2019  . Moderate persistent asthma with exacerbation   . Normochromic normocytic anemia 09/23/2015  . Uterine fibroid in antepartum period 05/05/2013    Past Surgical History:  Procedure Laterality Date  . CESAREAN SECTION N/A 11/23/2013   Procedure: CESAREAN SECTION;  Surgeon: Woodroe Mode, MD;  Location: [facility];  Service: Obstetrics;  Laterality: N/A;     OB History     Gravida  4   Para  4   Term  4   Preterm      AB      Living  4     SAB      TAB      Ectopic      Multiple  0   Live Births  4           Family History  Problem Relation Age of Onset  . Asthma Sister   . Healthy Mother   . Healthy Father     Social History   Tobacco Use  . Smoking status: Former Research scientist (life sciences)  . Smokeless tobacco: Never Used  Vaping Use  . Vaping Use: Never used  Substance Use Topics  . Alcohol use: No  . Drug use: No    Home Medications Prior to Admission medications   Medication Sig Start Date End Date Taking? Authorizing Provider  acetaminophen (TYLENOL) 325 MG tablet Take 2 tablets (650 mg total) by mouth every 4 (four) hours as needed (for pain scale < 4). 09/30/19   Truett Mainland, DO  albuterol (VENTOLIN HFA) 108 (90 Base) MCG/ACT inhaler Inhale 2 puffs into the lungs every 6 (six) hours as needed for wheezing or shortness of breath. 07/19/19   Donnamae Jude, MD  amLODipine (NORVASC) 5 MG tablet Take 1 tablet (5 mg total) by mouth daily. 2/35/36   Arrie Senate, MD  budesonide-formoterol [facility]) 160-4.5 MCG/ACT inhaler Inhale 2 puffs into the lungs in the morning and at bedtime.  07/19/19   Donnamae Jude, MD  cyclobenzaprine (FLEXERIL) 10 MG tablet Take 1 tablet (10 mg total) by mouth 2 (two) times daily as needed for muscle spasms. 08/03/19   Fair, Marin Shutter, MD  diphenhydramine-acetaminophen (TYLENOL PM) 25-500 MG TABS tablet Take 2 tablets by mouth at bedtime as needed (sleep/pain).    [provider]  Elastic Bandages & Supports (COMFORT FIT MATERNITY SUPP LG) MISC 1 Units by Does not apply route daily. 06/20/19   Lajean Manes, CNM  fluticasone (FLONASE) 50 MCG/ACT nasal spray Place 2 sprays into both nostrils daily. Patient not taking: Reported on 09/29/2019 05/31/19   Nita Sells, MD  ibuprofen (ADVIL) 600 MG tablet Take 1 tablet (600 mg total) by mouth every 6 (six) hours. 09/30/19   Truett Mainland,  DO  norethindrone (MICRONOR) 0.35 MG tablet Take 1 tablet (0.35 mg total) by mouth daily. 09/30/19   Truett Mainland, DO  Prenatal Vit-Fe Fumarate-FA (PRENATAL VITAMIN) 27-0.8 MG TABS Take 1 tablet by mouth daily. Patient not taking: Reported on 09/29/2019 05/26/19   Seabron Spates, CNM  zolpidem (AMBIEN) 5 MG tablet Take 1 tablet (5 mg total) by mouth at bedtime as needed for sleep. Patient taking differently: Take 5 mg by mouth at bedtime.  09/16/19   Luvenia Redden, PA-C    Allergies    Keflet [cephalexin] and Penicillins  Review of Systems   Review of Systems  Constitutional: Negative for fever.  HENT: Positive for rhinorrhea. Negative for sore throat and trouble swallowing.   Respiratory: Positive for cough. Negative for shortness of breath.   Gastrointestinal: Negative for diarrhea, nausea and vomiting.  Musculoskeletal: Negative for neck pain.  Skin: Negative for rash.  Neurological: Negative for headaches.  All other systems reviewed and are negative.   Physical Exam Updated Vital Signs BP (!) 136/96   Pulse (!) 105   Temp 98.4 F (36.9 C) (Oral)   Resp 20   LMP 09/09/2018 (LMP Unknown) Comment: SHIELDED  SpO2 97%   Physical Exam Vitals and nursing note reviewed.  Constitutional:      General: She is not in acute distress.    Appearance: Normal appearance. She is well-developed.  HENT:     Head: Normocephalic and atraumatic.     Right Ear: Tympanic membrane, ear canal and external ear normal.     Left Ear: Tympanic membrane, ear canal and external ear normal.     Nose: Nose normal.     Mouth/Throat:     Mouth: Mucous membranes are moist.     Pharynx: Oropharynx is clear.  Eyes:     Extraocular Movements: Extraocular movements intact.     Conjunctiva/sclera: Conjunctivae normal.     Pupils: Pupils are equal, round, and reactive to light.  Cardiovascular:     Rate and Rhythm: Normal rate and regular rhythm.     Pulses: Normal pulses.     Heart sounds:  Normal heart sounds. No murmur heard.   Pulmonary:     Effort: Pulmonary effort is normal. No respiratory distress.     Breath sounds: Normal breath sounds.  Abdominal:     General: Abdomen is flat. Bowel sounds are normal.     Palpations: Abdomen is soft.     Tenderness: There is no abdominal tenderness.  Musculoskeletal:        General: Normal range of motion.     Cervical back: Normal range of motion and neck supple.  Skin:    General: Skin  is warm and dry.     Capillary Refill: Capillary refill takes less than 2 seconds.  Neurological:     General: No focal deficit present.     Mental Status: She is alert and oriented to person, place, and time. Mental status is at baseline.     GCS: GCS eye subscore is 4. GCS verbal subscore is 5. GCS motor subscore is 6.     ED Results / Procedures / Treatments   Labs (all labs ordered are listed, but only abnormal results are displayed) Labs Reviewed  RESPIRATORY PANEL BY RT PCR (FLU A&B, COVID)    EKG None  Radiology No results found.  Procedures Procedures (including critical care time)  Medications Ordered in ED Medications - No data to display  ED Course  I have reviewed the triage vital signs and the nursing notes.  Pertinent labs & imaging results that were available during my care of the patient were reviewed by me and considered in my medical decision making (see chart for details).    MDM Rules/Calculators/A&P                          29 y.o. female with cough and congestion, likely viral respiratory illness.  Symmetric lung exam, in no distress with good sats in ED. Low concern for secondary bacterial pneumonia.  Tylenol or Motrin as needed for fever or cough. Close follow up with PCP in 2 days if worsening. Return criteria provided for signs of respiratory distress. Caregiver expressed understanding of plan.    NEILA TEEM was evaluated in Emergency Department on 10/11/2019 for the symptoms described in the  history of present illness. She was evaluated in the context of the global COVID-19 pandemic, which necessitated consideration that the patient might be at risk for infection with the SARS-CoV-2 virus that causes COVID-19. Institutional protocols and algorithms that pertain to the evaluation of patients at risk for COVID-19 are in a state of rapid change based on information released by regulatory bodies including the CDC and federal and state organizations. These policies and algorithms were followed during the patient's care in the ED.  Final Clinical Impression(s) / ED Diagnoses Final diagnoses:  Close exposure to COVID-19 virus    Rx / DC Orders ED Discharge Orders    None       Anthoney Harada, NP 10/11/19 1851    Brent Bulla, MD 10/11/19 [phone]

## 2019-10-26 ENCOUNTER — Ambulatory Visit (INDEPENDENT_AMBULATORY_CARE_PROVIDER_SITE_OTHER): Payer: Medicaid Other | Admitting: Women's Health

## 2019-10-26 ENCOUNTER — Other Ambulatory Visit: Payer: Self-pay

## 2019-10-26 ENCOUNTER — Encounter: Payer: Self-pay | Admitting: Women's Health

## 2019-10-26 VITALS — BP 125/64 | HR 77 | Ht 62.0 in | Wt 216.0 lb

## 2019-10-26 DIAGNOSIS — O99893 Other specified diseases and conditions complicating puerperium: Secondary | ICD-10-CM

## 2019-10-26 DIAGNOSIS — M25551 Pain in right hip: Secondary | ICD-10-CM

## 2019-10-26 NOTE — Progress Notes (Signed)
[facility] Partum Visit Note  Kathy Flores is a 29 y.o. [phone] female who presents for a postpartum visit. She is 4 weeks postpartum following a SVD.  I have fully reviewed the prenatal and intrapartum course. The delivery was at 39.0 gestational weeks.  Anesthesia: epidural. Postpartum course has been unremarkable. Baby is doing well. Baby is feeding by breast. Bleeding no bleeding. Bowel function is normal. Bladder function is normal. Patient is not sexually active. Contraception method is oral progesterone-only contraceptive. Postpartum depression screening: Negative.   The pregnancy intention screening data noted above was reviewed. Potential methods of contraception were discussed. The patient elected to proceed with Oral Contraceptive.    Edinburgh Postnatal Depression Scale - 10/26/19 1112      Edinburgh Postnatal Depression Scale:  In the Past 7 Days   I have been able to laugh and see the funny side of things. 0    I have looked forward with enjoyment to things. 0    I have blamed myself unnecessarily when things went wrong. 0    I have been anxious or worried for no good reason. 0    I have felt scared or panicky for no good reason. 0    Things have been getting on top of me. 0    I have been so unhappy that I have had difficulty sleeping. 0    I have felt sad or miserable. 0    I have been so unhappy that I have been crying. 0    The thought of harming myself has occurred to me. 0    Edinburgh Postnatal Depression Scale Total 0            The following portions of the patient's history were reviewed and updated as appropriate: allergies, current medications, past family history, past medical history, past social history, past surgical history and problem list.  Review of Systems Pertinent items noted in HPI and remainder of comprehensive ROS otherwise negative.    Objective:  Blood pressure 125/64, pulse 77, height 5\' 2"  (1.575 m), weight 216 lb (98 kg), last  menstrual period 09/09/2018, unknown if currently breastfeeding.  General:  alert, cooperative and no distress   Breasts:  not examined  Lungs: clear to auscultation bilaterally  Heart:  regular rate and rhythm, S1, S2 normal, no murmur, click, rub or gallop  Abdomen: soft, non-tender; bowel sounds normal; no masses,  no organomegaly   Vulva:  not evaluated  Vagina: not evaluated  Cervix:  not evaluated  Corpus: not examined  Adnexa:  not evaluated  Rectal Exam: Not performed.        Assessment:    Normal postpartum exam. Pap smear not done at today's visit.   Plan:   Essential components of care per ACOG recommendations:  1.  Mood and well being: Patient with negative depression screening today. Reviewed local resources for support.  - Patient does not use tobacco. - hx of drug use? No  2. Infant care and feeding:  -Patient currently breastmilk feeding? Yes If breastmilk feeding discussed return to work and pumping. If needed, patient was provided letter for work to allow for every 2-3 hr pumping breaks, and to be granted a private location to express breastmilk and refrigerated area to store breastmilk. Reviewed importance of draining breast regularly to support lactation. -Social determinants of health (SDOH) reviewed in EPIC. No concerns  3. Sexuality, contraception and birth spacing - Patient does not want a pregnancy in the next year.  Desired family size is 4 children.  - Reviewed forms of contraception in tiered fashion. Patient desired oral contraceptives (estrogen/progesterone) today.   - Discussed birth spacing of 18 months - pt has used Loestrin in the past with success - pt advised will need to wait until 6 weeks postpartum before starting pills due to increased risk of blood clotting  4. Sleep and fatigue -Encouraged family/partner/community support of 4 hrs of uninterrupted sleep to help with mood and fatigue  5. Physical Recovery  - Discussed patients delivery  and complications - Patient had no laceration - Patient has urinary incontinence? No Patient was referred to pelvic floor PT for right hip pain and change in gait - Patient is safe to resume physical and sexual activity  6.  Health Maintenance - Last pap smear done 1 year ago and was normal per pt. - will request records  7. Chronic Disease - PCP list given  Clarisa Fling, NP [facility], [place]

## 2019-11-01 NOTE — Patient Instructions (Signed)
When using your birth control, if you experience any of the following, please call the office or report to the nearest emergency room immediately: -severe abdominal pain/weakness -chest pain/shortness of breath -the worst HA you have ever had in your life -sudden changes in vision -difficulty speaking -severe leg pain/redness/swelling Please also refer to the additional information you were given in the office today while using your birth control.       Ethinyl Estradiol; Norethindrone Acetate; Ferrous fumarate tablets or capsules What is this medicine? ETHINYL ESTRADIOL; NORETHINDRONE ACETATE; FERROUS FUMARATE (ETH in il es tra DYE ole; nor eth IN drone AS e tate; FER [place] FUE ma rate) is an oral contraceptive. The products combine two types of female hormones, an estrogen and a progestin. They are used to prevent ovulation and pregnancy. Some products are also used to treat acne in females. This medicine may be used for other purposes; ask your health care provider or pharmacist if you have questions. COMMON BRAND NAME(S): Aurovela [address] 1/20, Aurovela Fe, [place] [address] Fe, Estrostep Fe, Gildess 24 Fe, Gildess Fe 1.5/30, Gildess Fe 1/20, Hailey 24 Fe, Hailey Fe 1.5/30, Junel Fe 1.5/30, Junel Fe 1/20, Junel Fe 24, Larin Fe, Lo Loestrin Fe, Loestrin 24 Fe, Loestrin FE 1.5/30, Loestrin FE 1/20, Lomedia 24 Fe, Microgestin 24 Fe, Microgestin Fe 1.5/30, Microgestin Fe 1/20, Tarina 24 Fe, Tarina Fe 1/20, Tay[place], Tilia Fe, Tri-Legest Fe What should I tell my health care provider before I take this medicine? They need to know if you have any of these conditions:  abnormal vaginal bleeding  blood vessel disease  breast, cervical, endometrial, ovarian, liver, or uterine cancer  diabetes  gallbladder disease  heart disease or recent heart attack  high blood pressure  high cholesterol  history of blood clots  kidney disease  liver disease  migraine headaches  smoke  tobacco  stroke  systemic lupus erythematosus (SLE)  an unusual or allergic reaction to estrogens, progestins, other medicines, foods, dyes, or preservatives  pregnant or trying to get pregnant  breast-feeding How should I use this medicine? Take this medicine by mouth. To reduce nausea, this medicine may be taken with food. Follow the directions on the prescription label. Take this medicine at the same time each day and in the order directed on the package. Do not take your medicine more often than directed. A patient package insert for the product will be given with each prescription and refill. Read this sheet carefully each time. The sheet may change frequently. Contact your pediatrician regarding the use of this medicine in children. Special care may be needed. This medicine has been used in female children who have started having menstrual periods. Overdosage: If you think you have taken too much of this medicine contact a poison control center or emergency room at once. NOTE: This medicine is only for you. Do not share this medicine with others. What if I miss a dose? If you miss a dose, refer to the patient information sheet you received with your medicine for direction. If you miss more than one pill, this medicine may not be as effective and you may need to use another form of birth control. What may interact with this medicine? Do not take this medicine with the following medication:  dasabuvir; ombitasvir; paritaprevir; ritonavir  ombitasvir; paritaprevir; ritonavir This medicine may also interact with the following medications:  acetaminophen  antibiotics or medicines for infections, especially rifampin, rifabutin, rifapentine, and griseofulvin, and possibly penicillins or tetracyclines  aprepitant  ascorbic acid (vitamin C)  atorvastatin  barbiturate medicines, such as  phenobarbital  bosentan  carbamazepine  caffeine  clofibrate  cyclosporine  dantrolene  doxercalciferol  felbamate  grapefruit juice  hydrocortisone  medicines for anxiety or sleeping problems, such as diazepam or temazepam  medicines for diabetes, including pioglitazone  mineral oil  modafinil  mycophenolate  nefazodone  oxcarbazepine  phenytoin  prednisolone  ritonavir or other medicines for HIV infection or AIDS  rosuvastatin  selegiline  soy isoflavones supplements  St. John's wort  tamoxifen or raloxifene  theophylline  thyroid hormones  topiramate  warfarin This list may not describe all possible interactions. Give your health care provider a list of all the medicines, herbs, non-prescription drugs, or dietary supplements you use. Also tell them if you smoke, drink alcohol, or use illegal drugs. Some items may interact with your medicine. What should I watch for while using this medicine? Visit your doctor or health care professional for regular checks on your progress. You will need a regular breast and pelvic exam and Pap smear while on this medicine. Use an additional method of contraception during the first cycle that you take these tablets. If you have any reason to think you are pregnant, stop taking this medicine right away and contact your doctor or health care professional. If you are taking this medicine for hormone related problems, it may take several cycles of use to see improvement in your condition. Smoking increases the risk of getting a blood clot or having a stroke while you are taking birth control pills, especially if you are more than 30 years old. You are strongly advised not to smoke. This medicine can make your body retain fluid, making your fingers, hands, or ankles swell. Your blood pressure can go up. Contact your doctor or health care professional if you feel you are retaining fluid. This medicine can make you more  sensitive to the sun. Keep out of the sun. If you cannot avoid being in the sun, wear protective clothing and use sunscreen. Do not use sun lamps or tanning beds/booths. If you wear contact lenses and notice visual changes, or if the lenses begin to feel uncomfortable, consult your eye care specialist. In some women, tenderness, swelling, or minor bleeding of the gums may occur. Notify your dentist if this happens. Brushing and flossing your teeth regularly may help limit this. See your dentist regularly and inform your dentist of the medicines you are taking. If you are going to have elective surgery, you may need to stop taking this medicine before the surgery. Consult your health care professional for advice. This medicine does not protect you against HIV infection (AIDS) or any other sexually transmitted diseases. What side effects may I notice from receiving this medicine? Side effects that you should report to your doctor or health care professional as soon as possible:  allergic reactions like skin rash, itching or hives, swelling of the face, lips, or tongue  breast tissue changes or discharge  changes in vaginal bleeding during your period or between your periods  changes in vision  chest pain  confusion  coughing up blood  dizziness  feeling faint or lightheaded  headaches or migraines  leg, arm or groin pain  loss of balance or coordination  severe or sudden headaches  stomach pain (severe)  sudden shortness of breath  sudden numbness or weakness of the face, arm or leg  symptoms of vaginal infection like itching, irritation or unusual discharge  tenderness in the upper abdomen  trouble speaking or understanding  vomiting  yellowing of the eyes or skin Side effects that usually do not require medical attention (report to your doctor or health care professional if they continue or are bothersome):  breakthrough bleeding and spotting that continues beyond  the 3 initial cycles of pills  breast tenderness  mood changes, anxiety, depression, frustration, anger, or emotional outbursts  increased sensitivity to sun or ultraviolet light  nausea  skin rash, acne, or brown spots on the skin  weight gain (slight) This list may not describe all possible side effects. Call your doctor for medical advice about side effects. You may report side effects to FDA at 1-800-FDA-1088. Where should I keep my medicine? Keep out of the reach of children. Store at room temperature between 15 and 30 degrees C (59 and 86 degrees F). Throw away any unused medicine after the expiration date. NOTE: This sheet is a summary. It may not cover all possible information. If you have questions about this medicine, talk to your doctor, pharmacist, or health care provider.  2020 Elsevier/Gold Standard (2015-09-03 08:04:41)

## 2019-12-07 ENCOUNTER — Ambulatory Visit (HOSPITAL_COMMUNITY)
Admission: EM | Admit: 2019-12-07 | Discharge: 2019-12-07 | Disposition: A | Discharge status: Home / Self Care | Payer: Medicaid Other | Attending: Family Medicine | Admitting: Family Medicine

## 2019-12-07 ENCOUNTER — Other Ambulatory Visit: Payer: Self-pay

## 2019-12-07 ENCOUNTER — Encounter (HOSPITAL_COMMUNITY): Payer: Self-pay

## 2019-12-07 DIAGNOSIS — M79604 Pain in right leg: Secondary | ICD-10-CM

## 2019-12-07 MED ORDER — NAPROXEN 500 MG PO TABS: 500.0000 mg | ORAL_TABLET | Freq: Two times a day (BID) | ORAL | 0 refills | Status: DC

## 2019-12-07 MED ORDER — PREDNISONE 20 MG PO TABS: 40.0000 mg | ORAL_TABLET | Freq: Every day | ORAL | 0 refills | Status: DC

## 2019-12-07 NOTE — ED Triage Notes (Signed)
Pt presents with pain that starts at her right hip area and radiates down her right thigh X 2 months.

## 2019-12-12 NOTE — ED Provider Notes (Signed)
[place]   [id number] 12/07/19 Arrival Time: 2355  ASSESSMENT & PLAN:  1. Right leg pain     No indication for plain imaging at this time. Discussed.  Begin trial of: Meds ordered this encounter  Medications  . naproxen (NAPROSYN) 500 MG tablet    Sig: Take 1 tablet (500 mg total) by mouth 2 (two) times daily with a meal.    Dispense:  20 tablet    Refill:  0  . predniSONE (DELTASONE) 20 MG tablet    Sig: Take 2 tablets (40 mg total) by mouth daily.    Dispense:  10 tablet    Refill:  0   WBAT.  Recommend:  Follow-up Information    [place].   Why: If worsening or failing to improve as anticipated. Contact information: [address] [place] [phone]              Reviewed expectations re: course of current medical issues. Questions answered. Outlined signs and symptoms indicating need for more acute intervention. Patient verbalized understanding. After Visit Summary given.  SUBJECTIVE: History from: patient. Kathy Flores is a 29 y.o. female who reports intermittent pain around R hip that radiates to R thigh. Gradual onset approx 2 w ago. No injury/truama. Does not wake her at night. Worse with prolonged ambulation. No extremity sensation changes or weakness. OTC analgesics with little relief.  Past Surgical History:  Procedure Laterality Date  . CESAREAN SECTION N/A 11/23/2013   Procedure: CESAREAN SECTION;  Surgeon: Woodroe Mode, MD;  Location: [facility];  Service: Obstetrics;  Laterality: N/A;      OBJECTIVE:  Vitals:   12/07/19 1912  BP: 126/63  Pulse: 92  Resp: 18  Temp: 98.2 F (36.8 C)  TempSrc: Oral  SpO2: 99%    General appearance: alert; no distress HEENT: [place]; AT Neck: supple with FROM Resp: unlabored respirations Extremities: RLE: warm with well perfused appearance; poorly localized moderate tenderness over right hip area; no specific TTP over  troch bursa; without gross deformities; swelling: none; bruising: none; hip and knee ROM: normal ; normal knee exam CV: brisk extremity capillary refill of RLE; 2+ DP pulse of RLE. Skin: warm and dry; no visible rashes Neurologic: gait normal; normal sensation and strength of RLE Psychological: alert and cooperative; normal mood and affect   Allergies  Allergen Reactions  . Keflet [Cephalexin] Itching  . Penicillins Hives and Rash    Has patient had a PCN reaction causing immediate rash, facial/tongue/throat swelling, SOB or lightheadedness with hypotension: Yes Has patient had a PCN reaction causing severe rash involving mucus membranes or skin necrosis: No Has patient had a PCN reaction that required hospitalization No Has patient had a PCN reaction occurring within the last 10 years: No If all of the above answers are "NO", then may proceed with Cephalosporin use.     Past Medical History:  Diagnosis Date  . Asthma    inhaler used 3x month  . Depression   . Fibroid   . Transient hypertension of pregnancy in third trimester 09/26/2019  . Trichomonas infection   . Vaginal Pap smear, abnormal    Social History   Socioeconomic History  . Marital status: Single    Spouse name: Not on file  . Number of children: Not on file  . Years of education: Not on file  . Highest education level: Not on file  Occupational History  . Not on  file  Tobacco Use  . Smoking status: Former Research scientist (life sciences)  . Smokeless tobacco: Never Used  Vaping Use  . Vaping Use: Never used  Substance and Sexual Activity  . Alcohol use: No  . Drug use: No  . Sexual activity: Yes    Birth control/protection: None  Other Topics Concern  . Not on file  Social History Narrative  . Not on file   Social Determinants of Health   Financial Resource Strain:   . Difficulty of Paying Living Expenses: Not on file  Food Insecurity: No Food Insecurity  . Worried About Charity fundraiser in the Last Year: Never true    . Ran Out of Food in the Last Year: Never true  Transportation Needs: No Transportation Needs  . Lack of Transportation (Medical): No  . Lack of Transportation (Non-Medical): No  Physical Activity:   . Days of Exercise per Week: Not on file  . Minutes of Exercise per Session: Not on file  Stress:   . Feeling of Stress : Not on file  Social Connections:   . Frequency of Communication with Friends and Family: Not on file  . Frequency of Social Gatherings with Friends and Family: Not on file  . Attends Religious Services: Not on file  . Active Member of Clubs or Organizations: Not on file  . Attends Archivist Meetings: Not on file  . Marital Status: Not on file   Family History  Problem Relation Age of Onset  . Asthma Sister   . Healthy Mother   . Healthy Father    Past Surgical History:  Procedure Laterality Date  . CESAREAN SECTION N/A 11/23/2013   Procedure: CESAREAN SECTION;  Surgeon: Woodroe Mode, MD;  Location: [facility];  Service: Obstetrics;  Laterality: N/AVanessa Kick, MD 12/12/19 [phone]

## 2020-03-30 ENCOUNTER — Encounter (HOSPITAL_COMMUNITY): Payer: Self-pay

## 2020-03-30 ENCOUNTER — Emergency Department (HOSPITAL_COMMUNITY)
Admission: EM | Admit: 2020-03-30 | Discharge: 2020-03-30 | Disposition: A | Discharge status: Home / Self Care | Payer: Medicaid Other | Attending: Emergency Medicine | Admitting: Emergency Medicine

## 2020-03-30 ENCOUNTER — Other Ambulatory Visit: Payer: Self-pay

## 2020-03-30 DIAGNOSIS — R0602 Shortness of breath: Secondary | ICD-10-CM | POA: Diagnosis present

## 2020-03-30 DIAGNOSIS — Z5321 Procedure and treatment not carried out due to patient leaving prior to being seen by health care provider: Secondary | ICD-10-CM | POA: Diagnosis not present

## 2020-03-30 DIAGNOSIS — J45909 Unspecified asthma, uncomplicated: Secondary | ICD-10-CM | POA: Insufficient documentation

## 2020-03-30 MED ORDER — ALBUTEROL SULFATE HFA 108 (90 BASE) MCG/ACT IN AERS
2.0000 | INHALATION_SPRAY | RESPIRATORY_TRACT | Status: DC | PRN
  Administered 2020-03-30: 2 via RESPIRATORY_TRACT
  Filled 2020-03-30: qty 6.7

## 2020-03-30 NOTE — ED Notes (Signed)
Per screener, patient turned in labels and reports she is leaving.

## 2020-03-30 NOTE — ED Triage Notes (Addendum)
Patient c/o cough, SOB, asthma, and chest pain only when coughing since last night. Patient states she has been using neb treatments, and Albuterol inhaler.

## 2020-03-30 NOTE — ED Notes (Signed)
Patient called for room placement x1 without answer. 

## 2020-05-06 ENCOUNTER — Encounter (HOSPITAL_BASED_OUTPATIENT_CLINIC_OR_DEPARTMENT_OTHER): Payer: Self-pay | Admitting: Emergency Medicine

## 2020-05-06 ENCOUNTER — Emergency Department (HOSPITAL_BASED_OUTPATIENT_CLINIC_OR_DEPARTMENT_OTHER)
Admission: EM | Admit: 2020-05-06 | Discharge: 2020-05-07 | Disposition: A | Discharge status: Home / Self Care | Payer: Medicaid Other | Source: Home / Self Care | Attending: Emergency Medicine | Admitting: Emergency Medicine

## 2020-05-06 ENCOUNTER — Other Ambulatory Visit: Payer: Self-pay

## 2020-05-06 ENCOUNTER — Emergency Department (HOSPITAL_COMMUNITY)
Admission: EM | Admit: 2020-05-06 | Discharge: 2020-05-06 | Disposition: A | Discharge status: Home / Self Care | Payer: Medicaid Other | Attending: Emergency Medicine | Admitting: Emergency Medicine

## 2020-05-06 ENCOUNTER — Emergency Department (HOSPITAL_BASED_OUTPATIENT_CLINIC_OR_DEPARTMENT_OTHER): Payer: Medicaid Other

## 2020-05-06 ENCOUNTER — Encounter (HOSPITAL_COMMUNITY): Payer: Self-pay | Admitting: Emergency Medicine

## 2020-05-06 DIAGNOSIS — Z20822 Contact with and (suspected) exposure to covid-19: Secondary | ICD-10-CM | POA: Insufficient documentation

## 2020-05-06 DIAGNOSIS — R630 Anorexia: Secondary | ICD-10-CM | POA: Insufficient documentation

## 2020-05-06 DIAGNOSIS — J45909 Unspecified asthma, uncomplicated: Secondary | ICD-10-CM | POA: Insufficient documentation

## 2020-05-06 DIAGNOSIS — Z5321 Procedure and treatment not carried out due to patient leaving prior to being seen by health care provider: Secondary | ICD-10-CM | POA: Insufficient documentation

## 2020-05-06 DIAGNOSIS — R509 Fever, unspecified: Secondary | ICD-10-CM | POA: Insufficient documentation

## 2020-05-06 DIAGNOSIS — Z7951 Long term (current) use of inhaled steroids: Secondary | ICD-10-CM | POA: Insufficient documentation

## 2020-05-06 DIAGNOSIS — O99512 Diseases of the respiratory system complicating pregnancy, second trimester: Secondary | ICD-10-CM

## 2020-05-06 DIAGNOSIS — Z87891 Personal history of nicotine dependence: Secondary | ICD-10-CM | POA: Insufficient documentation

## 2020-05-06 DIAGNOSIS — R109 Unspecified abdominal pain: Secondary | ICD-10-CM | POA: Insufficient documentation

## 2020-05-06 DIAGNOSIS — R110 Nausea: Secondary | ICD-10-CM | POA: Insufficient documentation

## 2020-05-06 DIAGNOSIS — R0602 Shortness of breath: Secondary | ICD-10-CM

## 2020-05-06 LAB — RESP PANEL BY RT-PCR (FLU A&B, COVID) ARPGX2
Influenza A by PCR: NEGATIVE
Influenza B by PCR: NEGATIVE
SARS Coronavirus 2 by RT PCR: NEGATIVE

## 2020-05-06 MED ORDER — ALBUTEROL SULFATE HFA 108 (90 BASE) MCG/ACT IN AERS: 2.0000 | INHALATION_SPRAY | RESPIRATORY_TRACT | 3 refills | Status: DC | PRN

## 2020-05-06 MED ORDER — METHYLPREDNISOLONE SODIUM SUCC 125 MG IJ SOLR
125.0000 mg | Freq: Once | INTRAMUSCULAR | Status: AC
  Administered 2020-05-06: 125 mg via INTRAVENOUS
  Filled 2020-05-06: qty 2

## 2020-05-06 MED ORDER — MAGNESIUM SULFATE 2 GM/50ML IV SOLN: 2.0000 g | Freq: Once | INTRAVENOUS | Status: DC

## 2020-05-06 MED ORDER — ALBUTEROL SULFATE HFA 108 (90 BASE) MCG/ACT IN AERS
8.0000 | INHALATION_SPRAY | Freq: Once | RESPIRATORY_TRACT | Status: AC
  Administered 2020-05-06: 8 via RESPIRATORY_TRACT
  Filled 2020-05-06: qty 6.7

## 2020-05-06 MED ORDER — ONDANSETRON HCL 4 MG/2ML IJ SOLN
4.0000 mg | Freq: Once | INTRAMUSCULAR | Status: AC
  Administered 2020-05-06: 4 mg via INTRAVENOUS
  Filled 2020-05-06: qty 2

## 2020-05-06 MED ORDER — IPRATROPIUM-ALBUTEROL 0.5-2.5 (3) MG/3ML IN SOLN
3.0000 mL | RESPIRATORY_TRACT | Status: AC
  Administered 2020-05-06 (×3): 3 mL via RESPIRATORY_TRACT
  Filled 2020-05-06 (×3): qty 3

## 2020-05-06 MED ORDER — LACTATED RINGERS IV BOLUS
1000.0000 mL | Freq: Once | INTRAVENOUS | Status: AC
  Administered 2020-05-06: 1000 mL via INTRAVENOUS

## 2020-05-06 MED ORDER — MAGNESIUM SULFATE 50 % IJ SOLN
2.0000 g | Freq: Once | INTRAMUSCULAR | Status: AC
  Administered 2020-05-06: 2 g via INTRAVENOUS
  Filled 2020-05-06: qty 4

## 2020-05-06 MED ORDER — PREDNISONE 10 MG PO TABS: 40.0000 mg | ORAL_TABLET | Freq: Every day | ORAL | 0 refills | Status: AC

## 2020-05-06 NOTE — ED Notes (Signed)
Patient ambulated patient per request of D. Patient resting oxygen saturation 98%./ RR 17. Patient walked at normal gait without difficulty while maintaining oxygen saturation of 98-93%. Patient able to speak in complete sentences without difficulty. Physician notified.

## 2020-05-06 NOTE — ED Triage Notes (Signed)
Reports nausea, with abdominal pain for the last two days and SOB.  Went to [facility] initially but waited several hours and hadn't been seen.

## 2020-05-06 NOTE — ED Notes (Signed)
Placed on cont cardiac monitor with cont POX, positioned in high fowlers. Family at bedside

## 2020-05-06 NOTE — ED Provider Notes (Signed)
[facility] Provider Note   CSN: [id number] Arrival date & time: 05/06/20  2056     History Chief Complaint  Patient presents with  . Shortness of Breath    Kathy Flores is a 30 y.o. female.   Shortness of Breath Severity:  Severe Onset quality:  Gradual Timing:  Constant Progression:  Worsening Chronicity:  Recurrent Context: known allergens and URI   Relieved by:  Nothing Worsened by:  Nothing Ineffective treatments: home inhaler. Associated symptoms: cough and fever (subjective)   Associated symptoms: no abdominal pain, no chest pain, no headaches, no rash and no vomiting        Past Medical History:  Diagnosis Date  . Asthma    inhaler used 3x month  . Depression   . Fibroid   . Transient hypertension of pregnancy in third trimester 09/26/2019  . Trichomonas infection   . Vaginal Pap smear, abnormal     Patient Active Problem List   Diagnosis Date Noted  . BMI 40.0-44.9, adult ([place]) 09/26/2019  . Gestational hypertension 09/26/2019  . Alpha thalassemia silent carrier 09/06/2019  . Carrier of fragile X syndrome 09/06/2019  . Primary insomnia 07/19/2019  . Bipolar depression ([place]) 06/20/2019  . History of cesarean section complicating pregnancy 78/29/5621  . Moderate persistent asthma with exacerbation   . Normochromic normocytic anemia 09/23/2015  . Uterine fibroid in antepartum period 05/05/2013    Past Surgical History:  Procedure Laterality Date  . CESAREAN SECTION N/A 11/23/2013   Procedure: CESAREAN SECTION;  Surgeon: Woodroe Mode, MD;  Location: [facility];  Service: Obstetrics;  Laterality: N/A;     OB History    Gravida  4   Para  4   Term  4   Preterm      AB      Living  4     SAB      IAB      Ectopic      Multiple  0   Live Births  4           Family History  Problem Relation Age of Onset  . Asthma Sister   . Healthy Mother   . Healthy Father     Social History    Tobacco Use  . Smoking status: Former Research scientist (life sciences)  . Smokeless tobacco: Never Used  Vaping Use  . Vaping Use: Never used  Substance Use Topics  . Alcohol use: No  . Drug use: No    Home Medications Prior to Admission medications   Medication Sig Start Date End Date Taking? Authorizing Provider  predniSONE (DELTASONE) 10 MG tablet Take 4 tablets (40 mg total) by mouth daily for 4 days. 05/06/20 05/10/20 Yes Breck Coons, MD  acetaminophen (TYLENOL) 325 MG tablet Take 2 tablets (650 mg total) by mouth every 4 (four) hours as needed (for pain scale < 4). 09/30/19   Truett Mainland, DO  albuterol (VENTOLIN HFA) 108 (90 Base) MCG/ACT inhaler Inhale 2 puffs into the lungs every 4 (four) hours as needed for wheezing or shortness of breath. 05/06/20   Breck Coons, MD  amLODipine (NORVASC) 5 MG tablet Take 1 tablet (5 mg total) by mouth daily. 03/14/63   Arrie Senate, MD  budesonide-formoterol [facility]) 160-4.5 MCG/ACT inhaler Inhale 2 puffs into the lungs in the morning and at bedtime. 07/19/19   Donnamae Jude, MD  cyclobenzaprine (FLEXERIL) 10 MG tablet Take 1 tablet (10 mg total) by mouth 2 (  two) times daily as needed for muscle spasms. 08/03/19   Fair, Marin Shutter, MD  diphenhydramine-acetaminophen (TYLENOL PM) 25-500 MG TABS tablet Take 2 tablets by mouth at bedtime as needed (sleep/pain).    [provider]  Elastic Bandages & Supports (COMFORT FIT MATERNITY SUPP LG) MISC 1 Units by Does not apply route daily. 06/20/19   Lajean Manes, CNM  fluticasone (FLONASE) 50 MCG/ACT nasal spray Place 2 sprays into both nostrils daily. Patient not taking: Reported on 09/29/2019 05/31/19   Nita Sells, MD  naproxen (NAPROSYN) 500 MG tablet Take 1 tablet (500 mg total) by mouth 2 (two) times daily with a meal. 12/07/19   Vanessa Kick, MD  norethindrone (MICRONOR) 0.35 MG tablet Take 1 tablet (0.35 mg total) by mouth daily. 09/30/19   Truett Mainland, DO  predniSONE (DELTASONE) 20 MG  tablet Take 2 tablets (40 mg total) by mouth daily. 12/07/19   Vanessa Kick, MD  Prenatal Vit-Fe Fumarate-FA (PRENATAL VITAMIN) 27-0.8 MG TABS Take 1 tablet by mouth daily. Patient not taking: Reported on 09/29/2019 05/26/19   Seabron Spates, CNM  zolpidem (AMBIEN) 5 MG tablet Take 1 tablet (5 mg total) by mouth at bedtime as needed for sleep. Patient taking differently: Take 5 mg by mouth at bedtime.  09/16/19   Luvenia Redden, PA-C    Allergies    Keflet [cephalexin] and Penicillins  Review of Systems   Review of Systems  Constitutional: Positive for fever (subjective). Negative for chills.  HENT: Negative for congestion and rhinorrhea.   Respiratory: Positive for cough and shortness of breath.   Cardiovascular: Negative for chest pain and palpitations.  Gastrointestinal: Positive for nausea. Negative for abdominal pain, diarrhea and vomiting.  Genitourinary: Negative for difficulty urinating and dysuria.  Musculoskeletal: Negative for arthralgias and back pain.  Skin: Negative for rash and wound.  Neurological: Negative for light-headedness and headaches.    Physical Exam Updated Vital Signs BP 124/73   Pulse 100   Temp 99 F (37.2 C) (Oral)   Resp (!) 24   Ht 5\' 3"  (1.6 m)   Wt 104.3 kg   LMP 01/18/2020 (Approximate) Comment: neg preg test today  SpO2 97%   BMI 40.73 kg/m   Physical Exam Vitals and nursing note reviewed. Exam conducted with a chaperone present.  Constitutional:      General: She is not in acute distress.    Appearance: Normal appearance.  HENT:     Head: Normocephalic and atraumatic.     Nose: No rhinorrhea.  Eyes:     General:        Right eye: No discharge.        Left eye: No discharge.     Conjunctiva/sclera: Conjunctivae normal.  Cardiovascular:     Rate and Rhythm: Normal rate and regular rhythm.  Pulmonary:     Effort: Tachypnea and accessory muscle usage present.     Breath sounds: No stridor. Decreased breath sounds and wheezing  present.  Abdominal:     General: Abdomen is flat. There is no distension.     Palpations: Abdomen is soft.  Musculoskeletal:        General: No tenderness or signs of injury.  Skin:    General: Skin is warm and dry.  Neurological:     General: No focal deficit present.     Mental Status: She is alert. Mental status is at baseline.     Motor: No weakness.  Psychiatric:  Mood and Affect: Mood normal.        Behavior: Behavior normal.     ED Results / Procedures / Treatments   Labs (all labs ordered are listed, but only abnormal results are displayed) Labs Reviewed  RESP PANEL BY RT-PCR (FLU A&B, COVID) ARPGX2    EKG None  Radiology DG Chest Port 1 View  Result Date: 05/06/2020 CLINICAL DATA:  Abdominal pain, nausea, shortness of breath with thick yellow secretions, ex-smoker, asthmatic, history of bronchitis EXAM: PORTABLE CHEST 1 VIEW COMPARISON:  Radiograph 05/29/2019 FINDINGS: Low volumes and atelectasis. No consolidation, features of edema, pneumothorax, or effusion. Pulmonary vascularity is normally distributed. The cardiomediastinal contours are unremarkable. No acute osseous or soft tissue abnormality. Telemetry leads overlie the chest. IMPRESSION: Low volumes and atelectasis without other acute cardiopulmonary abnormality. Electronically Signed   By: Lovena Le M.D.   On: 05/06/2020 22:25    Procedures Procedures   Medications Ordered in ED Medications  albuterol (VENTOLIN HFA) 108 (90 Base) MCG/ACT inhaler 8 puff (8 puffs Inhalation Given 05/06/20 2116)  ipratropium-albuterol (DUONEB) 0.5-2.5 (3) MG/3ML nebulizer solution 3 mL (3 mLs Nebulization Given 05/06/20 2155)  methylPREDNISolone sodium succinate (SOLU-MEDROL) 125 mg/2 mL injection 125 mg (125 mg Intravenous Given 05/06/20 2159)  lactated ringers bolus 1,000 mL (1,000 mLs Intravenous New Bag/Given 05/06/20 2147)  ondansetron (ZOFRAN) injection 4 mg (4 mg Intravenous Given 05/06/20 2200)  magnesium sulfate (IV  Push/IM) injection 2 g (2 g Intravenous Given 05/06/20 2158)    ED Course  I have reviewed the triage vital signs and the nursing notes.  Pertinent labs & imaging results that were available during my care of the patient were reviewed by me and considered in my medical decision making (see chart for details).    MDM Rules/Calculators/A&P                          Concern for possible infectious source causing reactive airway disease exacerbation.  Having nausea having trouble keeping some things down.  Will give IV fluids antiemetics will give breathing treatments will give steroids.  Will give magnesium will get imaging and viral testing.  Will give supportive care as well.  After breathing treatments are given the patient has significant resolution of symptoms.  Repeat examination shows still inspiratory and expiratory wheezing however the patient feels back to her baseline.  She is ambulated with her respiratory therapist without shortness of breath and without hypoxia.  She feels safe for discharge home.  She is given inhaler steroids and outpatient follow-up recommendations.  Viral screens are negative.  Chest x-ray after reviewed by radiology myself shows no acute abnormality.  Final Clinical Impression(s) / ED Diagnoses Final diagnoses:  Uncomplicated asthma, unspecified asthma severity, unspecified whether persistent    Rx / DC Orders ED Discharge Orders         Ordered    albuterol (VENTOLIN HFA) 108 (90 Base) MCG/ACT inhaler  Every 4 hours PRN        05/06/20 2353    predniSONE (DELTASONE) 10 MG tablet  Daily        05/06/20 2354           Breck Coons, MD 05/06/20 2355

## 2020-05-06 NOTE — ED Notes (Signed)
Presents with shortness of breath, noted to be worse when attempting to talk. She states she has not felt well for the past several days, having prod cough of yellow thick sec. Denies having a fever.

## 2020-05-06 NOTE — ED Triage Notes (Signed)
Patient reports nausea with abdominal pain x2 days with decreased appetite. Denies diarrhea, cough, fever.

## 2020-05-07 NOTE — ED Notes (Signed)
Pt verbalized understanding to pick up prescriptions at pharmacy listed on d/c instructions.  °

## 2020-08-28 ENCOUNTER — Ambulatory Visit: Payer: Medicaid Other | Admitting: Nurse Practitioner

## 2020-10-10 ENCOUNTER — Emergency Department (HOSPITAL_COMMUNITY)
Admission: EM | Admit: 2020-10-10 | Discharge: 2020-10-10 | Disposition: A | Discharge status: Home / Self Care | Payer: Medicaid Other | Source: Home / Self Care | Attending: Emergency Medicine | Admitting: Emergency Medicine

## 2020-10-10 ENCOUNTER — Emergency Department (HOSPITAL_BASED_OUTPATIENT_CLINIC_OR_DEPARTMENT_OTHER)
Admission: EM | Admit: 2020-10-10 | Discharge: 2020-10-10 | Discharge status: Against Medical Advice | Payer: Medicaid Other | Attending: Emergency Medicine | Admitting: Emergency Medicine

## 2020-10-10 ENCOUNTER — Other Ambulatory Visit: Payer: Self-pay

## 2020-10-10 ENCOUNTER — Encounter (HOSPITAL_COMMUNITY): Payer: Self-pay

## 2020-10-10 DIAGNOSIS — J029 Acute pharyngitis, unspecified: Secondary | ICD-10-CM | POA: Insufficient documentation

## 2020-10-10 DIAGNOSIS — Z87891 Personal history of nicotine dependence: Secondary | ICD-10-CM | POA: Diagnosis not present

## 2020-10-10 DIAGNOSIS — Z20822 Contact with and (suspected) exposure to covid-19: Secondary | ICD-10-CM | POA: Insufficient documentation

## 2020-10-10 DIAGNOSIS — R509 Fever, unspecified: Secondary | ICD-10-CM | POA: Insufficient documentation

## 2020-10-10 DIAGNOSIS — J45901 Unspecified asthma with (acute) exacerbation: Secondary | ICD-10-CM | POA: Insufficient documentation

## 2020-10-10 DIAGNOSIS — R0602 Shortness of breath: Secondary | ICD-10-CM | POA: Diagnosis present

## 2020-10-10 LAB — RESP PANEL BY RT-PCR (FLU A&B, COVID) ARPGX2
Influenza A by PCR: NEGATIVE
Influenza B by PCR: NEGATIVE
SARS Coronavirus 2 by RT PCR: NEGATIVE

## 2020-10-10 LAB — GROUP A STREP BY PCR: Group A Strep by PCR: NOT DETECTED

## 2020-10-10 MED ORDER — PREDNISONE 20 MG PO TABS
60.0000 mg | ORAL_TABLET | Freq: Once | ORAL | Status: AC
  Administered 2020-10-10: 60 mg via ORAL
  Filled 2020-10-10: qty 3

## 2020-10-10 MED ORDER — ALBUTEROL SULFATE HFA 108 (90 BASE) MCG/ACT IN AERS
2.0000 | INHALATION_SPRAY | Freq: Once | RESPIRATORY_TRACT | Status: AC
  Administered 2020-10-10: 2 via RESPIRATORY_TRACT
  Filled 2020-10-10: qty 6.7

## 2020-10-10 NOTE — ED Triage Notes (Signed)
Pt was seen at Urgent Care on Tues. For a cough and told to take prednisone. Pt complains of fever, sore throat, and SHOB since yesterday evening.

## 2020-10-10 NOTE — ED Notes (Signed)
Called to take to a treatment room  No response from lobby  Registration clerk states pt left

## 2020-10-10 NOTE — ED Triage Notes (Signed)
Pt. Was seen at Urgent Care on Tues. For cough and told to take prednisone.  Pt. Is here tonight because she feels like she needs more for her cou.gh.  Pt. Said she needs her sore throat adressed and her sore neck adessed

## 2020-10-10 NOTE — Discharge Instructions (Addendum)
You were evaluated in the Emergency Department and after careful evaluation, we did not find any emergent condition requiring admission or further testing in the hospital.  Your exam/testing today was overall reassuring.  Please take your inhalers at home and fill the prescription for prednisone.  Please return to the Emergency Department if you experience any worsening of your condition.  Thank you for allowing [place] to be a part of your care.

## 2020-10-10 NOTE — ED Provider Notes (Signed)
[facility] Emergency Department Provider Note MRN:  [id number]  Arrival date & time: 10/10/20     Chief Complaint   Shortness of Breath, Fever, Asthma, and Cough   History of Present Illness   Kathy Flores is a 30 y.o. year-old female with a history of asthma presenting to the ED with chief complaint of asthma.  Patient is endorsing a 2 or 3-day history of fever, sore throat, wheezing, shortness of breath, cough.  Was evaluated by a clinician and prescribed prednisone but she is not taking it.  She explains that she was not told what is wrong with her and so she did not want to take it.  Denies any chest pain, does endorse a hoarseness to her voice.  No headache or vision change, no abdominal pain, no leg pain or swelling.  Symptoms are mild to moderate, constant, no exacerbating or alleviating factors.  Review of Systems  A complete 10 system review of systems was obtained and all systems are negative except as noted in the HPI and PMH.   Patient's Health History    Past Medical History:  Diagnosis Date   Asthma    inhaler used 3x month   Depression    Fibroid    Transient hypertension of pregnancy in third trimester 09/26/2019   Trichomonas infection    Vaginal Pap smear, abnormal     Past Surgical History:  Procedure Laterality Date   CESAREAN SECTION N/A 11/23/2013   Procedure: CESAREAN SECTION;  Surgeon: Woodroe Mode, MD;  Location: [facility];  Service: Obstetrics;  Laterality: N/A;    Family History  Problem Relation Age of Onset   Asthma Sister    Healthy Mother    Healthy Father     Social History   Socioeconomic History   Marital status: Single    Spouse name: Not on file   Number of children: Not on file   Years of education: Not on file   Highest education level: Not on file  Occupational History   Not on file  Tobacco Use   Smoking status: Former   Smokeless tobacco: Never  Vaping Use   Vaping Use: Never used  Substance  and Sexual Activity   Alcohol use: No   Drug use: No   Sexual activity: Yes    Birth control/protection: None  Other Topics Concern   Not on file  Social History Narrative   Not on file   Social Determinants of Health   Financial Resource Strain: Not on file  Food Insecurity: Not on file  Transportation Needs: Not on file  Physical Activity: Not on file  Stress: Not on file  Social Connections: Not on file  Intimate Partner Violence: Not on file     Physical Exam   Vitals:   10/10/20 0238  BP: (!) 136/94  Pulse: 95  Resp: 18  Temp: 99.7 F (37.6 C)  SpO2: 100%    CONSTITUTIONAL: Well-appearing, NAD NEURO:  Alert and oriented x 3, no focal deficits EYES:  eyes equal and reactive ENT/NECK:  no LAD, no JVD CARDIO: Regular rate, well-perfused, normal S1 and S2 PULM: Scattered wheezes, no increased work of breathing GI/GU:  normal bowel sounds, non-distended, non-tender MSK/SPINE:  No gross deformities, no edema SKIN:  no rash, atraumatic PSYCH:  Appropriate speech and behavior  *Additional and/or pertinent findings included in MDM below  Diagnostic and Interventional Summary    EKG Interpretation  Date/Time:    Ventricular Rate:  PR Interval:    QRS Duration:   QT Interval:    QTC Calculation:   R Axis:     Text Interpretation:         Labs Reviewed  GROUP A STREP BY PCR  RESP PANEL BY RT-PCR (FLU A&B, COVID) ARPGX2    No orders to display    Medications  albuterol (VENTOLIN HFA) 108 (90 Base) MCG/ACT inhaler 2 puff (has no administration in time range)  predniSONE (DELTASONE) tablet 60 mg (has no administration in time range)     Procedures  /  Critical Care Procedures  ED Course and Medical Decision Making  I have reviewed the triage vital signs, the nursing notes, and pertinent available records from the EMR.  Listed above are laboratory and imaging tests that I personally ordered, reviewed, and interpreted and then considered in my  medical decision making (see below for details).  Favoring viral illness triggering a mild asthma exacerbation.  Swabbing for COVID and strep.  Patient is in no acute distress with normal vital signs, patient was further educated and she agrees to fill and take the prednisone medication.       Barth Kirks. [place]onia Small, [place] [email or web address]  Final Clinical Impressions(s) / ED Diagnoses     ICD-10-CM   1. Mild asthma with exacerbation, unspecified whether persistent  J45.901       ED Discharge Orders     None        Discharge Instructions Discussed with and Provided to Patient:   Discharge Instructions   None       Maudie Flakes, MD 10/10/20 [phone]

## 2020-11-24 IMAGING — US US PELVIS COMPLETE
1 series · 13 of 25 positions shown · non-contrast
Comparison: None.

CLINICAL DATA: Right lower quadrant abdomen pain

EXAM:
TRANSABDOMINAL AND TRANSVAGINAL ULTRASOUND OF PELVIS
DOPPLER ULTRASOUND OF OVARIES
TECHNIQUE: Both transabdominal and transvaginal ultrasound examinations of the
pelvis were performed. Transabdominal technique was performed for
global imaging of the pelvis including uterus, ovaries, adnexal
regions, and pelvic cul-de-sac.
It was necessary to proceed with endovaginal exam following the
transabdominal exam to visualize the ovaries. Color and duplex
Doppler ultrasound was utilized to evaluate blood flow to the
ovaries.

[Series 1: us pelvis complete · 13 of 52 slices shown]
[im 1/52]
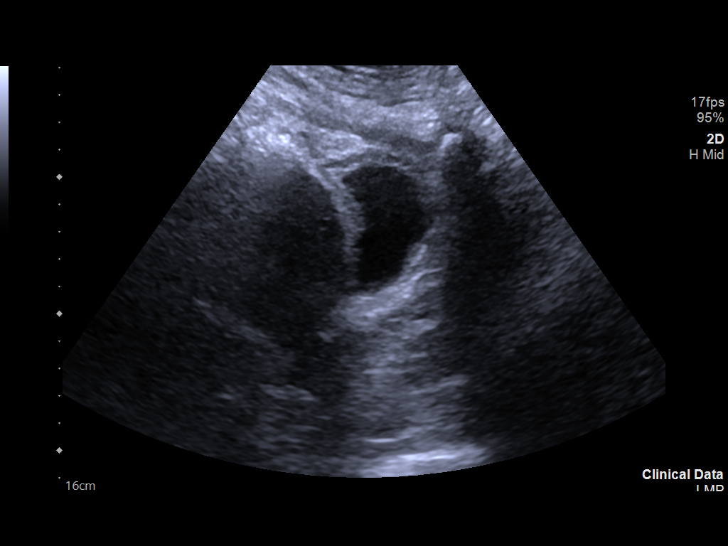
[im 5/52]
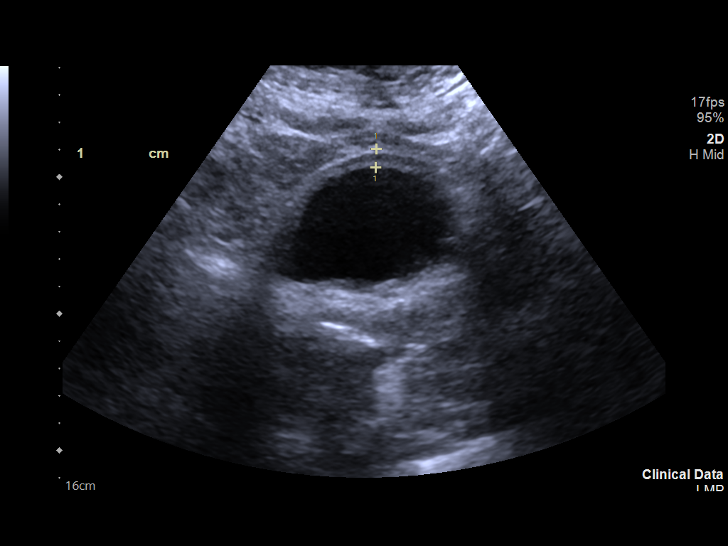
[im 9/52]
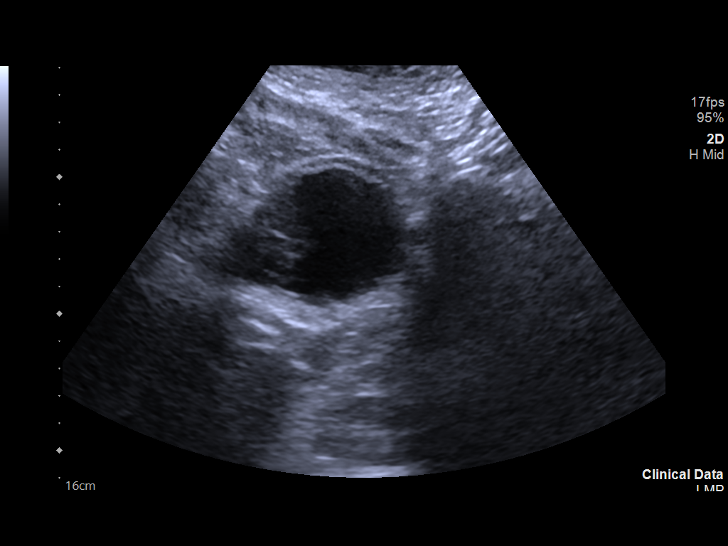
[im 13/52]
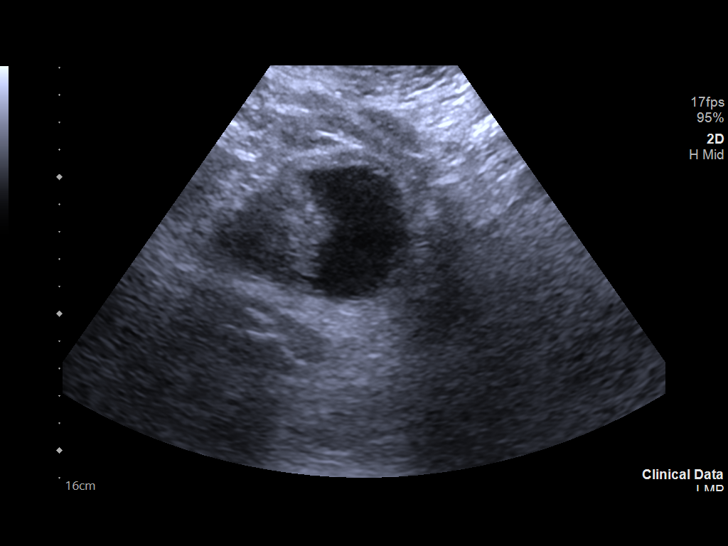
[im 18/52]
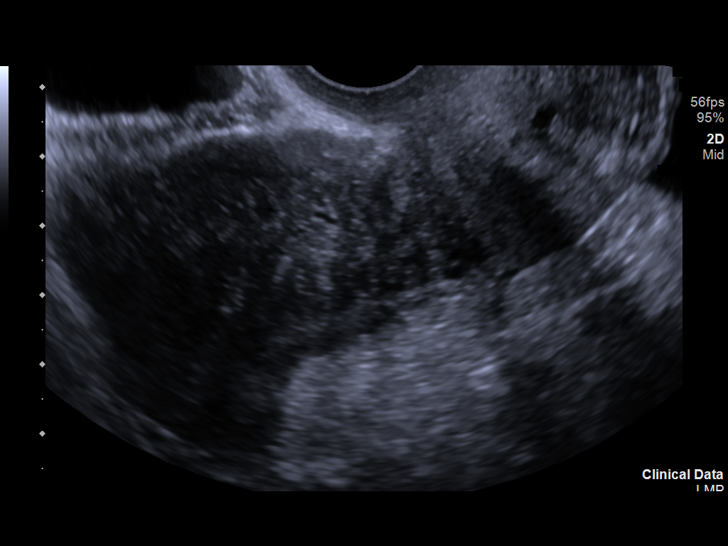
[im 22/52]
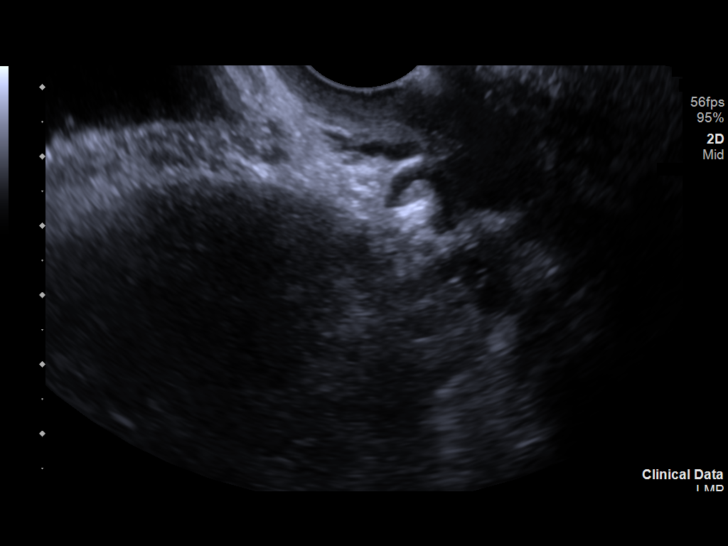
[im 26/52]
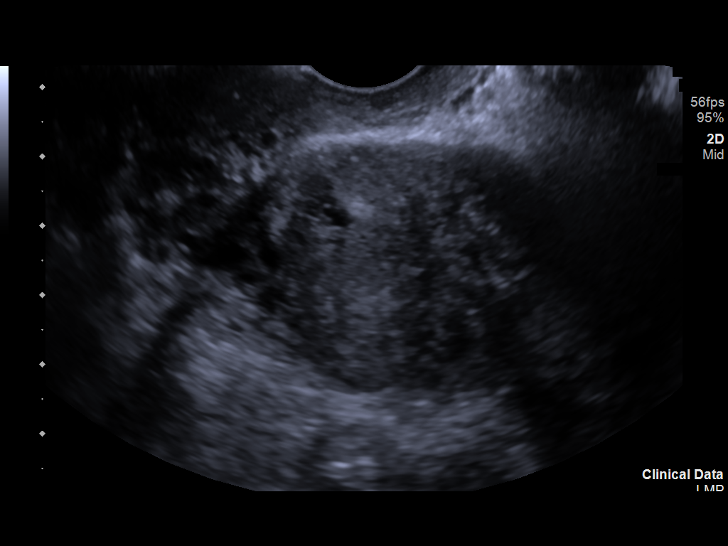
[im 30/52]
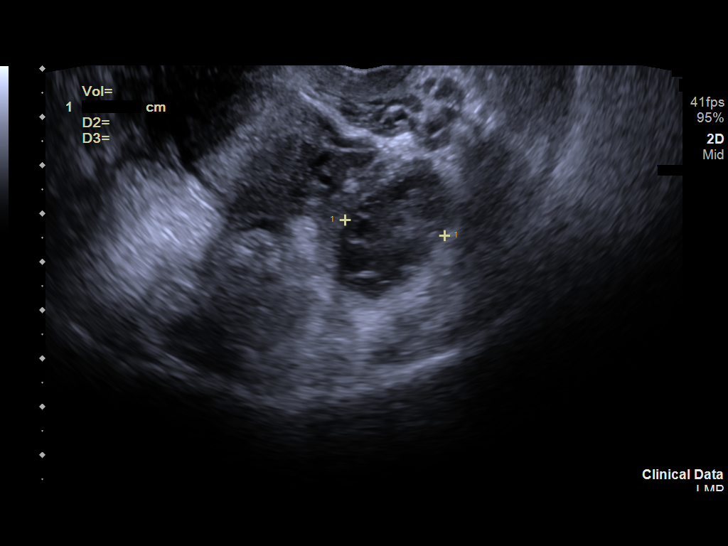
[im 35/52]
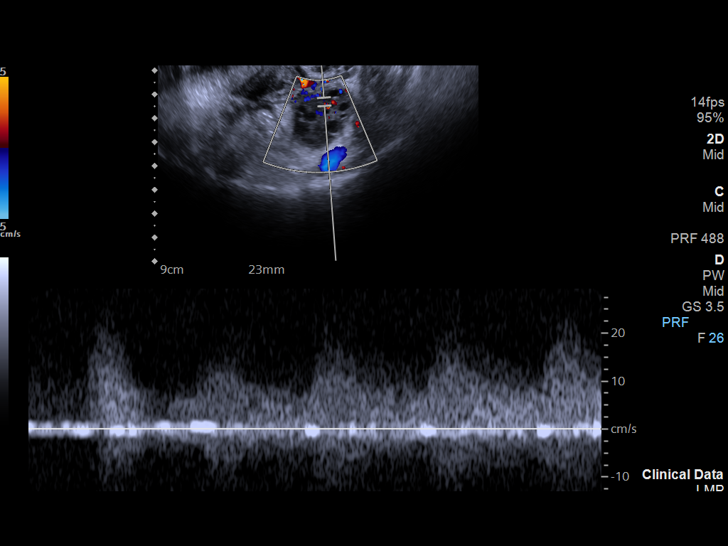
[im 39/52]
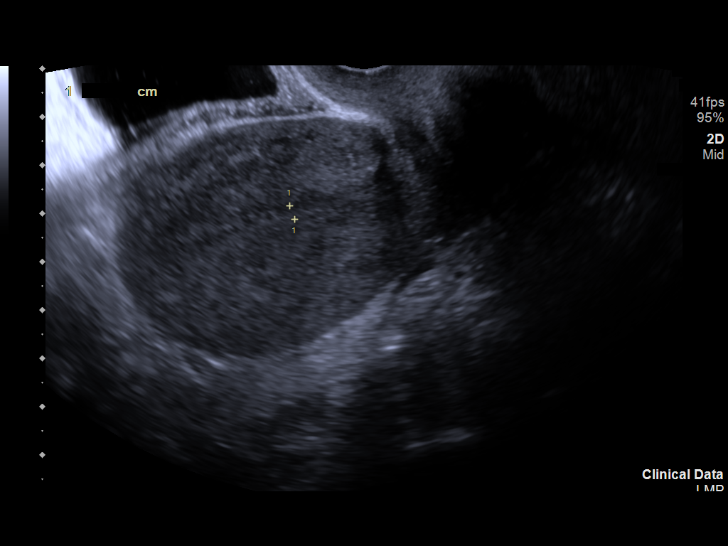
[im 43/52]
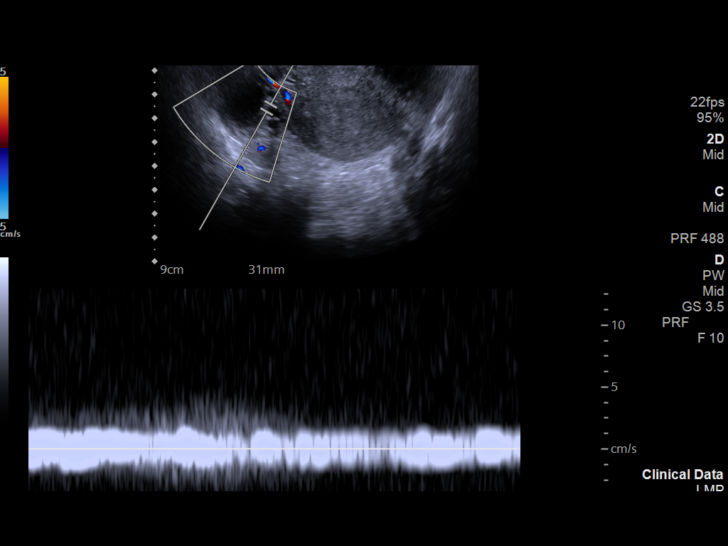
[im 47/52]
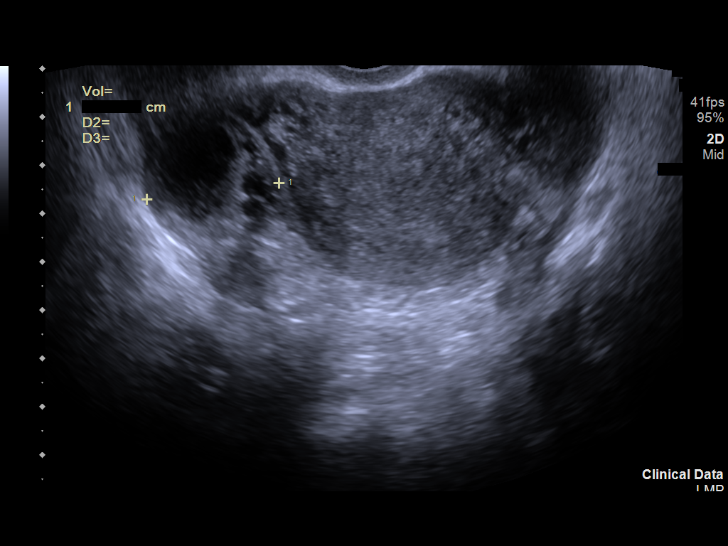
[im 52/52]
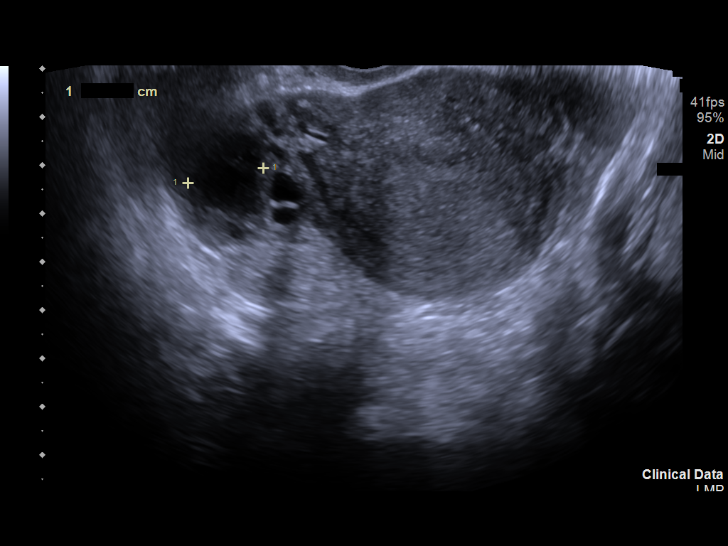

[13 of 25 positions shown; findings below may reference images not displayed]

FINDINGS: Uterus

Measurements: 8.9 x 4.6 x 4.6 cm = volume: 101.9 mL. No fibroids or
other mass visualized.

Endometrium

Thickness: 5.3 mm.  No focal abnormality visualized.

Right ovary

Measurements: 3.9 x 2.7 x 2.7 cm = volume: 15.5 mL. 2.2 cm septated
cyst is identified likely follicular cyst.

Left ovary

Measurements: 3.7 x 2 x 2 cm = volume: 8.2 mL. Small follicular
cysts are noted.

Pulsed Doppler evaluation of both ovaries demonstrates normal
low-resistance arterial and venous waveforms.

Other findings

No abnormal free fluid. Incidental finding of mild diffuse thickened
bladder wall measuring 6.8 mm in a partial decompressed bladder.
IMPRESSION: No evidence of ovarian torsion.

Incidental finding of mild diffuse thickened bladder wall measuring
6.8 mm in a partial decompressed bladder.

## 2021-01-02 ENCOUNTER — Encounter (HOSPITAL_BASED_OUTPATIENT_CLINIC_OR_DEPARTMENT_OTHER): Payer: Self-pay

## 2021-01-02 ENCOUNTER — Emergency Department (HOSPITAL_BASED_OUTPATIENT_CLINIC_OR_DEPARTMENT_OTHER)
Admission: EM | Admit: 2021-01-02 | Discharge: 2021-01-02 | Disposition: A | Discharge status: Home / Self Care | Payer: Managed Care, Other (non HMO) | Attending: Emergency Medicine | Admitting: Emergency Medicine

## 2021-01-02 ENCOUNTER — Other Ambulatory Visit: Payer: Self-pay

## 2021-01-02 DIAGNOSIS — J069 Acute upper respiratory infection, unspecified: Secondary | ICD-10-CM | POA: Diagnosis not present

## 2021-01-02 DIAGNOSIS — Z7952 Long term (current) use of systemic steroids: Secondary | ICD-10-CM | POA: Diagnosis not present

## 2021-01-02 DIAGNOSIS — Z20822 Contact with and (suspected) exposure to covid-19: Secondary | ICD-10-CM | POA: Diagnosis not present

## 2021-01-02 DIAGNOSIS — R059 Cough, unspecified: Secondary | ICD-10-CM | POA: Diagnosis present

## 2021-01-02 DIAGNOSIS — J45909 Unspecified asthma, uncomplicated: Secondary | ICD-10-CM | POA: Insufficient documentation

## 2021-01-02 LAB — RESP PANEL BY RT-PCR (FLU A&B, COVID) ARPGX2
Influenza A by PCR: NEGATIVE
Influenza B by PCR: NEGATIVE
SARS Coronavirus 2 by RT PCR: NEGATIVE

## 2021-01-02 MED ORDER — ALBUTEROL SULFATE (5 MG/ML) 0.5% IN NEBU: 2.5000 mg | INHALATION_SOLUTION | Freq: Four times a day (QID) | RESPIRATORY_TRACT | 1 refills | Status: DC | PRN

## 2021-01-02 MED ORDER — BENZONATATE 200 MG PO CAPS: 200.0000 mg | ORAL_CAPSULE | Freq: Three times a day (TID) | ORAL | 0 refills | Status: DC | PRN

## 2021-01-02 MED ORDER — ALBUTEROL SULFATE HFA 108 (90 BASE) MCG/ACT IN AERS: 2.0000 | INHALATION_SPRAY | RESPIRATORY_TRACT | 0 refills | Status: DC | PRN

## 2021-01-02 NOTE — ED Provider Notes (Signed)
[facility] EMERGENCY DEPARTMENT Provider Note   CSN: [id number] Arrival date & time: 01/02/21  1520     History Chief Complaint  Patient presents with   Cough    Kathy Flores is a 30 y.o. female.  Kathy Flores is a 30 y.o. female with history of asthma, who presents to the emergency department for evaluation of cough and congestion.  Symptoms have been present for 6 days.  Patient reports that her 92-year-old has been sick with similar symptoms.  She has had chills and sweats but has not taken her temperature.  She also reports some sore throat.  Cough is occasionally productive.  No chest pain or shortness of breath.  Cough worse at night.  No nausea, vomiting, diarrhea or abdominal pain.  She has been taking hot tea with lemon but has not tried any medications to treat her symptoms.  The history is provided by the patient and medical records.      Past Medical History:  Diagnosis Date   Asthma    inhaler used 3x month   Depression    Fibroid    Transient hypertension of pregnancy in third trimester 09/26/2019   Trichomonas infection    Vaginal Pap smear, abnormal     Patient Active Problem List   Diagnosis Date Noted   BMI 40.0-44.9, adult ([place]) 09/26/2019   Gestational hypertension 09/26/2019   Alpha thalassemia silent carrier 09/06/2019   Carrier of fragile X syndrome 09/06/2019   Primary insomnia 07/19/2019   Bipolar depression ([place]) 06/20/2019   History of cesarean section complicating pregnancy 37/10/6267   Moderate persistent asthma with exacerbation    Normochromic normocytic anemia 09/23/2015   Uterine fibroid in antepartum period 05/05/2013    Past Surgical History:  Procedure Laterality Date   CESAREAN SECTION N/A 11/23/2013   Procedure: CESAREAN SECTION;  Surgeon: Woodroe Mode, MD;  Location: [facility];  Service: Obstetrics;  Laterality: N/A;     OB History     Gravida  4   Para  4   Term  4   Preterm      AB      Living   4      SAB      IAB      Ectopic      Multiple  0   Live Births  4           Family History  Problem Relation Age of Onset   Asthma Sister    Healthy Mother    Healthy Father     Social History   Tobacco Use   Smoking status: Former    Types: Cigarettes   Smokeless tobacco: Never  Vaping Use   Vaping Use: Never used  Substance Use Topics   Alcohol use: No   Drug use: No    Home Medications Prior to Admission medications   Medication Sig Start Date End Date Taking? Authorizing Provider  benzonatate (TESSALON) 200 MG capsule Take 1 capsule (200 mg total) by mouth 3 (three) times daily as needed for cough. 01/02/21  Yes Jacqlyn Larsen, PA-C  acetaminophen (TYLENOL) 325 MG tablet Take 2 tablets (650 mg total) by mouth every 4 (four) hours as needed (for pain scale < 4). 09/30/19   Truett Mainland, DO  albuterol (VENTOLIN HFA) 108 (90 Base) MCG/ACT inhaler Inhale 2 puffs into the lungs every 4 (four) hours as needed for wheezing or shortness of breath. 05/06/20   Breck Coons,  MD  amLODipine (NORVASC) 5 MG tablet Take 1 tablet (5 mg total) by mouth daily. 9/38/10   Arrie Senate, MD  budesonide-formoterol [facility]) 160-4.5 MCG/ACT inhaler Inhale 2 puffs into the lungs in the morning and at bedtime. 07/19/19   Donnamae Jude, MD  cyclobenzaprine (FLEXERIL) 10 MG tablet Take 1 tablet (10 mg total) by mouth 2 (two) times daily as needed for muscle spasms. 08/03/19   Fair, Marin Shutter, MD  diphenhydramine-acetaminophen (TYLENOL PM) 25-500 MG TABS tablet Take 2 tablets by mouth at bedtime as needed (sleep/pain).    [provider]  Elastic Bandages & Supports (COMFORT FIT MATERNITY SUPP LG) MISC 1 Units by Does not apply route daily. 06/20/19   Lajean Manes, CNM  fluticasone (FLONASE) 50 MCG/ACT nasal spray Place 2 sprays into both nostrils daily. 05/31/19   Nita Sells, MD  naproxen (NAPROSYN) 500 MG tablet Take 1 tablet (500 mg total) by mouth 2  (two) times daily with a meal. 12/07/19   Vanessa Kick, MD  norethindrone (MICRONOR) 0.35 MG tablet Take 1 tablet (0.35 mg total) by mouth daily. 09/30/19   Truett Mainland, DO  predniSONE (DELTASONE) 20 MG tablet Take 2 tablets (40 mg total) by mouth daily. 12/07/19   Vanessa Kick, MD  Prenatal Vit-Fe Fumarate-FA (PRENATAL VITAMIN) 27-0.8 MG TABS Take 1 tablet by mouth daily. Patient not taking: Reported on 09/29/2019 05/26/19   Seabron Spates, CNM  zolpidem (AMBIEN) 5 MG tablet Take 1 tablet (5 mg total) by mouth at bedtime as needed for sleep. Patient taking differently: Take 5 mg by mouth at bedtime. 09/16/19   Luvenia Redden, PA-C    Allergies    Keflet [cephalexin] and Penicillins  Review of Systems   Review of Systems  Constitutional:  Positive for chills. Negative for fever.  HENT:  Positive for congestion, rhinorrhea and sore throat.   Respiratory:  Positive for cough. Negative for shortness of breath.   Cardiovascular:  Negative for chest pain.  Gastrointestinal:  Negative for abdominal pain, diarrhea, nausea and vomiting.  Musculoskeletal:  Positive for myalgias. Negative for neck pain and neck stiffness.  Skin:  Negative for color change and rash.  Neurological:  Positive for headaches.  All other systems reviewed and are negative.  Physical Exam Updated Vital Signs BP 131/78 (BP Location: Left Arm)    Pulse 87    Temp 98.5 F (36.9 C) (Oral)    Resp 20    Ht 5\' 3"  (1.6 m)    Wt 106.1 kg    SpO2 99%    BMI 41.45 kg/m   Physical Exam Vitals and nursing note reviewed.  Constitutional:      General: She is not in acute distress.    Appearance: Normal appearance. She is well-developed and normal weight. She is not ill-appearing or diaphoretic.  HENT:     Head: Normocephalic and atraumatic.     Nose: Congestion and rhinorrhea present.     Mouth/Throat:     Mouth: Mucous membranes are moist.     Pharynx: Oropharynx is clear. Posterior oropharyngeal erythema present. No  oropharyngeal exudate.     Comments: Posterior oropharynx clear and mucous membranes moist, there is mild erythema but no edema or tonsillar exudates, uvula midline, normal phonation, no trismus, tolerating secretions without difficulty. Eyes:     General:        Right eye: No discharge.        Left eye: No discharge.  Neck:  Comments: No rigidity Cardiovascular:     Rate and Rhythm: Normal rate and regular rhythm.     Heart sounds: Normal heart sounds. No murmur heard.   No friction rub. No gallop.  Pulmonary:     Effort: Pulmonary effort is normal. No respiratory distress.     Breath sounds: Normal breath sounds.     Comments: Respirations equal and unlabored, patient able to speak in full sentences, lungs clear to auscultation bilaterally  Abdominal:     General: Bowel sounds are normal. There is no distension.     Palpations: Abdomen is soft. There is no mass.     Tenderness: There is no abdominal tenderness. There is no guarding.     Comments: Abdomen soft, nondistended, nontender to palpation in all quadrants without guarding or peritoneal signs  Musculoskeletal:        General: No deformity.     Cervical back: Neck supple.  Lymphadenopathy:     Cervical: No cervical adenopathy.  Skin:    General: Skin is warm and dry.     Capillary Refill: Capillary refill takes less than 2 seconds.  Neurological:     Mental Status: She is alert and oriented to person, place, and time.  Psychiatric:        Mood and Affect: Mood normal.        Behavior: Behavior normal.    ED Results / Procedures / Treatments   Labs (all labs ordered are listed, but only abnormal results are displayed) Labs Reviewed  RESP PANEL BY RT-PCR (FLU A&B, COVID) [id number]  PREGNANCY, URINE    EKG None  Radiology No results found.  Procedures Procedures   Medications Ordered in ED Medications - No data to display  ED Course  I have reviewed the triage vital signs and the nursing  notes.  Pertinent labs & imaging results that were available during my care of the patient were reviewed by me and considered in my medical decision making (see chart for details).    MDM Rules/Calculators/A&P                         Pt presents with nasal congestion and cough. Pt is well appearing and vitals are normal. Lungs CTA on exam, patient without fevers, tachypnea, hypoxia or tachycardia, low suspicion for pneumonia, do not feel that chest x-ray is indicated.  Patients symptoms are consistent with URI, likely viral etiology.  Patient negative for COVID and influenza.  Discussed that antibiotics are not indicated for viral infections. Pt will be discharged with symptomatic treatment.  Verbalizes understanding and is agreeable with plan. Pt is hemodynamically stable & in NAD prior to dc. Return precautions discussed, pt expresses understanding and agrees with plan.    Final Clinical Impression(s) / ED Diagnoses Final diagnoses:  Viral URI with cough    Rx / DC Orders ED Discharge Orders          Ordered    benzonatate (TESSALON) 200 MG capsule  3 times daily PRN        01/02/21 2014             Jacqlyn Larsen, PA-C 01/02/21 2022    Lorelle Gibbs, DO 01/03/21 1530

## 2021-01-02 NOTE — ED Notes (Signed)
ED Provider at bedside. 

## 2021-01-02 NOTE — Discharge Instructions (Addendum)
Your symptoms are likely caused by a viral upper respiratory infection. Antibiotics are not helpful in treating viral infection, the virus should run its course in about 5-7 days. Please make sure you are drinking plenty of fluids. You can treat your symptoms supportively with tylenol/ibuprofen for fevers and pains, Zyrtec and Flonase to help with nasal congestion, and prescribed cough medicine and throat lozenges to help with cough. If your symptoms are not improving please follow up with you Primary doctor.   If you develop persistent fevers, shortness of breath or difficulty breathing, chest pain, severe headache and neck pain, persistent nausea and vomiting or other new or concerning symptoms return to the Emergency department.

## 2021-01-02 NOTE — ED Triage Notes (Signed)
Pt c/o flu like sx x 6 days-NAD-steady gait

## 2021-07-16 ENCOUNTER — Encounter (HOSPITAL_COMMUNITY): Payer: Self-pay | Admitting: Emergency Medicine

## 2021-07-16 ENCOUNTER — Emergency Department (HOSPITAL_COMMUNITY)
Admission: EM | Admit: 2021-07-16 | Discharge: 2021-07-16 | Discharge status: Against Medical Advice | Payer: Medicaid Other | Attending: Emergency Medicine | Admitting: Emergency Medicine

## 2021-07-16 ENCOUNTER — Emergency Department (HOSPITAL_COMMUNITY): Payer: Medicaid Other

## 2021-07-16 ENCOUNTER — Encounter (HOSPITAL_COMMUNITY): Payer: Self-pay

## 2021-07-16 ENCOUNTER — Other Ambulatory Visit: Payer: Self-pay

## 2021-07-16 ENCOUNTER — Emergency Department (HOSPITAL_COMMUNITY)
Admission: EM | Admit: 2021-07-16 | Discharge: 2021-07-16 | Disposition: A | Discharge status: Home / Self Care | Payer: Medicaid Other | Source: Home / Self Care | Attending: Emergency Medicine | Admitting: Emergency Medicine

## 2021-07-16 DIAGNOSIS — J45909 Unspecified asthma, uncomplicated: Secondary | ICD-10-CM | POA: Insufficient documentation

## 2021-07-16 DIAGNOSIS — Z7951 Long term (current) use of inhaled steroids: Secondary | ICD-10-CM | POA: Insufficient documentation

## 2021-07-16 DIAGNOSIS — J209 Acute bronchitis, unspecified: Secondary | ICD-10-CM | POA: Insufficient documentation

## 2021-07-16 DIAGNOSIS — R0602 Shortness of breath: Secondary | ICD-10-CM | POA: Insufficient documentation

## 2021-07-16 DIAGNOSIS — Z5321 Procedure and treatment not carried out due to patient leaving prior to being seen by health care provider: Secondary | ICD-10-CM | POA: Diagnosis not present

## 2021-07-16 DIAGNOSIS — J029 Acute pharyngitis, unspecified: Secondary | ICD-10-CM | POA: Insufficient documentation

## 2021-07-16 DIAGNOSIS — R059 Cough, unspecified: Secondary | ICD-10-CM | POA: Insufficient documentation

## 2021-07-16 DIAGNOSIS — R6883 Chills (without fever): Secondary | ICD-10-CM | POA: Diagnosis not present

## 2021-07-16 DIAGNOSIS — J069 Acute upper respiratory infection, unspecified: Secondary | ICD-10-CM | POA: Insufficient documentation

## 2021-07-16 DIAGNOSIS — I10 Essential (primary) hypertension: Secondary | ICD-10-CM | POA: Insufficient documentation

## 2021-07-16 DIAGNOSIS — Z79899 Other long term (current) drug therapy: Secondary | ICD-10-CM | POA: Insufficient documentation

## 2021-07-16 MED ORDER — GUAIFENESIN 200 MG PO TABS: 200.0000 mg | ORAL_TABLET | ORAL | 0 refills | Status: DC | PRN

## 2021-07-16 MED ORDER — ALBUTEROL SULFATE HFA 108 (90 BASE) MCG/ACT IN AERS: 2.0000 | INHALATION_SPRAY | RESPIRATORY_TRACT | Status: DC | PRN

## 2021-07-16 MED ORDER — PREDNISONE 20 MG PO TABS: ORAL_TABLET | ORAL | 0 refills | Status: DC

## 2021-07-16 NOTE — Discharge Instructions (Addendum)
You have been diagnosed with bronchitis.  Please use albuterol inhaler 2 puffs every 4 hours as needed for shortness of breath.  Take prednisone to help aid with your symptoms.  You may use guaifenesin as needed for congestion and for cough.

## 2021-07-16 NOTE — ED Provider Notes (Signed)
[facility] DEPT Provider Note   CSN: [id number] Arrival date & time: 07/16/21  1516     History  Chief Complaint  Patient presents with   Nasal Congestion   Asthma    Kathy Flores is a 31 y.o. female.  The history is provided by the patient and medical records. No language interpreter was used.  Asthma    31 year old female significant history of asthma, bipolar, hypertension, presenting complaining of shortness of breath.  Patient reports since yesterday she has had progressive worsening shortness of breath, congestion, as well as wheezing.  She is using and inhaler at least 5 times throughout the day yesterday without adequate improvement.  She does not endorse any fever or productive cough.  She was recently exposed to her aunt who is sick but denies direct contact.  She initially went to [facility] this morning for evaluation but left after prolonged wait.  She denies any prior history of PE or DVT, she is currently not on any oral hormone pill.  No report of any nausea vomiting diarrhea  Home Medications Prior to Admission medications   Medication Sig Start Date End Date Taking? Authorizing Provider  acetaminophen (TYLENOL) 325 MG tablet Take 2 tablets (650 mg total) by mouth every 4 (four) hours as needed (for pain scale < 4). 09/30/19   Truett Mainland, DO  albuterol (PROVENTIL) (5 MG/ML) 0.5% nebulizer solution Take 0.5 mLs (2.5 mg total) by nebulization every 6 (six) hours as needed for wheezing or shortness of breath. 01/02/21   Jacqlyn Larsen, PA-C  albuterol (VENTOLIN HFA) 108 (90 Base) MCG/ACT inhaler Inhale 2 puffs into the lungs every 4 (four) hours as needed for wheezing or shortness of breath. 01/02/21   Jacqlyn Larsen, PA-C  amLODipine (NORVASC) 5 MG tablet Take 1 tablet (5 mg total) by mouth daily. 0/81/44   Arrie Senate, MD  benzonatate (TESSALON) 200 MG capsule Take 1 capsule (200 mg total) by mouth 3 (three)  times daily as needed for cough. 01/02/21   Jacqlyn Larsen, PA-C  budesonide-formoterol [facility]) 160-4.5 MCG/ACT inhaler Inhale 2 puffs into the lungs in the morning and at bedtime. 07/19/19   Donnamae Jude, MD  cyclobenzaprine (FLEXERIL) 10 MG tablet Take 1 tablet (10 mg total) by mouth 2 (two) times daily as needed for muscle spasms. 08/03/19   Fair, Marin Shutter, MD  diphenhydramine-acetaminophen (TYLENOL PM) 25-500 MG TABS tablet Take 2 tablets by mouth at bedtime as needed (sleep/pain).    [provider]  Elastic Bandages & Supports (COMFORT FIT MATERNITY SUPP LG) MISC 1 Units by Does not apply route daily. 06/20/19   Lajean Manes, CNM  fluticasone (FLONASE) 50 MCG/ACT nasal spray Place 2 sprays into both nostrils daily. 05/31/19   Nita Sells, MD  naproxen (NAPROSYN) 500 MG tablet Take 1 tablet (500 mg total) by mouth 2 (two) times daily with a meal. 12/07/19   Vanessa Kick, MD  norethindrone (MICRONOR) 0.35 MG tablet Take 1 tablet (0.35 mg total) by mouth daily. 09/30/19   Truett Mainland, DO  predniSONE (DELTASONE) 20 MG tablet Take 2 tablets (40 mg total) by mouth daily. 12/07/19   Vanessa Kick, MD  Prenatal Vit-Fe Fumarate-FA (PRENATAL VITAMIN) 27-0.8 MG TABS Take 1 tablet by mouth daily. Patient not taking: Reported on 09/29/2019 05/26/19   Seabron Spates, CNM  zolpidem (AMBIEN) 5 MG tablet Take 1 tablet (5 mg total) by mouth at bedtime as needed for  sleep. Patient taking differently: Take 5 mg by mouth at bedtime. 09/16/19   Luvenia Redden, PA-C      Allergies    Keflet [cephalexin] and Penicillins    Review of Systems   Review of Systems  All other systems reviewed and are negative.   Physical Exam Updated Vital Signs BP (!) 142/84 (BP Location: Right Arm)   Pulse (!) 102   Temp 99.4 F (37.4 C) (Oral)   Resp 20   Ht '5\' 3"'$  (1.6 m)   Wt 102.1 kg   LMP 06/17/2021 (Approximate)   SpO2 96%   BMI 39.86 kg/m  Physical Exam Vitals and nursing note  reviewed.  Constitutional:      General: She is not in acute distress.    Appearance: She is well-developed.  HENT:     Head: Atraumatic.  Eyes:     Conjunctiva/sclera: Conjunctivae normal.  Cardiovascular:     Rate and Rhythm: Tachycardia present.  Pulmonary:     Effort: Pulmonary effort is normal.     Breath sounds: Rhonchi present. No wheezing or rales.  Abdominal:     Palpations: Abdomen is soft.  Musculoskeletal:     Cervical back: Neck supple.  Skin:    Findings: No rash.  Neurological:     Mental Status: She is alert.  Psychiatric:        Mood and Affect: Mood normal.     ED Results / Procedures / Treatments   Labs (all labs ordered are listed, but only abnormal results are displayed) Labs Reviewed  BASIC METABOLIC PANEL    EKG None  Radiology DG Chest 2 View  Result Date: 07/16/2021 CLINICAL DATA:  Shortness of breath EXAM: CHEST - 2 VIEW COMPARISON:  05/06/2020 FINDINGS: Airway thickening is present, suggesting bronchitis or reactive airways disease. No airspace opacity. The lungs appear otherwise clear. No blunting of the costophrenic angles. Minimal thoracic spondylosis. Cardiac and mediastinal margins appear normal. IMPRESSION: 1. Mild airway thickening is present, suggesting bronchitis or reactive airways disease. Electronically Signed   By: Van Clines M.D.   On: 07/16/2021 16:15    Procedures Procedures    Medications Ordered in ED Medications  albuterol (VENTOLIN HFA) 108 (90 Base) MCG/ACT inhaler 2 puff (has no administration in time range)    ED Course/ Medical Decision Making/ A&P                           Medical Decision Making Amount and/or Complexity of Data Reviewed Labs: ordered. Radiology: ordered.   BP (!) 142/84 (BP Location: Right Arm)   Pulse (!) 102   Temp 99.4 F (37.4 C) (Oral)   Resp 20   Ht '5\' 3"'$  (1.6 m)   Wt 102.1 kg   LMP 06/17/2021 (Approximate)   SpO2 96%   BMI 39.86 kg/m   3:59 PM This is a  31 year old female significant history of asthma presenting complaints of nasal congestion, increased shortness of breath and wheezing since yesterday.  She reports she has been using her rescue inhaler 5 times yesterday without adequate improvement.  On exam, she does have minimal expiratory and inspiratory wheezes suggestive of an asthma exacerbation, but mostly rhonchi. Other she is mildly tachycardic with a heart rate of 102, she has low risk for PE based on Wells criteria.  She was recently seen at [facility], ER but left prior to being seen.  A viral respiratory panel obtained there but have not resulted  yet.  Will obtain chest x-ray to screen for potential pneumonia.  Will request respiratory therapist to provide breathing treatment per wheeze protocol.  4:48 PM CXR independently viewed and interpreted by me and are suggestive of reactive airway disease such as bronchitis.  No focal infiltrates to suggest pna. Doubt ptx, ACS, or dissection.    Will provide rescue inhaler, discharge home with prednisone taper course and decongestant.  Return precaution given.         Final Clinical Impression(s) / ED Diagnoses Final diagnoses:  Acute bronchitis, unspecified organism    Rx / DC Orders ED Discharge Orders          Ordered    predniSONE (DELTASONE) 20 MG tablet        07/16/21 1652    guaiFENesin 200 MG tablet  Every 4 hours PRN        07/16/21 1652              Domenic Moras, PA-C 07/16/21 1654    Daleen Bo, MD 07/17/21 [phone]

## 2021-07-16 NOTE — ED Provider Triage Note (Signed)
Emergency Medicine Provider Triage Evaluation Note  MARTINA BRODBECK , a 31 y.o. female  was evaluated in triage.  Pt complains of sob. Report congestion, wheezing, sob since yesterday.  No fever.   Review of Systems  Positive: As above Negative: As above  Physical Exam  BP (!) 142/84 (BP Location: Right Arm)   Pulse (!) 102   Temp 99.4 F (37.4 C) (Oral)   Resp 20   Ht '5\' 3"'$  (1.6 m)   Wt 102.1 kg   SpO2 96%   BMI 39.86 kg/m  Gen:   Awake, no distress   Resp:  Normal effort  MSK:   Moves extremities without difficulty  Other:    Medical Decision Making  Medically screening exam initiated at 3:50 PM.  Appropriate orders placed.  MAKAIYA GEERDES was informed that the remainder of the evaluation will be completed by another provider, this initial triage assessment does not replace that evaluation, and the importance of remaining in the ED until their evaluation is complete.     Domenic Moras, PA-C 07/16/21 1552

## 2021-07-16 NOTE — ED Triage Notes (Signed)
Patient with history of asthma here with complaint of shortness of breath that started yesterday, reports recent upper respiratory infection. Patient states she has been using her inhaler since yesterday but with no relief.

## 2021-07-16 NOTE — ED Triage Notes (Signed)
Patient c/o SOB and reports a history of asthma. Patient also c/o nasal congestion. Patient states she has been using her albuterol inhaler with no relief.

## 2021-07-16 NOTE — ED Provider Triage Note (Signed)
Emergency Medicine Provider Triage Evaluation Note  Kathy Flores , a 31 y.o. female  was evaluated in triage.  Pt complains of shortness of breath. Hx of asthma, flares when she gets upper respiratory infections. Started having sore throat, cough with some chills yesterday and SOB progressed. Has been trying albuterol inhaler without relief, last used 30 min ago.   Review of Systems  Positive: As above Negative: CP  Physical Exam  BP 133/90 (BP Location: Left Arm)   Pulse (!) 106   Temp 98.9 F (37.2 C) (Oral)   Resp 18   SpO2 97%  Gen:   Awake, no distress   Resp:  Normal effort  MSK:   Moves extremities without difficulty  Other:  Audible wheezing  Medical Decision Making  Medically screening exam initiated at 10:53 AM.  Appropriate orders placed.  MOYINOLUWA Flores was informed that the remainder of the evaluation will be completed by another provider, this initial triage assessment does not replace that evaluation, and the importance of remaining in the ED until their evaluation is complete.     Jayvion Stefanski T, PA-C 07/16/21 1056

## 2021-11-01 ENCOUNTER — Emergency Department (HOSPITAL_BASED_OUTPATIENT_CLINIC_OR_DEPARTMENT_OTHER)
Admission: EM | Admit: 2021-11-01 | Discharge: 2021-11-01 | Discharge status: Against Medical Advice | Payer: Medicaid Other | Attending: Emergency Medicine | Admitting: Emergency Medicine

## 2021-11-01 ENCOUNTER — Encounter (HOSPITAL_BASED_OUTPATIENT_CLINIC_OR_DEPARTMENT_OTHER): Payer: Self-pay | Admitting: *Deleted

## 2021-11-01 ENCOUNTER — Emergency Department (HOSPITAL_BASED_OUTPATIENT_CLINIC_OR_DEPARTMENT_OTHER): Payer: Medicaid Other

## 2021-11-01 ENCOUNTER — Other Ambulatory Visit: Payer: Self-pay

## 2021-11-01 DIAGNOSIS — Z20822 Contact with and (suspected) exposure to covid-19: Secondary | ICD-10-CM | POA: Diagnosis not present

## 2021-11-01 DIAGNOSIS — Z5321 Procedure and treatment not carried out due to patient leaving prior to being seen by health care provider: Secondary | ICD-10-CM | POA: Insufficient documentation

## 2021-11-01 DIAGNOSIS — R059 Cough, unspecified: Secondary | ICD-10-CM | POA: Diagnosis present

## 2021-11-01 LAB — RESP PANEL BY RT-PCR (FLU A&B, COVID) ARPGX2
Influenza A by PCR: NEGATIVE
Influenza B by PCR: NEGATIVE
SARS Coronavirus 2 by RT PCR: NEGATIVE

## 2021-11-01 NOTE — ED Triage Notes (Signed)
Presents with congested cough, having prod cough of thick yellow sputum, denies any fevers, denies any body aches. Denies nausea , vomiting or diarrhea. States out of medication at home for asthma

## 2021-11-05 ENCOUNTER — Other Ambulatory Visit (HOSPITAL_BASED_OUTPATIENT_CLINIC_OR_DEPARTMENT_OTHER): Payer: Self-pay

## 2021-11-05 ENCOUNTER — Emergency Department (HOSPITAL_BASED_OUTPATIENT_CLINIC_OR_DEPARTMENT_OTHER): Payer: Medicaid Other

## 2021-11-05 ENCOUNTER — Emergency Department (HOSPITAL_BASED_OUTPATIENT_CLINIC_OR_DEPARTMENT_OTHER)
Admission: EM | Admit: 2021-11-05 | Discharge: 2021-11-05 | Disposition: A | Discharge status: Home / Self Care | Payer: Medicaid Other | Attending: Emergency Medicine | Admitting: Emergency Medicine

## 2021-11-05 ENCOUNTER — Encounter (HOSPITAL_BASED_OUTPATIENT_CLINIC_OR_DEPARTMENT_OTHER): Payer: Self-pay | Admitting: Emergency Medicine

## 2021-11-05 DIAGNOSIS — J180 Bronchopneumonia, unspecified organism: Secondary | ICD-10-CM | POA: Diagnosis not present

## 2021-11-05 DIAGNOSIS — Z7951 Long term (current) use of inhaled steroids: Secondary | ICD-10-CM | POA: Diagnosis not present

## 2021-11-05 DIAGNOSIS — R059 Cough, unspecified: Secondary | ICD-10-CM | POA: Diagnosis present

## 2021-11-05 DIAGNOSIS — J45909 Unspecified asthma, uncomplicated: Secondary | ICD-10-CM | POA: Diagnosis not present

## 2021-11-05 MED ORDER — ALBUTEROL SULFATE HFA 108 (90 BASE) MCG/ACT IN AERS
2.0000 | INHALATION_SPRAY | RESPIRATORY_TRACT | Status: DC | PRN
  Administered 2021-11-05: 2 via RESPIRATORY_TRACT
  Filled 2021-11-05: qty 6.7

## 2021-11-05 MED ORDER — ALBUTEROL SULFATE (2.5 MG/3ML) 0.083% IN NEBU
2.5000 mg | INHALATION_SOLUTION | Freq: Four times a day (QID) | RESPIRATORY_TRACT | 2 refills | Status: DC | PRN
  Filled 2021-11-05: qty 75, 7d supply, fill #0

## 2021-11-05 MED ORDER — ALBUTEROL SULFATE (2.5 MG/3ML) 0.083% IN NEBU
5.0000 mg | INHALATION_SOLUTION | Freq: Once | RESPIRATORY_TRACT | Status: AC
  Administered 2021-11-05: 5 mg via RESPIRATORY_TRACT
  Filled 2021-11-05: qty 6

## 2021-11-05 MED ORDER — IPRATROPIUM BROMIDE 0.02 % IN SOLN
0.5000 mg | Freq: Once | RESPIRATORY_TRACT | Status: AC
Start: 2021-11-05 — End: 2021-11-05
  Administered 2021-11-05: 0.5 mg via RESPIRATORY_TRACT
  Filled 2021-11-05: qty 2.5

## 2021-11-05 MED ORDER — DOXYCYCLINE HYCLATE 100 MG PO CAPS
100.0000 mg | ORAL_CAPSULE | Freq: Two times a day (BID) | ORAL | 0 refills | Status: DC
  Filled 2021-11-05: qty 10, 5d supply, fill #0

## 2021-11-05 MED ORDER — PREDNISONE 20 MG PO TABS
40.0000 mg | ORAL_TABLET | Freq: Every day | ORAL | 0 refills | Status: DC
  Filled 2021-11-05: qty 10, 5d supply, fill #0

## 2021-11-05 MED ORDER — ALBUTEROL SULFATE (5 MG/ML) 0.5% IN NEBU
2.5000 mg | INHALATION_SOLUTION | Freq: Four times a day (QID) | RESPIRATORY_TRACT | 1 refills | Status: DC | PRN
  Filled 2021-11-05: qty 20, 10d supply, fill #0

## 2021-11-05 MED ORDER — IPRATROPIUM BROMIDE 0.02 % IN SOLN
0.5000 mg | Freq: Once | RESPIRATORY_TRACT | Status: AC
  Administered 2021-11-05: 0.5 mg via RESPIRATORY_TRACT
  Filled 2021-11-05: qty 2.5

## 2021-11-05 MED ORDER — PREDNISONE 50 MG PO TABS
60.0000 mg | ORAL_TABLET | Freq: Once | ORAL | Status: AC
  Administered 2021-11-05: 60 mg via ORAL
  Filled 2021-11-05: qty 1

## 2021-11-05 NOTE — ED Triage Notes (Signed)
URI Sx since last week worse today. Was here couple days ago but left due to wait.

## 2021-11-05 NOTE — ED Provider Notes (Signed)
[place] EMER[facility]ENC[facility] DEPARTMENT Provider Note   CSN: [id number] Arrival date & time: 11/05/21  1779     History  Chief Complaint  Patient presents with   URI    Kathy Flores is a 31 y.o. female.  Patient is a 31 year old female with a history of asthma who is presenting today with URI symptoms that have now been present for approximately 7 days.  She reports that started with cough, congestion, low-grade fever, sore throat.  She reports unfortunately her symptoms have only seem to be getting worse in the last 3 days.  In the last 3 nights she has had excessive coughing at night and feeling short of breath.  During the day she is also having some shortness of breath and has noticed wheezing.  She reports her temperature has only been as high as 99.  She is not having any vomiting or diarrhea.  She is using her rescue inhaler at home but it is not helping much.  She is out of her nebulizer medication.  She reports this all started after her daughter came home with URI symptoms.  The history is provided by the patient.  URI Presenting symptoms: congestion, cough, rhinorrhea and sore throat        Home Medications Prior to Admission medications   Medication Sig Start Date End Date Taking? Authorizing Provider  doxycycline (VIBRAMYCIN) 100 MG capsule Take 1 capsule (100 mg total) by mouth 2 (two) times daily. 11/05/21  Yes Dedee Liss, Loree Fee, MD  predniSONE (DELTASONE) 20 MG tablet Take 2 tablets (40 mg total) by mouth daily. 11/05/21  Yes Blanchie Dessert, MD  acetaminophen (TYLENOL) 325 MG tablet Take 2 tablets (650 mg total) by mouth every 4 (four) hours as needed (for pain scale < 4). 09/30/19   Truett Mainland, DO  albuterol (PROVENTIL) (5 MG/ML) 0.5% nebulizer solution Take 0.5 mLs (2.5 mg total) by nebulization every 6 (six) hours as needed for wheezing or shortness of breath. 11/05/21   Blanchie Dessert, MD  albuterol (VENTOLIN HFA) 108 (90 Base) MCG/ACT inhaler  Inhale 2 puffs into the lungs every 4 (four) hours as needed for wheezing or shortness of breath. 01/02/21   Jacqlyn Larsen, PA-C  amLODipine (NORVASC) 5 MG tablet Take 1 tablet (5 mg total) by mouth daily. 3/90/30   Arrie Senate, MD  benzonatate (TESSALON) 200 MG capsule Take 1 capsule (200 mg total) by mouth 3 (three) times daily as needed for cough. 01/02/21   Jacqlyn Larsen, PA-C  budesonide-formoterol [facility]) 160-4.5 MCG/ACT inhaler Inhale 2 puffs into the lungs in the morning and at bedtime. 07/19/19   Donnamae Jude, MD  cyclobenzaprine (FLEXERIL) 10 MG tablet Take 1 tablet (10 mg total) by mouth 2 (two) times daily as needed for muscle spasms. 08/03/19   Fair, Marin Shutter, MD  diphenhydramine-acetaminophen (TYLENOL PM) 25-500 MG TABS tablet Take 2 tablets by mouth at bedtime as needed (sleep/pain).    [provider]  Elastic Bandages & Supports (COMFORT FIT MATERNITY SUPP LG) MISC 1 Units by Does not apply route daily. 06/20/19   Lajean Manes, CNM  fluticasone (FLONASE) 50 MCG/ACT nasal spray Place 2 sprays into both nostrils daily. 05/31/19   Nita Sells, MD  guaiFENesin 200 MG tablet Take 1 tablet (200 mg total) by mouth every 4 (four) hours as needed for cough or to loosen phlegm. 07/16/21   Domenic Moras, PA-C  naproxen (NAPROSYN) 500 MG tablet Take 1 tablet (500 mg total)  by mouth 2 (two) times daily with a meal. 12/07/19   Vanessa Kick, MD  norethindrone (MICRONOR) 0.35 MG tablet Take 1 tablet (0.35 mg total) by mouth daily. 09/30/19   Truett Mainland, DO  Prenatal Vit-Fe Fumarate-FA (PRENATAL VITAMIN) 27-0.8 MG TABS Take 1 tablet by mouth daily. Patient not taking: Reported on 09/29/2019 05/26/19   Seabron Spates, CNM  zolpidem (AMBIEN) 5 MG tablet Take 1 tablet (5 mg total) by mouth at bedtime as needed for sleep. Patient taking differently: Take 5 mg by mouth at bedtime. 09/16/19   Luvenia Redden, PA-C      Allergies    Keflet [cephalexin] and  Penicillins    Review of Systems   Review of Systems  HENT:  Positive for congestion, rhinorrhea and sore throat.   Respiratory:  Positive for cough.     Physical Exam Updated Vital Signs BP 128/76 (BP Location: Right Arm)   Pulse 97   Temp 99.3 F (37.4 C) (Oral)   Resp 16   Ht '5\' 2"'$  (1.575 m)   Wt 102 kg   LMP 10/21/2021   SpO2 98%   BMI 41.13 kg/m  Physical Exam Vitals and nursing note reviewed.  Constitutional:      General: She is not in acute distress.    Appearance: She is well-developed.  HENT:     Head: Normocephalic and atraumatic.     Right Ear: Tympanic membrane normal.     Left Ear: Tympanic membrane normal.     Nose: Congestion and rhinorrhea present.     Mouth/Throat:     Mouth: Mucous membranes are moist.     Pharynx: Posterior oropharyngeal erythema present. No oropharyngeal exudate.  Eyes:     Pupils: Pupils are equal, round, and reactive to light.  Cardiovascular:     Rate and Rhythm: Normal rate and regular rhythm.     Heart sounds: Normal heart sounds. No murmur heard.    No friction rub.  Pulmonary:     Effort: Pulmonary effort is normal. Tachypnea present.     Breath sounds: Wheezing present. No rales.  Abdominal:     General: Bowel sounds are normal. There is no distension.     Palpations: Abdomen is soft.     Tenderness: There is no abdominal tenderness. There is no guarding or rebound.  Musculoskeletal:        General: No tenderness. Normal range of motion.     Comments: No edema  Skin:    General: Skin is warm and dry.     Findings: No rash.  Neurological:     Mental Status: She is alert and oriented to person, place, and time.     Cranial Nerves: No cranial nerve deficit.  Psychiatric:        Behavior: Behavior normal.     ED Results / Procedures / Treatments   Labs (all labs ordered are listed, but only abnormal results are displayed) Labs Reviewed - No data to display  EKG None  Radiology DG Chest [facility] 1  View  Result Date: 11/05/2021 CLINICAL DATA:  Cough and shortness of breath for over a week EXAM: PORTABLE CHEST 1 VIEW COMPARISON:  11/01/2021 FINDINGS: Indistinct opacity at the right base, progressed. No edema, effusion, or pneumothorax. Normal heart size. IMPRESSION: Bronchopneumonia at the right base. Electronically Signed   By: Jorje Guild M.D.   On: 11/05/2021 07:33    Procedures Procedures    Medications Ordered in ED Medications  albuterol (VENTOLIN  HFA) 108 (90 Base) MCG/ACT inhaler 2 puff (has no administration in time range)  albuterol (PROVENTIL) (2.5 MG/3ML) 0.083% nebulizer solution 5 mg (5 mg Nebulization Given 11/05/21 0727)  ipratropium (ATROVENT) nebulizer solution 0.5 mg (0.5 mg Nebulization Given 11/05/21 0727)  predniSONE (DELTASONE) tablet 60 mg (60 mg Oral Given 11/05/21 0734)  albuterol (PROVENTIL) (2.5 MG/3ML) 0.083% nebulizer solution 5 mg (5 mg Nebulization Given 11/05/21 0836)  ipratropium (ATROVENT) nebulizer solution 0.5 mg (0.5 mg Nebulization Given 11/05/21 0836)    ED Course/ Medical Decision Making/ A&P                           Medical Decision Making Amount and/or Complexity of Data Reviewed Radiology: ordered and independent interpretation performed. Decision-making details documented in ED Course.  Risk Prescription drug management.  Pt presenting today with a complaint that caries a high risk for morbidity and mortality. Pt with symptoms consistent with viral URI/bronchitis.  Doubt COVID.  Well appearing here but mild SOB with speaking and faint wheezing on exam.  No signs of pharyngitis, otitis or abnormal abdominal findings.   Pt given prednisone, albuterol and atrovent.  Will re-access.  I have independently visualized and interpreted pt's images today. CXR with possible developing PNA in the RLL.  After treatment O2 sats still <90% but now wheezing more diffusely.  Will need a second treatment.  Also will treat with doxycycline for early  PNA.  Pt has allergy to PCN and keflex. pt to return with any further problems.  9:20 AM After 2 breathing treatments patient's wheezing is improved.  She still has some residual wheezing on the right.  Patient is stable for discharge at this time.  She was given return precautions.        Final Clinical Impression(s) / ED Diagnoses Final diagnoses:  Bronchopneumonia    Rx / DC Orders ED Discharge Orders          Ordered    albuterol (PROVENTIL) (5 MG/ML) 0.5% nebulizer solution  Every 6 hours PRN        11/05/21 0920    predniSONE (DELTASONE) 20 MG tablet  Daily        11/05/21 0920    doxycycline (VIBRAMYCIN) 100 MG capsule  2 times daily        11/05/21 0920              Blanchie Dessert, MD 11/05/21 [phone]

## 2021-11-05 NOTE — Discharge Instructions (Signed)
It looks like you are getting a bit of pneumonia on the right side and you were started on an antibiotic.  Continue to use your inhaler or nebulizer every 4-6 hours as needed for wheezing and shortness of breath.  Start the steroid tomorrow because you have already had a dose here.  If your shortness of breath becomes a lot worse, you are not improving or you start running very high fevers and vomiting return to the emergency room.

## 2022-02-17 ENCOUNTER — Emergency Department (HOSPITAL_BASED_OUTPATIENT_CLINIC_OR_DEPARTMENT_OTHER): Payer: 59

## 2022-02-17 ENCOUNTER — Other Ambulatory Visit: Payer: Self-pay

## 2022-02-17 ENCOUNTER — Emergency Department (HOSPITAL_BASED_OUTPATIENT_CLINIC_OR_DEPARTMENT_OTHER)
Admission: EM | Admit: 2022-02-17 | Discharge: 2022-02-17 | Disposition: A | Discharge status: Home / Self Care | Payer: 59 | Attending: Emergency Medicine | Admitting: Emergency Medicine

## 2022-02-17 DIAGNOSIS — J45909 Unspecified asthma, uncomplicated: Secondary | ICD-10-CM | POA: Insufficient documentation

## 2022-02-17 DIAGNOSIS — M7061 Trochanteric bursitis, right hip: Secondary | ICD-10-CM | POA: Diagnosis not present

## 2022-02-17 DIAGNOSIS — Y939 Activity, unspecified: Secondary | ICD-10-CM | POA: Diagnosis not present

## 2022-02-17 DIAGNOSIS — I10 Essential (primary) hypertension: Secondary | ICD-10-CM | POA: Insufficient documentation

## 2022-02-17 DIAGNOSIS — M7631 Iliotibial band syndrome, right leg: Secondary | ICD-10-CM | POA: Insufficient documentation

## 2022-02-17 DIAGNOSIS — M25551 Pain in right hip: Secondary | ICD-10-CM | POA: Diagnosis present

## 2022-02-17 LAB — PREGNANCY, URINE: Preg Test, Ur: NEGATIVE

## 2022-02-17 NOTE — Discharge Instructions (Addendum)
Please use Tylenol or ibuprofen for pain.  You may use 600 mg ibuprofen every 6 hours or 1000 mg of Tylenol every 6 hours.  You may choose to alternate between the 2.  This would be most effective.  Not to exceed 4 g of Tylenol within 24 hours.  Not to exceed 3200 mg ibuprofen 24 hours.  you can follow-up with the orthopedic physician whose contact information I provided above

## 2022-02-17 NOTE — ED Triage Notes (Addendum)
Pt arrives with c/o right hip pain that radiates down into right leg. Per pt, pain is constant pain that hurts with walking and sitting. Pt denies urian ry or stool incontinence.

## 2022-02-17 NOTE — ED Provider Notes (Signed)
[facility] Provider Note   CSN: [id number] Arrival date & time: 02/17/22  1720     History  Chief Complaint  Patient presents with   Leg Pain   Hip Pain    Kathy Flores is a 32 y.o. female with past medical history significant for asthma, depression who presents with concern for right hip pain that radiates down the right leg towards the knee.  Patient reports that the pain is constant starting today, hurts with walking, sitting.  She denies any urinary, stool incontinence, she denies any back pain.  She reports that she has been intermittently having some soreness on the side for around 2 months, but worsened today.  Patient reports that she has repetitive use of the right foot with sewing, driving, she has not tried anything for pain, she has not followed up with orthopedics.  She denies any numbness, tingling.   Leg Pain Hip Pain       Home Medications Prior to Admission medications   Medication Sig Start Date End Date Taking? Authorizing Provider  acetaminophen (TYLENOL) 325 MG tablet Take 2 tablets (650 mg total) by mouth every 4 (four) hours as needed (for pain scale < 4). 09/30/19   Truett Mainland, DO  albuterol (PROVENTIL) (2.5 MG/3ML) 0.083% nebulizer solution Take 3 mLs (2.5 mg total) by nebulization every 6 (six) hours as needed for wheezing or shortness of breath. 11/05/21   Blanchie Dessert, MD  albuterol (PROVENTIL) (5 MG/ML) 0.5% nebulizer solution Take 0.5 mLs (2.5 mg total) by nebulization every 6 (six) hours as needed for wheezing or shortness of breath. 11/05/21   Blanchie Dessert, MD  albuterol (VENTOLIN HFA) 108 (90 Base) MCG/ACT inhaler Inhale 2 puffs into the lungs every 4 (four) hours as needed for wheezing or shortness of breath. 01/02/21   Jacqlyn Larsen, PA-C  amLODipine (NORVASC) 5 MG tablet Take 1 tablet (5 mg total) by mouth daily. 0000000   Arrie Senate, MD  benzonatate (TESSALON) 200 MG  capsule Take 1 capsule (200 mg total) by mouth 3 (three) times daily as needed for cough. 01/02/21   Jacqlyn Larsen, PA-C  budesonide-formoterol [facility]) 160-4.5 MCG/ACT inhaler Inhale 2 puffs into the lungs in the morning and at bedtime. 07/19/19   Donnamae Jude, MD  cyclobenzaprine (FLEXERIL) 10 MG tablet Take 1 tablet (10 mg total) by mouth 2 (two) times daily as needed for muscle spasms. 08/03/19   Fair, Marin Shutter, MD  diphenhydramine-acetaminophen (TYLENOL PM) 25-500 MG TABS tablet Take 2 tablets by mouth at bedtime as needed (sleep/pain).    [provider]  doxycycline (VIBRAMYCIN) 100 MG capsule Take 1 capsule (100 mg total) by mouth 2 (two) times daily. 11/05/21   Blanchie Dessert, MD  Elastic Bandages & Supports (COMFORT FIT MATERNITY SUPP LG) MISC 1 Units by Does not apply route daily. 06/20/19   Lajean Manes, CNM  fluticasone (FLONASE) 50 MCG/ACT nasal spray Place 2 sprays into both nostrils daily. 05/31/19   Nita Sells, MD  guaiFENesin 200 MG tablet Take 1 tablet (200 mg total) by mouth every 4 (four) hours as needed for cough or to loosen phlegm. 07/16/21   Domenic Moras, PA-C  naproxen (NAPROSYN) 500 MG tablet Take 1 tablet (500 mg total) by mouth 2 (two) times daily with a meal. 12/07/19   Vanessa Kick, MD  norethindrone (MICRONOR) 0.35 MG tablet Take 1 tablet (0.35 mg total) by mouth daily. 09/30/19   Stinson,  Tanna Savoy, DO  predniSONE (DELTASONE) 20 MG tablet Take 2 tablets (40 mg total) by mouth daily. 11/05/21   Blanchie Dessert, MD  Prenatal Vit-Fe Fumarate-FA (PRENATAL VITAMIN) 27-0.8 MG TABS Take 1 tablet by mouth daily. Patient not taking: Reported on 09/29/2019 05/26/19   Seabron Spates, CNM  zolpidem (AMBIEN) 5 MG tablet Take 1 tablet (5 mg total) by mouth at bedtime as needed for sleep. Patient taking differently: Take 5 mg by mouth at bedtime. 09/16/19   Luvenia Redden, PA-C      Allergies    Keflet [cephalexin] and Penicillins    Review of  Systems   Review of Systems  All other systems reviewed and are negative.   Physical Exam Updated Vital Signs BP 119/89 (BP Location: Left Arm)   Pulse 77   Temp 98.3 F (36.8 C) (Oral)   Resp 17   Wt 103.4 kg   SpO2 100%   BMI 41.70 kg/m  Physical Exam Vitals and nursing note reviewed.  Constitutional:      General: She is not in acute distress.    Appearance: Normal appearance.  HENT:     Head: Normocephalic and atraumatic.  Eyes:     General:        Right eye: No discharge.        Left eye: No discharge.  Cardiovascular:     Rate and Rhythm: Normal rate and regular rhythm.     Pulses: Normal pulses.     Heart sounds: No murmur heard.    No friction rub. No gallop.  Pulmonary:     Effort: Pulmonary effort is normal.     Breath sounds: Normal breath sounds.  Abdominal:     General: Bowel sounds are normal.     Palpations: Abdomen is soft.  Musculoskeletal:     Comments: Tenderness of right greater trochanter, IT band distribution.  No lower back tenderness to palpation, in the thoracic, lumbar spine.  No paraspinous muscle tenderness.  He is intact from 5/5 bilateral lower extremities to flexion, extension at the hip but some pain with abduction, and decreased active range of motion to flexion, but normal passive range of motion, and normal strength with some effort.  Skin:    General: Skin is warm and dry.     Capillary Refill: Capillary refill takes less than 2 seconds.  Neurological:     Mental Status: She is alert and oriented to person, place, and time.  Psychiatric:        Mood and Affect: Mood normal.        Behavior: Behavior normal.     ED Results / Procedures / Treatments   Labs (all labs ordered are listed, but only abnormal results are displayed) Labs Reviewed  PREGNANCY, URINE    EKG None  Radiology DG Hip Unilat W or Wo Pelvis 2-3 Views Right  Result Date: 02/17/2022 CLINICAL DATA:  Hip pain EXAM: DG HIP (WITH OR WITHOUT PELVIS) 2-3V  RIGHT COMPARISON:  None Available. FINDINGS: There is no evidence of hip fracture or dislocation. There is no evidence of arthropathy or other focal bone abnormality. IMPRESSION: Negative. Electronically Signed   By: Ronney Asters M.D.   On: 02/17/2022 19:30    Procedures Procedures    Medications Ordered in ED Medications - No data to display  ED Course/ Medical Decision Making/ A&P  Medical Decision Making Amount and/or Complexity of Data Reviewed Labs: ordered. Radiology: ordered.   This patient is a 32 y.o. female who presents to the ED for concern of right hip pain in acutely for 2 months, worse today.   Differential diagnoses prior to evaluation: Acute fracture, dislocation, trochanteric bursitis, clinically otitis media.  IT band syndrome, versus other.  Tenderness, muscular injury, overall low clinical suspicion for acute lumbar spine injury, very low clinical suspicion for kidney stone, or other abdominal pathology radiating to the hip.  Low clinical suspicion for avascular necrosis this patient with no risk factors for same.  She denies any IV drug use, chronic corticosteroid use.  She has not tried anything for pain.  Past Medical History / Social History / Additional history: Chart reviewed. Pertinent results include: Depression, asthma, hypertension  Physical Exam: Physical exam performed. The pertinent findings include: Patient with tenderness to palpation over the right greater trochanter, and along the IT band distribution.  She has normal strength, range of motion, some pain with active flexion.  She is neurovascularly intact throughout.  Medications / Treatment: Offered Toradol shot but patient declined at this time, encouraged ibuprofen, Tylenol, rehab exercises, and close follow-up with orthopedics   Disposition: After consideration of the diagnostic results and the patients response to treatment, I feel that Patient's symptoms are  consistent with trochanteric bursitis versus IT band syndrome, provided her with rehab exercises for both, and encouraged follow-up with orthopedics.  Encouraged rest, ice, compression, elevation, ibuprofen, Tylenol as discussed above.  She is stable for discharge at this time.  Her vital signs are stable.  I independently interpreted plain film x-ray of the right hip with no evidence of fracture, dislocation, or other abnormalities. Pregnancy test negative at this time. Marland Kitchen   emergency department workup does not suggest an emergent condition requiring admission or immediate intervention beyond what has been performed at this time. The plan is: as above. The patient is safe for discharge and has been instructed to return immediately for worsening symptoms, change in symptoms or any other concerns.  Final Clinical Impression(s) / ED Diagnoses Final diagnoses:  Trochanteric bursitis of right hip  Iliotibial band syndrome of right side    Rx / DC Orders ED Discharge Orders     None         Anselmo Pickler, PA-C 02/17/22 2018    Deno Etienne, DO 02/17/22 2021

## 2022-09-19 ENCOUNTER — Encounter: Payer: Self-pay | Admitting: Obstetrics & Gynecology

## 2022-09-19 ENCOUNTER — Other Ambulatory Visit (HOSPITAL_COMMUNITY)
Admission: RE | Admit: 2022-09-19 | Discharge: 2022-09-19 | Disposition: A | Discharge status: Home / Self Care | Payer: Medicaid Other | Source: Ambulatory Visit | Attending: Obstetrics & Gynecology | Admitting: Obstetrics & Gynecology

## 2022-09-19 ENCOUNTER — Ambulatory Visit (INDEPENDENT_AMBULATORY_CARE_PROVIDER_SITE_OTHER): Payer: Medicaid Other | Admitting: Obstetrics & Gynecology

## 2022-09-19 VITALS — BP 111/67 | HR 67 | Ht 62.5 in | Wt 220.0 lb

## 2022-09-19 DIAGNOSIS — Z113 Encounter for screening for infections with a predominantly sexual mode of transmission: Secondary | ICD-10-CM | POA: Diagnosis not present

## 2022-09-19 DIAGNOSIS — Z01419 Encounter for gynecological examination (general) (routine) without abnormal findings: Secondary | ICD-10-CM | POA: Insufficient documentation

## 2022-09-19 NOTE — Progress Notes (Signed)
GYNECOLOGY ANNUAL PREVENTATIVE CARE ENCOUNTER NOTE  History:     Kathy Flores is a 32 y.o. [phone] female here for a routine annual gynecologic exam.  Current complaints: none.   Denies abnormal vaginal bleeding, discharge, pelvic pain, problems with intercourse or other gynecologic concerns.    Gynecologic History Patient's last menstrual period was 09/19/2022. Contraception: none Last Pap: a few years ago. Result was normal   Obstetric History OB History  Gravida Para Term Preterm AB Living  4 4 4     4   SAB IAB Ectopic Multiple Live Births        0 4    # Outcome Date GA Lbr Len/2nd Weight Sex Type Anes PTL Lv  4 Term 09/28/19 [redacted]w[redacted]d 10:11 / 00:30 6 lb 2.8 oz (2.8 kg) F VBAC EPI  LIV  3 Term 11/23/13 [redacted]w[redacted]d  7 lb 10.9 oz (3.485 kg) F CS-LTranv EPI  LIV     Complications: Intraamniotic Infection  2 Term 01/30/06 [redacted]w[redacted]d  5 lb (2.268 kg) F Vag-Spont   LIV  1 Term 11/13/04 [redacted]w[redacted]d  5 lb (2.268 kg) F Vag-Spont   LIV    Past Medical History:  Diagnosis Date   Asthma    inhaler used 3x month   Depression    Fibroid    Transient hypertension of pregnancy in third trimester 09/26/2019   Trichomonas infection    Vaginal Pap smear, abnormal     Past Surgical History:  Procedure Laterality Date   CESAREAN SECTION N/A 11/23/2013   Procedure: CESAREAN SECTION;  Surgeon: Adam Phenix, MD;  Location: [facility];  Service: Obstetrics;  Laterality: N/A;    Current Outpatient Medications on File Prior to Visit  Medication Sig Dispense Refill   albuterol (PROVENTIL) (2.5 MG/3ML) 0.083% nebulizer solution Take 3 mLs (2.5 mg total) by nebulization every 6 (six) hours as needed for wheezing or shortness of breath. 75 mL 2   albuterol (PROVENTIL) (5 MG/ML) 0.5% nebulizer solution Take 0.5 mLs (2.5 mg total) by nebulization every 6 (six) hours as needed for wheezing or shortness of breath. 20 mL 1   albuterol (VENTOLIN HFA) 108 (90 Base) MCG/ACT inhaler Inhale 2 puffs into the lungs every  4 (four) hours as needed for wheezing or shortness of breath. 18 g 0   budesonide-formoterol (SYMBICORT) 160-4.5 MCG/ACT inhaler Inhale 2 puffs into the lungs in the morning and at bedtime. 1 Inhaler 12   fluticasone (FLONASE) 50 MCG/ACT nasal spray Place 2 sprays into both nostrils daily. 11.1 mL 0   predniSONE (DELTASONE) 20 MG tablet Take 2 tablets (40 mg total) by mouth daily. 10 tablet 0   acetaminophen (TYLENOL) 325 MG tablet Take 2 tablets (650 mg total) by mouth every 4 (four) hours as needed (for pain scale < 4). 30 tablet 0   amLODipine (NORVASC) 5 MG tablet Take 1 tablet (5 mg total) by mouth daily. 30 tablet 1   benzonatate (TESSALON) 200 MG capsule Take 1 capsule (200 mg total) by mouth 3 (three) times daily as needed for cough. 21 capsule 0   cyclobenzaprine (FLEXERIL) 10 MG tablet Take 1 tablet (10 mg total) by mouth 2 (two) times daily as needed for muscle spasms. 20 tablet 1   diphenhydramine-acetaminophen (TYLENOL PM) 25-500 MG TABS tablet Take 2 tablets by mouth at bedtime as needed (sleep/pain).     doxycycline (VIBRAMYCIN) 100 MG capsule Take 1 capsule (100 mg total) by mouth 2 (two) times daily. 10 capsule 0  Elastic Bandages & Supports (COMFORT FIT MATERNITY SUPP LG) MISC 1 Units by Does not apply route daily. 1 each 0   guaiFENesin 200 MG tablet Take 1 tablet (200 mg total) by mouth every 4 (four) hours as needed for cough or to loosen phlegm. 30 tablet 0   naproxen (NAPROSYN) 500 MG tablet Take 1 tablet (500 mg total) by mouth 2 (two) times daily with a meal. 20 tablet 0   norethindrone (MICRONOR) 0.35 MG tablet Take 1 tablet (0.35 mg total) by mouth daily. 30 tablet 11   Prenatal Vit-Fe Fumarate-FA (PRENATAL VITAMIN) 27-0.8 MG TABS Take 1 tablet by mouth daily. (Patient not taking: Reported on 09/29/2019) 30 tablet 12   zolpidem (AMBIEN) 5 MG tablet Take 1 tablet (5 mg total) by mouth at bedtime as needed for sleep. (Patient taking differently: Take 5 mg by mouth at bedtime.)  30 tablet 0   No current facility-administered medications on file prior to visit.    Allergies  Allergen Reactions   Keflet [Cephalexin] Itching   Penicillins Hives and Rash    Has patient had a PCN reaction causing immediate rash, facial/tongue/throat swelling, SOB or lightheadedness with hypotension: Yes Has patient had a PCN reaction causing severe rash involving mucus membranes or skin necrosis: No Has patient had a PCN reaction that required hospitalization No Has patient had a PCN reaction occurring within the last 10 years: No If all of the above answers are "NO", then may proceed with Cephalosporin use.     Social History:  reports that she has quit smoking. Her smoking use included cigarettes. She has never used smokeless tobacco. She reports that she does not drink alcohol and does not use drugs.  Family History  Problem Relation Age of Onset   Asthma Sister    Healthy Mother    Healthy Father     The following portions of the patient's history were reviewed and updated as appropriate: allergies, current medications, past family history, past medical history, past social history, past surgical history and problem list.  Review of Systems Pertinent items noted in HPI and remainder of comprehensive ROS otherwise negative.  Physical Exam:  BP 111/67   Pulse 67   Ht 5' 2.5" (1.588 m)   Wt 220 lb (99.8 kg)   LMP 09/19/2022 Comment: pt reports some spotting  BMI 39.60 kg/m  CONSTITUTIONAL: Well-developed, well-nourished female in no acute distress.  HENT:  Normocephalic, atraumatic, External right and left ear normal.  EYES: Conjunctivae and EOM are normal. Pupils are equal, round, and reactive to light. No scleral icterus.  NECK: Normal range of motion, supple, no masses.  Normal thyroid.  SKIN: Skin is warm and dry. No rash noted. Not diaphoretic. No erythema. No pallor. MUSCULOSKELETAL: Normal range of motion. No tenderness.  No cyanosis, clubbing, or  edema. NEUROLOGIC: Alert and oriented to person, place, and time. Normal reflexes, muscle tone coordination.  PSYCHIATRIC: Normal mood and affect. Normal behavior. Normal judgment and thought content. CARDIOVASCULAR: Normal heart rate noted, regular rhythm RESPIRATORY: Clear to auscultation bilaterally. Effort and breath sounds normal, no problems with respiration noted. BREASTS: Symmetric in size. No masses, tenderness, skin changes, nipple drainage, or lymphadenopathy bilaterally. Performed in the presence of a chaperone. ABDOMEN: Soft, no distention noted.  No tenderness, rebound or guarding.  PELVIC: Normal appearing external genitalia and urethral meatus; normal appearing vaginal mucosa and cervix.  Mild bleeding due to period. No abnormal vaginal discharge noted.  Pap smear obtained.  Normal uterine size, no  other palpable masses, no uterine or adnexal tenderness.  Performed in the presence of a chaperone.   Assessment and Plan:     1. Routine screening for STI (sexually transmitted infection) STI screen requested and done, will follow up results and manage accordingly. - RPR+HBsAg+HCVAb+HIV - Cytology - PAP( [place]) ancillary testing  2. Well woman exam with routine gynecological exam - Cytology - PAP( [place]) Will follow up results of pap smear and manage accordingly. Normal breast examination today, she was advised to perform periodic self breast examinations.  Routine preventative health maintenance measures emphasized. Please refer to After Visit Summary for other counseling recommendations.      Jaynie Collins, MD, FACOG Obstetrician & Gynecologist, [facility] for [employer], [facility]

## 2022-09-20 LAB — RPR+HBSAG+HCVAB+...
HIV Screen 4th Generation wRfx: NONREACTIVE
Hep C Virus Ab: NONREACTIVE
Hepatitis B Surface Ag: NEGATIVE
RPR Ser Ql: NONREACTIVE

## 2022-09-25 NOTE — Progress Notes (Deleted)
New patient visit   Patient: Kathy Flores   DOB: 03/26/1990   31 y.o. Female  MRN: [id number] Visit Date: 09/26/2022  Today's healthcare provider: Alfredia Ferguson, PA-C   No chief complaint on file.  Subjective    Kathy Flores is a 32 y.o. female who presents today as a new patient to establish care.  HPI  ***  Past Medical History:  Diagnosis Date   Asthma    inhaler used 3x month   Depression    Fibroid    Transient hypertension of pregnancy in third trimester 09/26/2019   Trichomonas infection    Vaginal Pap smear, abnormal    Past Surgical History:  Procedure Laterality Date   CESAREAN SECTION N/A 11/23/2013   Procedure: CESAREAN SECTION;  Surgeon: Adam Phenix, MD;  Location: [facility];  Service: Obstetrics;  Laterality: N/A;   Family Status  Relation Name Status   Sister  (Not Specified)   Mother  Alive   Father  Alive  No partnership data on file   Family History  Problem Relation Age of Onset   Asthma Sister    Healthy Mother    Healthy Father    Social History   Socioeconomic History   Marital status: Single    Spouse name: Not on file   Number of children: Not on file   Years of education: Not on file   Highest education level: Not on file  Occupational History   Not on file  Tobacco Use   Smoking status: Former    Types: Cigarettes   Smokeless tobacco: Never  Vaping Use   Vaping status: Never Used  Substance and Sexual Activity   Alcohol use: No   Drug use: No   Sexual activity: Yes    Birth control/protection: None  Other Topics Concern   Not on file  Social History Narrative   Not on file   Social Determinants of Health   Financial Resource Strain: Not on file  Food Insecurity: No Food Insecurity (09/09/2019)   Hunger Vital Sign    Worried About Running Out of Food in the Last Year: Never true    Ran Out of Food in the Last Year: Never true  Transportation Needs: No Transportation Needs (09/09/2019)   PRAPARE -  Administrator, Civil Service (Medical): No    Lack of Transportation (Non-Medical): No  Physical Activity: Not on file  Stress: Not on file  Social Connections: Unknown (05/19/2021)   Received from [facility], [facility]   Social Network    Social Network: Not on file   Outpatient Medications Prior to Visit  Medication Sig   acetaminophen (TYLENOL) 325 MG tablet Take 2 tablets (650 mg total) by mouth every 4 (four) hours as needed (for pain scale < 4).   albuterol (PROVENTIL) (2.5 MG/3ML) 0.083% nebulizer solution Take 3 mLs (2.5 mg total) by nebulization every 6 (six) hours as needed for wheezing or shortness of breath.   albuterol (PROVENTIL) (5 MG/ML) 0.5% nebulizer solution Take 0.5 mLs (2.5 mg total) by nebulization every 6 (six) hours as needed for wheezing or shortness of breath.   albuterol (VENTOLIN HFA) 108 (90 Base) MCG/ACT inhaler Inhale 2 puffs into the lungs every 4 (four) hours as needed for wheezing or shortness of breath.   amLODipine (NORVASC) 5 MG tablet Take 1 tablet (5 mg total) by mouth daily.   benzonatate (TESSALON) 200 MG capsule Take 1 capsule (200 mg total) by  mouth 3 (three) times daily as needed for cough.   budesonide-formoterol (SYMBICORT) 160-4.5 MCG/ACT inhaler Inhale 2 puffs into the lungs in the morning and at bedtime.   cyclobenzaprine (FLEXERIL) 10 MG tablet Take 1 tablet (10 mg total) by mouth 2 (two) times daily as needed for muscle spasms.   diphenhydramine-acetaminophen (TYLENOL PM) 25-500 MG TABS tablet Take 2 tablets by mouth at bedtime as needed (sleep/pain).   doxycycline (VIBRAMYCIN) 100 MG capsule Take 1 capsule (100 mg total) by mouth 2 (two) times daily.   Elastic Bandages & Supports (COMFORT FIT MATERNITY SUPP LG) MISC 1 Units by Does not apply route daily.   fluticasone (FLONASE) 50 MCG/ACT nasal spray Place 2 sprays into both nostrils daily.   guaiFENesin 200 MG tablet Take 1 tablet (200 mg total) by mouth every 4 (four)  hours as needed for cough or to loosen phlegm.   naproxen (NAPROSYN) 500 MG tablet Take 1 tablet (500 mg total) by mouth 2 (two) times daily with a meal.   norethindrone (MICRONOR) 0.35 MG tablet Take 1 tablet (0.35 mg total) by mouth daily.   predniSONE (DELTASONE) 20 MG tablet Take 2 tablets (40 mg total) by mouth daily.   Prenatal Vit-Fe Fumarate-FA (PRENATAL VITAMIN) 27-0.8 MG TABS Take 1 tablet by mouth daily. (Patient not taking: Reported on 09/29/2019)   zolpidem (AMBIEN) 5 MG tablet Take 1 tablet (5 mg total) by mouth at bedtime as needed for sleep. (Patient taking differently: Take 5 mg by mouth at bedtime.)   No facility-administered medications prior to visit.   Allergies  Allergen Reactions   Keflet [Cephalexin] Itching   Penicillins Hives and Rash    Has patient had a PCN reaction causing immediate rash, facial/tongue/throat swelling, SOB or lightheadedness with hypotension: Yes Has patient had a PCN reaction causing severe rash involving mucus membranes or skin necrosis: No Has patient had a PCN reaction that required hospitalization No Has patient had a PCN reaction occurring within the last 10 years: No If all of the above answers are "NO", then may proceed with Cephalosporin use.     Immunization History  Administered Date(s) Administered   PFIZER(Purple Top)SARS-COV-2 Vaccination 09/29/2019   Tdap 07/19/2019    Health Maintenance  Topic Date Due   Cervical Cancer Screening (HPV/Pap Cotest)  Never done   INFLUENZA VACCINE  Never done   COVID-19 Vaccine (2 - 2023-24 season) 09/07/2022   DTaP/Tdap/Td (2 - Td or Tdap) 07/18/2029   Hepatitis C Screening  Completed   HIV Screening  Completed   HPV VACCINES  Aged Out    Patient Care Team: Patient, No Pcp Per as PCP - General (General Practice)  Review of Systems  {Insert previous labs (optional):23779} {See past labs  Heme  Chem  Endocrine  Serology  Results Review (optional):1}   Objective    LMP  09/19/2022 Comment: pt reports some spotting {Insert last BP/Wt (optional):23777}{See vitals history (optional):1}   Physical Exam ***  Depression Screen    09/19/2022   10:11 AM 09/28/2019    8:35 AM 09/16/2019   10:54 AM 09/09/2019   10:38 AM  PHQ 2/9 Scores  PHQ - 2 Score 3 0 0 0  PHQ- 9 Score 10 0 0 0   No results found for any visits on 09/26/22.  Assessment & Plan     ***  No follow-ups on file.     {provider attestation***:1}   Alfredia Ferguson, PA-C  [facility] at [facility]  [phone] (phone) [phone] (fax)  [facility]

## 2022-09-26 ENCOUNTER — Ambulatory Visit: Payer: Medicaid Other | Admitting: Physician Assistant

## 2022-09-26 LAB — CYTOLOGY - PAP
Chlamydia: NEGATIVE
Comment: NEGATIVE
Comment: NEGATIVE
Comment: NEGATIVE
Comment: NORMAL
Diagnosis: NEGATIVE
High risk HPV: NEGATIVE
Neisseria Gonorrhea: NEGATIVE
Trichomonas: NEGATIVE

## 2022-11-20 ENCOUNTER — Encounter (HOSPITAL_BASED_OUTPATIENT_CLINIC_OR_DEPARTMENT_OTHER): Payer: Self-pay | Admitting: Emergency Medicine

## 2022-11-20 ENCOUNTER — Other Ambulatory Visit: Payer: Self-pay

## 2022-11-20 ENCOUNTER — Other Ambulatory Visit (HOSPITAL_BASED_OUTPATIENT_CLINIC_OR_DEPARTMENT_OTHER): Payer: Self-pay

## 2022-11-20 ENCOUNTER — Emergency Department (HOSPITAL_BASED_OUTPATIENT_CLINIC_OR_DEPARTMENT_OTHER)
Admission: EM | Admit: 2022-11-20 | Discharge: 2022-11-20 | Disposition: A | Discharge status: Home / Self Care | Payer: Medicaid Other | Attending: Emergency Medicine | Admitting: Emergency Medicine

## 2022-11-20 DIAGNOSIS — J040 Acute laryngitis: Secondary | ICD-10-CM | POA: Insufficient documentation

## 2022-11-20 DIAGNOSIS — J45909 Unspecified asthma, uncomplicated: Secondary | ICD-10-CM | POA: Diagnosis not present

## 2022-11-20 DIAGNOSIS — J069 Acute upper respiratory infection, unspecified: Secondary | ICD-10-CM | POA: Diagnosis not present

## 2022-11-20 DIAGNOSIS — Z8709 Personal history of other diseases of the respiratory system: Secondary | ICD-10-CM

## 2022-11-20 DIAGNOSIS — Z1152 Encounter for screening for COVID-19: Secondary | ICD-10-CM | POA: Diagnosis not present

## 2022-11-20 DIAGNOSIS — R059 Cough, unspecified: Secondary | ICD-10-CM | POA: Diagnosis present

## 2022-11-20 LAB — RESP PANEL BY RT-PCR (RSV, FLU A&B, COVID)  RVPGX2
Influenza A by PCR: NEGATIVE
Influenza B by PCR: NEGATIVE
Resp Syncytial Virus by PCR: NEGATIVE
SARS Coronavirus 2 by RT PCR: NEGATIVE

## 2022-11-20 LAB — PREGNANCY, URINE: Preg Test, Ur: NEGATIVE

## 2022-11-20 MED ORDER — BUDESONIDE-FORMOTEROL FUMARATE 160-4.5 MCG/ACT IN AERO
2.0000 | INHALATION_SPRAY | Freq: Two times a day (BID) | RESPIRATORY_TRACT | 12 refills | Status: AC
  Filled 2022-11-20: qty 10.2, 30d supply, fill #0

## 2022-11-20 MED ORDER — ALBUTEROL SULFATE HFA 108 (90 BASE) MCG/ACT IN AERS
2.0000 | INHALATION_SPRAY | RESPIRATORY_TRACT | 2 refills | Status: AC | PRN
  Filled 2022-11-20: qty 18, 25d supply, fill #0

## 2022-11-20 MED ORDER — ALBUTEROL SULFATE HFA 108 (90 BASE) MCG/ACT IN AERS
2.0000 | INHALATION_SPRAY | Freq: Once | RESPIRATORY_TRACT | Status: AC
  Administered 2022-11-20: 2 via RESPIRATORY_TRACT
  Filled 2022-11-20: qty 6.7

## 2022-11-20 MED ORDER — DEXAMETHASONE 10 MG/ML FOR PEDIATRIC ORAL USE
10.0000 mg | Freq: Once | INTRAMUSCULAR | Status: AC
  Administered 2022-11-20: 10 mg via ORAL
  Filled 2022-11-20: qty 1

## 2022-11-20 MED ORDER — AEROCHAMBER PLUS FLO-VU MEDIUM MISC
1.0000 | Freq: Once | Status: AC
  Administered 2022-11-20: 1
  Filled 2022-11-20: qty 1

## 2022-11-20 MED ORDER — DEXAMETHASONE 1 MG/ML PO CONC: 10.0000 mg | Freq: Once | ORAL | Status: DC

## 2022-11-20 NOTE — ED Provider Notes (Signed)
[place] Provider Note   CSN: [id number] Arrival date & time: 11/20/22  8469     History Asthma  MDD Tobacco use  Chief Complaint  Patient presents with   URI    Kathy Flores is a 32 y.o. female.  Patient reports that she has been congestion, cough, mucus for the last 2 days.  Has not tried any medications at home.  She has history of asthma.  Denies shortness of breath or fever.     Reports nasal congestion and voice change. Denies difficulty swallowing, or choking. Denies sore throat.  Denies vomiting, diarrhea, nausea.  Denies sick contacts. She ran out of her albuterol rescue inhaler in August when she had COVID.  Denies being on daily inhaler. Does not have PCP.  When she had albuterol at home she was having to use it twice a week even when she was not sick.    The history is provided by the patient.       Home Medications Prior to Admission medications   Medication Sig Start Date End Date Taking? Authorizing Provider  acetaminophen (TYLENOL) 325 MG tablet Take 2 tablets (650 mg total) by mouth every 4 (four) hours as needed (for pain scale < 4). 09/30/19   Levie Heritage, DO  albuterol (VENTOLIN HFA) 108 (90 Base) MCG/ACT inhaler Inhale 2 puffs into the lungs every 4 (four) hours as needed for wheezing or shortness of breath. 11/20/22   Lockie Mola, MD  amLODipine (NORVASC) 5 MG tablet Take 1 tablet (5 mg total) by mouth daily. 09/30/19   Alric Seton, MD  benzonatate (TESSALON) 200 MG capsule Take 1 capsule (200 mg total) by mouth 3 (three) times daily as needed for cough. 01/02/21   Simeon Craft, PA-C  budesonide-formoterol (SYMBICORT) 160-4.5 MCG/ACT inhaler Inhale 2 puffs into the lungs in the morning and at bedtime. 11/20/22   Lockie Mola, MD  cyclobenzaprine (FLEXERIL) 10 MG tablet Take 1 tablet (10 mg total) by mouth 2 (two) times daily as needed for muscle spasms. 08/03/19   Fair, Hoyle Sauer, MD   diphenhydramine-acetaminophen (TYLENOL PM) 25-500 MG TABS tablet Take 2 tablets by mouth at bedtime as needed (sleep/pain).    [provider]  doxycycline (VIBRAMYCIN) 100 MG capsule Take 1 capsule (100 mg total) by mouth 2 (two) times daily. 11/05/21   Gwyneth Sprout, MD  Elastic Bandages & Supports (COMFORT FIT MATERNITY SUPP LG) MISC 1 Units by Does not apply route daily. 06/20/19   Sharyon Cable, CNM  fluticasone (FLONASE) 50 MCG/ACT nasal spray Place 2 sprays into both nostrils daily. 05/31/19   Rhetta Mura, MD  guaiFENesin 200 MG tablet Take 1 tablet (200 mg total) by mouth every 4 (four) hours as needed for cough or to loosen phlegm. 07/16/21   Fayrene Helper, PA-C  naproxen (NAPROSYN) 500 MG tablet Take 1 tablet (500 mg total) by mouth 2 (two) times daily with a meal. 12/07/19   Mardella Layman, MD  norethindrone (MICRONOR) 0.35 MG tablet Take 1 tablet (0.35 mg total) by mouth daily. 09/30/19   Levie Heritage, DO  predniSONE (DELTASONE) 20 MG tablet Take 2 tablets (40 mg total) by mouth daily. 11/05/21   Gwyneth Sprout, MD  Prenatal Vit-Fe Fumarate-FA (PRENATAL VITAMIN) 27-0.8 MG TABS Take 1 tablet by mouth daily. Patient not taking: Reported on 09/29/2019 05/26/19   Aviva Signs, CNM  zolpidem (AMBIEN) 5 MG tablet Take 1 tablet (5 mg total)  by mouth at bedtime as needed for sleep. Patient taking differently: Take 5 mg by mouth at bedtime. 09/16/19   Marny Lowenstein, PA-C      Allergies    Keflet [cephalexin] and Penicillins    Review of Systems   Review of Systems  Constitutional:  Positive for fatigue. Negative for appetite change, chills and fever.  HENT:  Positive for congestion. Negative for sinus pressure, sinus pain, sore throat and trouble swallowing.   Respiratory:  Positive for cough. Negative for choking, chest tightness, shortness of breath and wheezing.   Cardiovascular:  Negative for chest pain.  Gastrointestinal:  Negative for diarrhea, nausea  and vomiting.  Musculoskeletal:  Positive for myalgias.  Neurological:  Negative for light-headedness and headaches.    Physical Exam Updated Vital Signs BP 112/70 (BP Location: Right Arm)   Pulse 81   Temp 99 F (37.2 C) (Oral)   Resp 20   Wt 100.7 kg   LMP 09/17/2022   SpO2 100%   BMI 39.96 kg/m  Physical Exam Constitutional:      General: She is not in acute distress.    Appearance: Normal appearance.  HENT:     Nose: Congestion present.     Mouth/Throat:     Mouth: Mucous membranes are moist.     Pharynx: No oropharyngeal exudate or posterior oropharyngeal erythema.     Comments: Large tongue, difficult to see bottom of uvula, voice change, no masses, discharge, or asymmetry, no drooling or difficulty swallowing on exam  Cardiovascular:     Rate and Rhythm: Normal rate and regular rhythm.     Pulses: Normal pulses.     Heart sounds: Normal heart sounds.  Pulmonary:     Effort: Pulmonary effort is normal.     Breath sounds: Stridor present. Wheezing (faint expiratory wheeze at the lung bases) present.  Abdominal:     General: Abdomen is flat.     Palpations: Abdomen is soft.     Tenderness: There is no abdominal tenderness.  Musculoskeletal:     Cervical back: No tenderness.  Lymphadenopathy:     Cervical: Cervical adenopathy (shoddy) present.  Skin:    General: Skin is warm and dry.     Capillary Refill: Capillary refill takes less than 2 seconds.  Neurological:     Mental Status: She is alert.     ED Results / Procedures / Treatments   Labs (all labs ordered are listed, but only abnormal results are displayed) Labs Reviewed  RESP PANEL BY RT-PCR (RSV, FLU A&B, COVID)  [id number]  PREGNANCY, URINE    EKG None  Radiology No results found.  Procedures Procedures    Medications Ordered in ED Medications  albuterol (VENTOLIN HFA) 108 (90 Base) MCG/ACT inhaler 2 puff (2 puffs Inhalation Given 11/20/22 0813)  AeroChamber Plus Flo-Vu Medium MISC 1 each  (1 each Other Given 11/20/22 0814)  dexamethasone (DECADRON) 10 MG/ML injection for Pediatric ORAL use 10 mg (10 mg Oral Given 11/20/22 9629)    ED Course/ Medical Decision Making/ A&P                                Medical Decision Making This patient presents to the ED for concern of cough, myalgias, congestion, this involves an extensive number of treatment options, and is a complaint that carries with it a high risk of complications and morbidity.  The differential diagnosis includes viral URI, asthma exacerbation,  pneumonia, sinusitis.    Co morbidities that complicate the patient evaluation  - Asthma    Additional history obtained: External records from outside source obtained and reviewed including previous ED notes, OBGYN notes    Cardiac Monitoring: / EKG:  The patient was maintained on a cardiac monitor.  I personally viewed and interpreted the cardiac monitored which showed an underlying rhythm of: sinus rhythm     Problem List / ED Course / Critical interventions / Medication management  Patient was afebrile and had oxygen saturation of 100% on presentation to the ED. Less concerned for a bacterial pneumonia or asthma exacerbation. She did have some wheezes at the bases and voice change concerning for possible laryngitis. Less concern for peritonsillar abscess or ludwigs given patient not having issues swallowing or tenderness.   I ordered medication including symbicort  for moderate persistent asthma in addition to albuterol rescue. Decadron once in the ED for laryngitis.   Reevaluation of the patient after these medicines showed that the patient improved I have reviewed the patients home medicines and have made adjustments as needed   Social Determinants of Health:  Decreased access to care, low SES, decreased health literacy    Test / Admission - Considered:  Did not think labs and chest xray were necessary given patient was afebrile and no focal findings on  lung exam. She improved with treatment.     Risk Prescription drug management. Diagnosis or treatment significantly limited by social determinants of health.     Final Clinical Impression(s) / ED Diagnoses Final diagnoses:  Viral URI with cough  History of asthma  Laryngitis    Rx / DC Orders ED Discharge Orders          Ordered    budesonide-formoterol (SYMBICORT) 160-4.5 MCG/ACT inhaler  2 times daily        11/20/22 0805    albuterol (VENTOLIN HFA) 108 (90 Base) MCG/ACT inhaler  Every 4 hours PRN        11/20/22 0807              Lockie Mola, MD 11/20/22 0930    Alvira Monday, MD 11/20/22 2129

## 2022-11-20 NOTE — Discharge Instructions (Addendum)
Please establish care with a primary care doctor this will help better control your asthma and for future needs to prevent worse illness. Please use  your symbicort inhaler daily and you can use albuterol as needed as well.   For your congestion you can use mucinex or flonase.

## 2022-11-20 NOTE — ED Triage Notes (Signed)
Nasal congestion , sore throat , cough , lost voice , all x 2 days .

## 2022-11-27 ENCOUNTER — Inpatient Hospital Stay (HOSPITAL_COMMUNITY)
Admission: AD | Admit: 2022-11-27 | Discharge: 2022-11-27 | Disposition: A | Discharge status: Home / Self Care | Payer: Medicaid Other | Attending: Obstetrics & Gynecology | Admitting: Obstetrics & Gynecology

## 2022-11-27 ENCOUNTER — Other Ambulatory Visit: Payer: Self-pay

## 2022-11-27 DIAGNOSIS — Z3200 Encounter for pregnancy test, result unknown: Secondary | ICD-10-CM | POA: Diagnosis present

## 2022-11-27 DIAGNOSIS — Z3202 Encounter for pregnancy test, result negative: Secondary | ICD-10-CM | POA: Diagnosis not present

## 2022-11-27 DIAGNOSIS — R350 Frequency of micturition: Secondary | ICD-10-CM | POA: Diagnosis present

## 2022-11-27 LAB — POCT PREGNANCY, URINE: Preg Test, Ur: NEGATIVE

## 2022-11-27 LAB — HCG, QUANTITATIVE, PREGNANCY: hCG, Beta Chain, Quant, S: <1 m[IU]/mL (ref <5)

## 2022-11-27 NOTE — MAU Note (Signed)
Kathy Flores is a 32 y.o. at Unknown here in MAU reporting: she hasn't a cycle since September and has pregnancy symptoms.  Denies VB or pain.  Reports +UPT.  Desires pregnancy confirmation. LMP: 09/17/2022 Onset of complaint: 2 months Pain score: 0 Vitals:   11/27/22 1832  BP: 133/78  Pulse: 75  Resp: 18  Temp: 98.4 F (36.9 C)  SpO2: 97%     FHT:NA Lab orders placed from triage:   HCG

## 2022-11-27 NOTE — MAU Provider Note (Signed)
Event Date/Time   First Provider Initiated Contact with Patient 11/27/22 1832      S Ms. Kathy Flores is a 32 y.o. [phone] patient who presents to MAU today with complaint of pregnancy symptoms.  Patient states she had positive UPT yesterday.  Patient shows picture to provider that appears to be negative, but patient states it had faint 2nd line. Patient reports LMP was 9/11 and denies vaginal bleeding, discharge, or pain.  No issues with urination, but she does report increased frequency.   O Ht 5\' 2"  (1.575 m)   Wt 102.2 kg   LMP 09/17/2022   BMI 41.23 kg/m  Physical Exam Vitals reviewed.  Constitutional:      Appearance: Normal appearance.  HENT:     Head: Normocephalic and atraumatic.  Eyes:     Conjunctiva/sclera: Conjunctivae normal.  Cardiovascular:     Rate and Rhythm: Normal rate.  Pulmonary:     Effort: Pulmonary effort is normal. No respiratory distress.  Musculoskeletal:        General: Normal range of motion.     Cervical back: Normal range of motion.  Skin:    General: Skin is warm and dry.  Neurological:     Mental Status: She is alert and oriented to person, place, and time.  Psychiatric:        Mood and Affect: Mood normal.        Behavior: Behavior normal.     A Medical screening exam complete Negative UPT Desires Pregnancy  P -Reviewed chart showing negative UPT 7 days ago. -Patient states this occurred in previous pregnancies with negative results and she was found to be 23 weeks. -Provider informs her that it is highly unlikely that hCG level was not detected on advanced pregnancy. But will credit that UPTs can have false results.  -Discussed collection of hCG today as MAU UPT is negative. Patient agreeable. -Will send results to mychart.  -Discharge from MAU in stable condition  Gerrit Heck, [facility] 11/27/2022 6:32 PM

## 2023-01-26 ENCOUNTER — Emergency Department (HOSPITAL_BASED_OUTPATIENT_CLINIC_OR_DEPARTMENT_OTHER): Payer: Medicaid Other

## 2023-01-26 ENCOUNTER — Other Ambulatory Visit: Payer: Self-pay

## 2023-01-26 ENCOUNTER — Emergency Department (HOSPITAL_BASED_OUTPATIENT_CLINIC_OR_DEPARTMENT_OTHER)
Admission: EM | Admit: 2023-01-26 | Discharge: 2023-01-26 | Disposition: A | Discharge status: Home / Self Care | Payer: Medicaid Other

## 2023-01-26 ENCOUNTER — Encounter (HOSPITAL_BASED_OUTPATIENT_CLINIC_OR_DEPARTMENT_OTHER): Payer: Self-pay | Admitting: Radiology

## 2023-01-26 DIAGNOSIS — Z7951 Long term (current) use of inhaled steroids: Secondary | ICD-10-CM | POA: Diagnosis not present

## 2023-01-26 DIAGNOSIS — B349 Viral infection, unspecified: Secondary | ICD-10-CM | POA: Insufficient documentation

## 2023-01-26 DIAGNOSIS — J45909 Unspecified asthma, uncomplicated: Secondary | ICD-10-CM | POA: Diagnosis not present

## 2023-01-26 DIAGNOSIS — Z20822 Contact with and (suspected) exposure to covid-19: Secondary | ICD-10-CM | POA: Diagnosis not present

## 2023-01-26 DIAGNOSIS — R0602 Shortness of breath: Secondary | ICD-10-CM | POA: Diagnosis present

## 2023-01-26 LAB — RESP PANEL BY RT-PCR (RSV, FLU A&B, COVID)  RVPGX2
Influenza A by PCR: NEGATIVE
Influenza B by PCR: NEGATIVE
Resp Syncytial Virus by PCR: NEGATIVE
SARS Coronavirus 2 by RT PCR: NEGATIVE

## 2023-01-26 LAB — GROUP A STREP BY PCR: Group A Strep by PCR: NOT DETECTED

## 2023-01-26 MED ORDER — ALBUTEROL SULFATE HFA 108 (90 BASE) MCG/ACT IN AERS
INHALATION_SPRAY | RESPIRATORY_TRACT | Status: AC
  Filled 2023-01-26: qty 6.7

## 2023-01-26 MED ORDER — ALBUTEROL SULFATE HFA 108 (90 BASE) MCG/ACT IN AERS: 2.0000 | INHALATION_SPRAY | RESPIRATORY_TRACT | Status: DC | PRN

## 2023-01-26 MED ORDER — PREDNISONE 10 MG PO TABS: 40.0000 mg | ORAL_TABLET | Freq: Every day | ORAL | 0 refills | Status: AC

## 2023-01-26 NOTE — Discharge Instructions (Signed)
Please follow-up with your primary doctor.  Return to emerged part immediately if develop fevers, chills, difficulty swallowing, difficulty breathing, or he develop any new or worsening symptoms that are concerning to you.

## 2023-01-26 NOTE — ED Provider Notes (Signed)
[place] [facility] Provider Note   CSN: [id number] Arrival date & time: 01/26/23  1524     History  Chief Complaint  Patient presents with   Shortness of Breath   Sore Throat    Kathy Flores is a 33 y.o. female.  33 year old female presents emergency department for viral URI symptoms for the past 4 days.  Notes congestion, rhinorrhea, sore throat, cough.  Shortness of breath.  No nausea vomiting diarrhea.  Has a history of asthma, notes some shortness of breath.  No wheezing.  Does have history of prior hospital admissions per her report.   Shortness of Breath Sore Throat Associated symptoms include shortness of breath.       Home Medications Prior to Admission medications   Medication Sig Start Date End Date Taking? Authorizing Provider  predniSONE (DELTASONE) 10 MG tablet Take 4 tablets (40 mg total) by mouth daily for 3 days. 01/26/23 01/29/23 Yes Anice Wilshire, Harmon Dun, DO  acetaminophen (TYLENOL) 325 MG tablet Take 2 tablets (650 mg total) by mouth every 4 (four) hours as needed (for pain scale < 4). 09/30/19   Levie Heritage, DO  albuterol (VENTOLIN HFA) 108 (90 Base) MCG/ACT inhaler Inhale 2 puffs into the lungs every 4 (four) hours as needed for wheezing or shortness of breath. 11/20/22   Lockie Mola, MD  budesonide-formoterol (SYMBICORT) 160-4.5 MCG/ACT inhaler Inhale 2 puffs into the lungs in the morning and at bedtime. 11/20/22   Lockie Mola, MD  diphenhydramine-acetaminophen (TYLENOL PM) 25-500 MG TABS tablet Take 2 tablets by mouth at bedtime as needed (sleep/pain).    [provider]  fluticasone (FLONASE) 50 MCG/ACT nasal spray Place 2 sprays into both nostrils daily. 05/31/19   Rhetta Mura, MD  Prenatal Vit-Fe Fumarate-FA (PRENATAL VITAMIN) 27-0.8 MG TABS Take 1 tablet by mouth daily. Patient not taking: Reported on 09/29/2019 05/26/19   Aviva Signs, CNM  zolpidem (AMBIEN) 5 MG tablet Take 1 tablet  (5 mg total) by mouth at bedtime as needed for sleep. Patient taking differently: Take 5 mg by mouth at bedtime. 09/16/19   Marny Lowenstein, PA-C      Allergies    Keflet [cephalexin] and Penicillins    Review of Systems   Review of Systems  Respiratory:  Positive for shortness of breath.     Physical Exam Updated Vital Signs BP 126/83 (BP Location: Left Arm)   Pulse 89   Temp 98 F (36.7 C)   Resp 18   Ht 5\' 3"  (1.6 m)   Wt 103.4 kg   SpO2 96%   BMI 40.39 kg/m  Physical Exam Vitals and nursing note reviewed.  Constitutional:      General: She is not in acute distress.    Appearance: She is obese. She is not toxic-appearing.  HENT:     Head: Normocephalic.     Mouth/Throat:     Mouth: Mucous membranes are moist.     Pharynx: No oropharyngeal exudate or posterior oropharyngeal erythema.  Eyes:     Conjunctiva/sclera: Conjunctivae normal.  Cardiovascular:     Rate and Rhythm: Normal rate and regular rhythm.  Pulmonary:     Effort: Pulmonary effort is normal.     Breath sounds: No wheezing.  Abdominal:     General: Abdomen is flat.  Musculoskeletal:        General: Normal range of motion.     Cervical back: Normal range of motion and neck supple.  Skin:  General: Skin is warm and dry.  Neurological:     General: No focal deficit present.     Mental Status: She is alert and oriented to person, place, and time.  Psychiatric:        Mood and Affect: Mood normal.        Behavior: Behavior normal.     ED Results / Procedures / Treatments   Labs (all labs ordered are listed, but only abnormal results are displayed) Labs Reviewed  RESP PANEL BY RT-PCR (RSV, FLU A&B, COVID)  RVPGX2  GROUP A STREP BY PCR    EKG None  Radiology DG Chest 2 View Result Date: 01/26/2023 CLINICAL DATA:  Shortness of breath for several days.  Asthma. EXAM: CHEST - 2 VIEW COMPARISON:  11/05/2021 FINDINGS: The heart size and mediastinal contours are within normal limits. Both lungs  are clear. The visualized skeletal structures are unremarkable. IMPRESSION: No active cardiopulmonary disease. Electronically Signed   By: Danae Orleans M.D.   On: 01/26/2023 17:06    Procedures Procedures    Medications Ordered in ED Medications  albuterol (VENTOLIN HFA) 108 (90 Base) MCG/ACT inhaler 2 puff (has no administration in time range)  albuterol (VENTOLIN HFA) 108 (90 Base) MCG/ACT inhaler (  Given 01/26/23 1542)    ED Course/ Medical Decision Making/ A&P                                 Medical Decision Making This is a 33 year old female presenting emergency department for viral URI symptoms.  She is afebrile nontachycardic hemodynamically stable.  Reassuring exam with no overt external bacterial source of infection.  Maintaining her airway, uvula is midline.  Low suspicion for deep space neck infection with her complaint of shortness of breath.  Strep throat was negative.  Chest x-ray negative for pneumonia.  Given constellation of symptoms and underlying asthma evaluated for viral component with flu/COVID/RSV.  However negative.  Discussed supportive care with patient.  She received albuterol here, some improvement.  Does not appear to be in respiratory distress.  Lungs are clear.  Stable for discharge at this time.  Amount and/or Complexity of Data Reviewed Radiology: ordered.  Risk Prescription drug management.         Final Clinical Impression(s) / ED Diagnoses Final diagnoses:  Viral syndrome    Rx / DC Orders ED Discharge Orders          Ordered    predniSONE (DELTASONE) 10 MG tablet  Daily        01/26/23 1917              Coral Spikes, [place] 01/26/23 2224

## 2023-01-26 NOTE — ED Triage Notes (Signed)
Pt states she has had shortness of breath since Friday with congestion. Pt states she has asthma. Pt states she has an inhaler at home and has had no relief. Pt also states her throat is sore.

## 2023-03-21 ENCOUNTER — Other Ambulatory Visit: Payer: Self-pay

## 2023-03-21 ENCOUNTER — Encounter (HOSPITAL_BASED_OUTPATIENT_CLINIC_OR_DEPARTMENT_OTHER): Payer: Self-pay | Admitting: Emergency Medicine

## 2023-03-21 ENCOUNTER — Emergency Department (HOSPITAL_BASED_OUTPATIENT_CLINIC_OR_DEPARTMENT_OTHER)
Admission: EM | Admit: 2023-03-21 | Discharge: 2023-03-21 | Disposition: A | Discharge status: Home / Self Care | Attending: Emergency Medicine | Admitting: Emergency Medicine

## 2023-03-21 DIAGNOSIS — R42 Dizziness and giddiness: Secondary | ICD-10-CM | POA: Insufficient documentation

## 2023-03-21 DIAGNOSIS — R509 Fever, unspecified: Secondary | ICD-10-CM | POA: Diagnosis present

## 2023-03-21 DIAGNOSIS — Z7951 Long term (current) use of inhaled steroids: Secondary | ICD-10-CM | POA: Diagnosis not present

## 2023-03-21 DIAGNOSIS — Z79899 Other long term (current) drug therapy: Secondary | ICD-10-CM | POA: Diagnosis not present

## 2023-03-21 DIAGNOSIS — B349 Viral infection, unspecified: Secondary | ICD-10-CM | POA: Insufficient documentation

## 2023-03-21 DIAGNOSIS — Z20822 Contact with and (suspected) exposure to covid-19: Secondary | ICD-10-CM | POA: Diagnosis not present

## 2023-03-21 DIAGNOSIS — R1084 Generalized abdominal pain: Secondary | ICD-10-CM | POA: Diagnosis not present

## 2023-03-21 DIAGNOSIS — J45909 Unspecified asthma, uncomplicated: Secondary | ICD-10-CM | POA: Diagnosis not present

## 2023-03-21 LAB — RESP PANEL BY RT-PCR (RSV, FLU A&B, COVID)  RVPGX2
Influenza A by PCR: NEGATIVE
Influenza B by PCR: NEGATIVE
Resp Syncytial Virus by PCR: NEGATIVE
SARS Coronavirus 2 by RT PCR: NEGATIVE

## 2023-03-21 MED ORDER — ACETAMINOPHEN 500 MG PO TABS
1000.0000 mg | ORAL_TABLET | Freq: Once | ORAL | Status: AC
  Administered 2023-03-21: 1000 mg via ORAL
  Filled 2023-03-21: qty 2

## 2023-03-21 NOTE — ED Triage Notes (Signed)
 Pt with fever and chills, generally not feeling well since yesterday

## 2023-03-21 NOTE — ED Notes (Signed)
 No nausea at present, pt advised been sick since yesterday. Fever has broken but pt has minor dizziness with standing. Orthostatics as shown.

## 2023-03-21 NOTE — Discharge Instructions (Addendum)
 As discussed, your swabs came back negative for flu, COVID, and RSV.  But, you most likely have another viral illness that is causing your symptoms.  Alternate between Ibuprofen 600 mg and Tylenol 500 mg every 4 hours for fevers and headaches.  Make sure you are drinking plenty of fluids, it is okay if you are not hungry as long as you are drinking drinks with electrolytes - like Gatorade and Pedialyte.  Get help right away if: You have trouble breathing. You have a severe headache or a stiff neck. You have severe vomiting or pain in your abdomen.

## 2023-03-21 NOTE — ED Notes (Signed)

## 2023-03-21 NOTE — ED Provider Notes (Signed)
 [place] [facility] Provider Note   CSN: [id number] Arrival date & time: 03/21/23  1719     History  Chief Complaint  Patient presents with   Fever    Kathy Flores is a 33 y.o. female with a history of asthma who presents the ED today for fever.  Patient reports fever and chills since yesterday with associated dizziness with standing.  Patient states that she has decreased appetite but is trying to eat and drink as normal.  Denies associated nausea, vomiting, or diarrhea.  No cough, chest pain, shortness of breath.  No known sick contact.    Patient does endorse generalized abdominal pain which she states is, for her to get me before her menstrual cycle.  She states that these pains feel the same as that.  She has been taking Ibuprofen with improvement of her symptoms.  Denies any additional complaints or concerns at this time.    Home Medications Prior to Admission medications   Medication Sig Start Date End Date Taking? Authorizing Provider  acetaminophen (TYLENOL) 325 MG tablet Take 2 tablets (650 mg total) by mouth every 4 (four) hours as needed (for pain scale < 4). 09/30/19   Levie Heritage, DO  albuterol (VENTOLIN HFA) 108 (90 Base) MCG/ACT inhaler Inhale 2 puffs into the lungs every 4 (four) hours as needed for wheezing or shortness of breath. 11/20/22   Lockie Mola, MD  budesonide-formoterol (SYMBICORT) 160-4.5 MCG/ACT inhaler Inhale 2 puffs into the lungs in the morning and at bedtime. 11/20/22   Lockie Mola, MD  diphenhydramine-acetaminophen (TYLENOL PM) 25-500 MG TABS tablet Take 2 tablets by mouth at bedtime as needed (sleep/pain).    [provider]  fluticasone (FLONASE) 50 MCG/ACT nasal spray Place 2 sprays into both nostrils daily. 05/31/19   Rhetta Mura, MD  Prenatal Vit-Fe Fumarate-FA (PRENATAL VITAMIN) 27-0.8 MG TABS Take 1 tablet by mouth daily. Patient not taking: Reported on 09/29/2019 05/26/19    Aviva Signs, CNM  zolpidem (AMBIEN) 5 MG tablet Take 1 tablet (5 mg total) by mouth at bedtime as needed for sleep. Patient taking differently: Take 5 mg by mouth at bedtime. 09/16/19   Marny Lowenstein, PA-C      Allergies    Keflet [cephalexin] and Penicillins    Review of Systems   Review of Systems  Constitutional:  Positive for fever.  All other systems reviewed and are negative.   Physical Exam Updated Vital Signs BP 107/77   Pulse (!) 113   Temp 99.9 F (37.7 C) (Oral)   Resp 18   Ht 5\' 2"  (1.575 m)   Wt 101.2 kg   LMP 02/26/2023   SpO2 99%   BMI 40.79 kg/m  Physical Exam Vitals and nursing note reviewed.  Constitutional:      General: She is not in acute distress.    Appearance: Normal appearance.  HENT:     Head: Normocephalic and atraumatic.     Mouth/Throat:     Mouth: Mucous membranes are moist.  Eyes:     Conjunctiva/sclera: Conjunctivae normal.     Pupils: Pupils are equal, round, and reactive to light.  Cardiovascular:     Rate and Rhythm: Normal rate and regular rhythm.     Pulses: Normal pulses.     Heart sounds: Normal heart sounds.  Pulmonary:     Effort: Pulmonary effort is normal.     Breath sounds: Normal breath sounds.  Abdominal:  Palpations: Abdomen is soft.     Tenderness: There is no abdominal tenderness.  Musculoskeletal:        General: Normal range of motion.     Cervical back: Normal range of motion.  Skin:    General: Skin is warm and dry.     Findings: No rash.  Neurological:     General: No focal deficit present.     Mental Status: She is alert.  Psychiatric:        Mood and Affect: Mood normal.        Behavior: Behavior normal.     Orthostatic Lying BP- Lying: 115/71 (1927) Pulse- Lying: 95 Orthostatic Sitting BP- Sitting: 106/69 (1928.5) Pulse- Sitting: 110 Orthostatic Standing at 0 minutes BP- Standing at 0 minutes: 107/77 (1930) Pulse- Standing at 0 minutes: 121 Orthostatic Standing at 3 minutes BP-  Standing at 3 minutes: 95/73 (1933) Pulse- Standing at 3 minutes: 114   ED Results / Procedures / Treatments   Labs (all labs ordered are listed, but only abnormal results are displayed) Labs Reviewed  RESP PANEL BY RT-PCR (RSV, FLU A&B, COVID)  RVPGX2    EKG None  Radiology No results found.  Procedures Procedures: not indicated.   Medications Ordered in ED Medications  acetaminophen (TYLENOL) tablet 1,000 mg (1,000 mg Oral Given 03/21/23 1735)    ED Course/ Medical Decision Making/ A&P                                 Medical Decision Making Risk OTC drugs.   This patient presents to the ED for concern of fever, this involves an extensive number of treatment options, and is a complaint that carries with it a high risk of complications and morbidity.   Differential diagnosis includes: Flu, COVID, RSV, other viral illness, etc.   Comorbidities  See HPI above   Additional History  Additional history obtained from patient   Lab Tests  I ordered and personally interpreted labs.  The pertinent results include:   Negative respiratory panel   Problem List / ED Course / Critical Interventions / Medication Management  Patient reports fever and chills for the past 2 days.  She has not been taking anything for symptoms at home.  Denies any known sick contact. She does have some dizziness with standing.  Orthostatic vital signs were obtained and were positive for orthostatic hypotension. Endorses abdominal pain for the past 3 days.  States that she gets pain like this when she is about to start her period.  She has been taking ibuprofen for her abdominal pain with improvement of symptoms. Denies nausea, vomiting, diarrhea, or dysuria. Tylenol was given in triage for fever with improvement of symptoms.   Declines any medication for abdominal pain at this time.  States that her ibuprofen that she takes at home works just fine. I have reviewed the patients home  medicines and have made adjustments as needed Oral hydration was given to patient.  She states that this has helped her feel better. Advised to maintain hydration and alternate between OTC antipyretics every 4 hours.   Social Determinants of Health  Access to healthcare   Test / Admission - Considered  Patient is stable and safe for discharge home. Return precautions given.       Final Clinical Impression(s) / ED Diagnoses Final diagnoses:  Viral illness    Rx / DC Orders ED Discharge Orders  None         Maxwell Marion, PA-C 03/21/23 2017    Loetta Rough, MD 03/22/23 [phone]

## 2023-07-06 ENCOUNTER — Other Ambulatory Visit: Payer: Self-pay

## 2023-07-06 MED ORDER — PHENAZOPYRIDINE HCL 200 MG PO TABS
200.0000 mg | ORAL_TABLET | Freq: Three times a day (TID) | ORAL | 0 refills | Status: DC
  Filled 2023-07-06: qty 6, 2d supply, fill #0

## 2023-07-06 MED ORDER — NITROFURANTOIN MONOHYD MACRO 100 MG PO CAPS
100.0000 mg | ORAL_CAPSULE | Freq: Two times a day (BID) | ORAL | 0 refills | Status: DC
  Filled 2023-07-06: qty 10, 5d supply, fill #0

## 2023-07-07 ENCOUNTER — Other Ambulatory Visit: Payer: Self-pay

## 2023-10-16 ENCOUNTER — Other Ambulatory Visit: Payer: Self-pay

## 2023-10-16 MED ORDER — METRONIDAZOLE 500 MG PO TABS
500.0000 mg | ORAL_TABLET | Freq: Two times a day (BID) | ORAL | 0 refills | Status: DC
  Filled 2023-10-16: qty 14, 7d supply, fill #0

## 2023-10-22 ENCOUNTER — Other Ambulatory Visit: Payer: Self-pay

## 2023-10-27 ENCOUNTER — Other Ambulatory Visit: Payer: Self-pay

## 2023-12-18 ENCOUNTER — Ambulatory Visit (HOSPITAL_COMMUNITY)
Admission: EM | Admit: 2023-12-18 | Discharge: 2023-12-18 | Disposition: A | Discharge status: Home / Self Care | Attending: Physician Assistant | Admitting: Physician Assistant

## 2023-12-18 ENCOUNTER — Encounter (HOSPITAL_COMMUNITY): Payer: Self-pay

## 2023-12-18 DIAGNOSIS — J040 Acute laryngitis: Secondary | ICD-10-CM | POA: Diagnosis not present

## 2023-12-18 DIAGNOSIS — Z8709 Personal history of other diseases of the respiratory system: Secondary | ICD-10-CM

## 2023-12-18 DIAGNOSIS — J069 Acute upper respiratory infection, unspecified: Secondary | ICD-10-CM

## 2023-12-18 DIAGNOSIS — R051 Acute cough: Secondary | ICD-10-CM

## 2023-12-18 LAB — POC COVID19/FLU A&B COMBO
Covid Antigen, POC: NEGATIVE
Influenza A Antigen, POC: NEGATIVE
Influenza B Antigen, POC: NEGATIVE

## 2023-12-18 LAB — POCT RAPID STREP A (OFFICE): Rapid Strep A Screen: NEGATIVE

## 2023-12-18 LAB — POCT URINE PREGNANCY: Preg Test, Ur: NEGATIVE

## 2023-12-18 MED ORDER — ACETAMINOPHEN 325 MG PO TABS
975.0000 mg | ORAL_TABLET | Freq: Once | ORAL | Status: AC
  Administered 2023-12-18: 975 mg via ORAL

## 2023-12-18 MED ORDER — PROMETHAZINE-DM 6.25-15 MG/5ML PO SYRP: 5.0000 mL | ORAL_SOLUTION | Freq: Two times a day (BID) | ORAL | 0 refills | Status: AC | PRN

## 2023-12-18 MED ORDER — ACETAMINOPHEN 325 MG PO TABS
ORAL_TABLET | ORAL | Status: AC
  Filled 2023-12-18: qty 3

## 2023-12-18 MED ORDER — PREDNISONE 20 MG PO TABS: 40.0000 mg | ORAL_TABLET | Freq: Every day | ORAL | 0 refills | Status: AC

## 2023-12-18 NOTE — Discharge Instructions (Signed)
 You are negative for COVID, flu, strep.  I am sending your throat culture off and if this grows any bacteria I will let you know.  I believe that you have a virus.  Start prednisone  to help with your symptoms and asthma.  Do not take NSAIDs with this medication including aspirin , ibuprofen /Advil , naproxen /Aleve .  I recommend over-the-counter medication including Tylenol , Mucinex , fluticasone  nasal spray, nasal saline/sinus rinses.  I also recommend a humidifier in your room.  Use Promethazine DM for cough.  This will make you sleepy so do not drive or drink alcohol while taking it.  If you are not feeling better within a week please come back for reevaluation.  If anything worsens and you have high fever, worsening cough, shortness of breath despite your medication, weakness you need to be seen immediately.

## 2023-12-18 NOTE — ED Triage Notes (Signed)
 Pt states sore throat,fever and body aches since yesterday. Pt denies taking anything at home for her symptoms.

## 2023-12-18 NOTE — ED Provider Notes (Signed)
 [facility]    CSN: [id number] Arrival date & time: 12/18/23  9063      History   Chief Complaint Chief Complaint  Patient presents with   Sore Throat    HPI Kathy Flores is a 33 y.o. female.   Patient presents today companied by her boyfriend.  She reports 24-hour history of URI symptoms including body aches, sore throat, cough, congestion, rhinorrhea.  She has not recorded a fever and denies any chest pain, shortness of breath, nausea, vomiting, diarrhea.  She has not taken any over-the-counter medications for symptom management.  She does have a history of asthma but has not required use of her asthma medication and denies hospitalization related to asthma.  Denies any recent antibiotics or steroids.  She denies any known sick contacts.  She has had COVID with last episode in May 2025.  She has had COVID-19 vaccines.  She is having difficulty with her daily activities as a result of symptoms.  She is unsure if she could be pregnant.    Past Medical History:  Diagnosis Date   Asthma    inhaler used 3x month   Depression    Fibroid    Transient hypertension of pregnancy in third trimester 09/26/2019   Trichomonas infection    Vaginal Pap smear, abnormal     Patient Active Problem List   Diagnosis Date Noted   BMI 40.0-44.9, adult (HCC) 09/26/2019   Gestational hypertension 09/26/2019   Alpha thalassemia silent carrier 09/06/2019   Carrier of fragile X syndrome 09/06/2019   Primary insomnia 07/19/2019   Bipolar depression (HCC) 06/20/2019   History of cesarean section complicating pregnancy 06/20/2019   Moderate persistent asthma with exacerbation    Normochromic normocytic anemia 09/23/2015   Uterine fibroid in antepartum period 05/05/2013    Past Surgical History:  Procedure Laterality Date   CESAREAN SECTION N/A 11/23/2013   Procedure: CESAREAN SECTION;  Surgeon: Lynwood KANDICE Solomons, MD;  Location: [facility];  Service: Obstetrics;  Laterality: N/A;     OB History     Gravida  4   Para  4   Term  4   Preterm      AB      Living  4      SAB      IAB      Ectopic      Multiple  0   Live Births  4            Home Medications    Prior to Admission medications  Medication Sig Start Date End Date Taking? Authorizing Provider  predniSONE  (DELTASONE ) 20 MG tablet Take 2 tablets (40 mg total) by mouth daily for 5 days. 12/18/23 12/23/23 Yes Julez Huseby K, PA-C  promethazine-dextromethorphan  (PROMETHAZINE-DM) 6.25-15 MG/5ML syrup Take 5 mLs by mouth 2 (two) times daily as needed for cough. 12/18/23  Yes Jaella Weinert K, PA-C  acetaminophen  (TYLENOL ) 325 MG tablet Take 2 tablets (650 mg total) by mouth every 4 (four) hours as needed (for pain scale < 4). 09/30/19   Stinson, Jacob J, DO  albuterol  (VENTOLIN  HFA) 108 (90 Base) MCG/ACT inhaler Inhale 2 puffs into the lungs every 4 (four) hours as needed for wheezing or shortness of breath. 11/20/22   Nicholas Bar, MD  budesonide -formoterol  (SYMBICORT ) 160-4.5 MCG/ACT inhaler Inhale 2 puffs into the lungs in the morning and at bedtime. 11/20/22   Nicholas Bar, MD  diphenhydramine -acetaminophen  (TYLENOL  PM) 25-500 MG TABS tablet Take 2 tablets by mouth  at bedtime as needed (sleep/pain).    [provider]  fluticasone  (FLONASE ) 50 MCG/ACT nasal spray Place 2 sprays into both nostrils daily. 05/31/19   Samtani, Jai-Gurmukh, MD  Prenatal Vit-Fe Fumarate-FA (PRENATAL VITAMIN) 27-0.8 MG TABS Take 1 tablet by mouth daily. Patient not taking: Reported on 09/29/2019 05/26/19   Trudy Earnie CROME, CNM  zolpidem  (AMBIEN ) 5 MG tablet Take 1 tablet (5 mg total) by mouth at bedtime as needed for sleep. Patient taking differently: Take 5 mg by mouth at bedtime. 09/16/19   Larwence Mliss SAILOR, PA-C    Family History Family History  Problem Relation Age of Onset   Asthma Sister    Healthy Mother    Healthy Father     Social History Social History[1]   Allergies   Keflet  [cephalexin] and Penicillins   Review of Systems Review of Systems  Constitutional:  Negative for activity change, appetite change, fatigue and fever.  HENT:  Positive for congestion, postnasal drip and sore throat. Negative for sinus pressure and sneezing.   Respiratory:  Positive for cough. Negative for shortness of breath.   Cardiovascular:  Negative for chest pain.  Gastrointestinal:  Negative for abdominal pain, diarrhea, nausea and vomiting.  Musculoskeletal:  Positive for arthralgias and myalgias.     Physical Exam Triage Vital Signs ED Triage Vitals  Encounter Vitals Group     BP 12/18/23 1014 128/69     Girls Systolic BP Percentile --      Girls Diastolic BP Percentile --      Boys Systolic BP Percentile --      Boys Diastolic BP Percentile --      Pulse Rate 12/18/23 1014 87     Resp 12/18/23 1014 16     Temp 12/18/23 1014 98.5 F (36.9 C)     Temp Source 12/18/23 1014 Oral     SpO2 12/18/23 1014 97 %     Weight --      Height --      Head Circumference --      Peak Flow --      Pain Score 12/18/23 1012 10     Pain Loc --      Pain Education --      Exclude from Growth Chart --    No data found.  Updated Vital Signs BP 128/69 (BP Location: Left Arm)   Pulse 87   Temp 98.5 F (36.9 C) (Oral)   Resp 16   LMP 10/15/2023 (Exact Date)   SpO2 97%   Visual Acuity Right Eye Distance:   Left Eye Distance:   Bilateral Distance:    Right Eye Near:   Left Eye Near:    Bilateral Near:     Physical Exam Vitals reviewed.  Constitutional:      General: She is awake. She is not in acute distress.    Appearance: Normal appearance. She is well-developed. She is not ill-appearing.     Comments: Very pleasant female appears stated age in no acute distress sitting comfortably in exam room  HENT:     Head: Normocephalic and atraumatic.     Right Ear: External ear normal. There is impacted cerumen.     Left Ear: Tympanic membrane, ear canal and external ear  normal. Tympanic membrane is not erythematous or bulging.     Ears:     Comments: Right ear: Cerumen noted; unable to visualize approximately 20% of TM that appears normal.    Nose:  Right Sinus: No maxillary sinus tenderness or frontal sinus tenderness.     Left Sinus: No maxillary sinus tenderness or frontal sinus tenderness.     Mouth/Throat:     Pharynx: Uvula midline. Posterior oropharyngeal erythema present. No oropharyngeal exudate.  Cardiovascular:     Rate and Rhythm: Normal rate and regular rhythm.     Heart sounds: Normal heart sounds, S1 normal and S2 normal. No murmur heard. Pulmonary:     Effort: Pulmonary effort is normal.     Breath sounds: Normal breath sounds. No wheezing, rhonchi or rales.     Comments: Clear to auscultation bilaterally Psychiatric:        Behavior: Behavior is cooperative.      UC Treatments / Results  Labs (all labs ordered are listed, but only abnormal results are displayed) Labs Reviewed  POCT RAPID STREP A (OFFICE) - Normal  POC COVID19/FLU A&B COMBO - Normal  POCT URINE PREGNANCY - Normal  CULTURE, GROUP A STREP [facility])    EKG   Radiology No results found.  Procedures Procedures (including critical care time)  Medications Ordered in UC Medications  acetaminophen  (TYLENOL ) tablet 975 mg (975 mg Oral Given 12/18/23 1030)    Initial Impression / Assessment and Plan / UC Course  I have reviewed the triage vital signs and the nursing notes.  Pertinent labs & imaging results that were available during my care of the patient were reviewed by me and considered in my medical decision making (see chart for details).     Patient is well-appearing, afebrile, nontoxic, nontachycardic.  No evidence of acute infection on physical exam that warrant initiation of antibiotics.  She was negative for COVID and flu.  Strep testing was obtained by nursing staff and was negative.  Will send this for culture but defer antibiotics until culture  results are available.  Will start prednisone  burst of 40 mg for 5 days to help with acute symptoms as well as potential beginning of asthma exacerbation.  We discussed that she is not to take NSAIDs with this medication and risk of GI bleeding.  Reports she has plenty of her asthma medication and did not require refill.  She was given Promethazine DM for cough and we discussed that this can be sedating and she is not to drive or drink alcohol with taking it.  Recommended over-the-counter medication including Mucinex , Flonase , nasal saline/sinus rinses.  If she is not feeling better within a week or if anything worsens she needs to be seen immediately.  Strict return precautions given.  Excuse note provided.  Final Clinical Impressions(s) / UC Diagnoses   Final diagnoses:  Acute cough  Viral URI with cough  Laryngitis  History of asthma     Discharge Instructions      You are negative for COVID, flu, strep.  I am sending your throat culture off and if this grows any bacteria I will let you know.  I believe that you have a virus.  Start prednisone  to help with your symptoms and asthma.  Do not take NSAIDs with this medication including aspirin , ibuprofen /Advil , naproxen /Aleve .  I recommend over-the-counter medication including Tylenol , Mucinex , fluticasone  nasal spray, nasal saline/sinus rinses.  I also recommend a humidifier in your room.  Use Promethazine DM for cough.  This will make you sleepy so do not drive or drink alcohol while taking it.  If you are not feeling better within a week please come back for reevaluation.  If anything worsens and you have high  fever, worsening cough, shortness of breath despite your medication, weakness you need to be seen immediately.     ED Prescriptions     Medication Sig Dispense Auth. Provider   promethazine-dextromethorphan  (PROMETHAZINE-DM) 6.25-15 MG/5ML syrup Take 5 mLs by mouth 2 (two) times daily as needed for cough. 118 mL Markcus Lazenby K, PA-C    predniSONE  (DELTASONE ) 20 MG tablet Take 2 tablets (40 mg total) by mouth daily for 5 days. 10 tablet Cathlene Gardella K, PA-C      PDMP not reviewed this encounter.     [1]  Social History Tobacco Use   Smoking status: Former    Types: Cigarettes   Smokeless tobacco: Never  Vaping Use   Vaping status: Never Used  Substance Use Topics   Alcohol use: No   Drug use: No     Sherrell Rocky POUR, PA-C 12/18/23 1102

## 2023-12-21 ENCOUNTER — Ambulatory Visit (HOSPITAL_COMMUNITY): Payer: Self-pay

## 2023-12-21 LAB — CULTURE, GROUP A STREP (THRC)
